# Patient Record
Sex: Female | Born: 1960 | Race: White | Hispanic: No | Smoking: Former smoker
Health system: Southern US, Community
[De-identification: ages and names within clinical notes are randomized; demographics above are authoritative.]

## PROBLEM LIST (undated history)

## (undated) DIAGNOSIS — E039 Hypothyroidism, unspecified: Secondary | ICD-10-CM

## (undated) DIAGNOSIS — F32A Depression, unspecified: Secondary | ICD-10-CM

## (undated) DIAGNOSIS — F3181 Bipolar II disorder: Secondary | ICD-10-CM

## (undated) DIAGNOSIS — M25511 Pain in right shoulder: Secondary | ICD-10-CM

## (undated) DIAGNOSIS — F319 Bipolar disorder, unspecified: Secondary | ICD-10-CM

## (undated) DIAGNOSIS — M542 Cervicalgia: Secondary | ICD-10-CM

## (undated) DIAGNOSIS — I10 Essential (primary) hypertension: Secondary | ICD-10-CM

## (undated) DIAGNOSIS — F419 Anxiety disorder, unspecified: Secondary | ICD-10-CM

## (undated) DIAGNOSIS — M25512 Pain in left shoulder: Secondary | ICD-10-CM

## (undated) DIAGNOSIS — K760 Fatty (change of) liver, not elsewhere classified: Secondary | ICD-10-CM

## (undated) DIAGNOSIS — R6889 Other general symptoms and signs: Secondary | ICD-10-CM

## (undated) DIAGNOSIS — K219 Gastro-esophageal reflux disease without esophagitis: Secondary | ICD-10-CM

## (undated) DIAGNOSIS — R740 Nonspecific elevation of levels of transaminase and lactic acid dehydrogenase [LDH]: Secondary | ICD-10-CM

## (undated) DIAGNOSIS — Z7689 Persons encountering health services in other specified circumstances: Secondary | ICD-10-CM

## (undated) DIAGNOSIS — G8929 Other chronic pain: Secondary | ICD-10-CM

## (undated) DIAGNOSIS — F329 Major depressive disorder, single episode, unspecified: Secondary | ICD-10-CM

## (undated) DIAGNOSIS — R7401 Elevation of levels of liver transaminase levels: Secondary | ICD-10-CM

## (undated) DIAGNOSIS — Z1331 Encounter for screening for depression: Secondary | ICD-10-CM

## (undated) DIAGNOSIS — K2970 Gastritis, unspecified, without bleeding: Secondary | ICD-10-CM

## (undated) HISTORY — DX: Pain in left shoulder: M25.512

## (undated) HISTORY — PX: OTHER SURGICAL HISTORY: SHX169

## (undated) HISTORY — DX: Bipolar disorder, unspecified: F31.9

## (undated) HISTORY — PX: NECK SURGERY: SHX720

## (undated) HISTORY — DX: Pain in right shoulder: M25.511

## (undated) HISTORY — DX: Hypothyroidism, unspecified: E03.9

## (undated) HISTORY — DX: Anxiety disorder, unspecified: F41.9

## (undated) HISTORY — DX: Gastritis, unspecified, without bleeding: K29.70

## (undated) HISTORY — DX: Other chronic pain: G89.29

## (undated) HISTORY — DX: Elevation of levels of liver transaminase levels: R74.01

## (undated) HISTORY — DX: Other general symptoms and signs: R68.89

## (undated) HISTORY — DX: Persons encountering health services in other specified circumstances: Z76.89

## (undated) HISTORY — DX: Nonspecific elevation of levels of transaminase and lactic acid dehydrogenase (ldh): R74.0

## (undated) HISTORY — DX: Bipolar II disorder: F31.81

## (undated) HISTORY — DX: Fatty (change of) liver, not elsewhere classified: K76.0

## (undated) HISTORY — DX: Cervicalgia: M54.2

## (undated) HISTORY — DX: Essential (primary) hypertension: I10

## (undated) HISTORY — DX: Encounter for screening for depression: Z13.31

---
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
## 2003-10-24 IMAGING — CR DG CHEST 2V
2 series · 2 of 2 positions shown · non-contrast
Comparison: none

CLINICAL DATA: Pre-op for ACDF.
 TWO VIEW CHEST   
 No prior studies for comparison.
 The heart size and contour are within normal limits.  Indistinct right heart border on the PA film is likely due to chronic change.  No active disease apparent on the lateral view.  
 The lungs are hyperaerated.  
 IMPRESSION
 Chronic lung changes ? no acute process.

[view not recorded (1 of 2)]
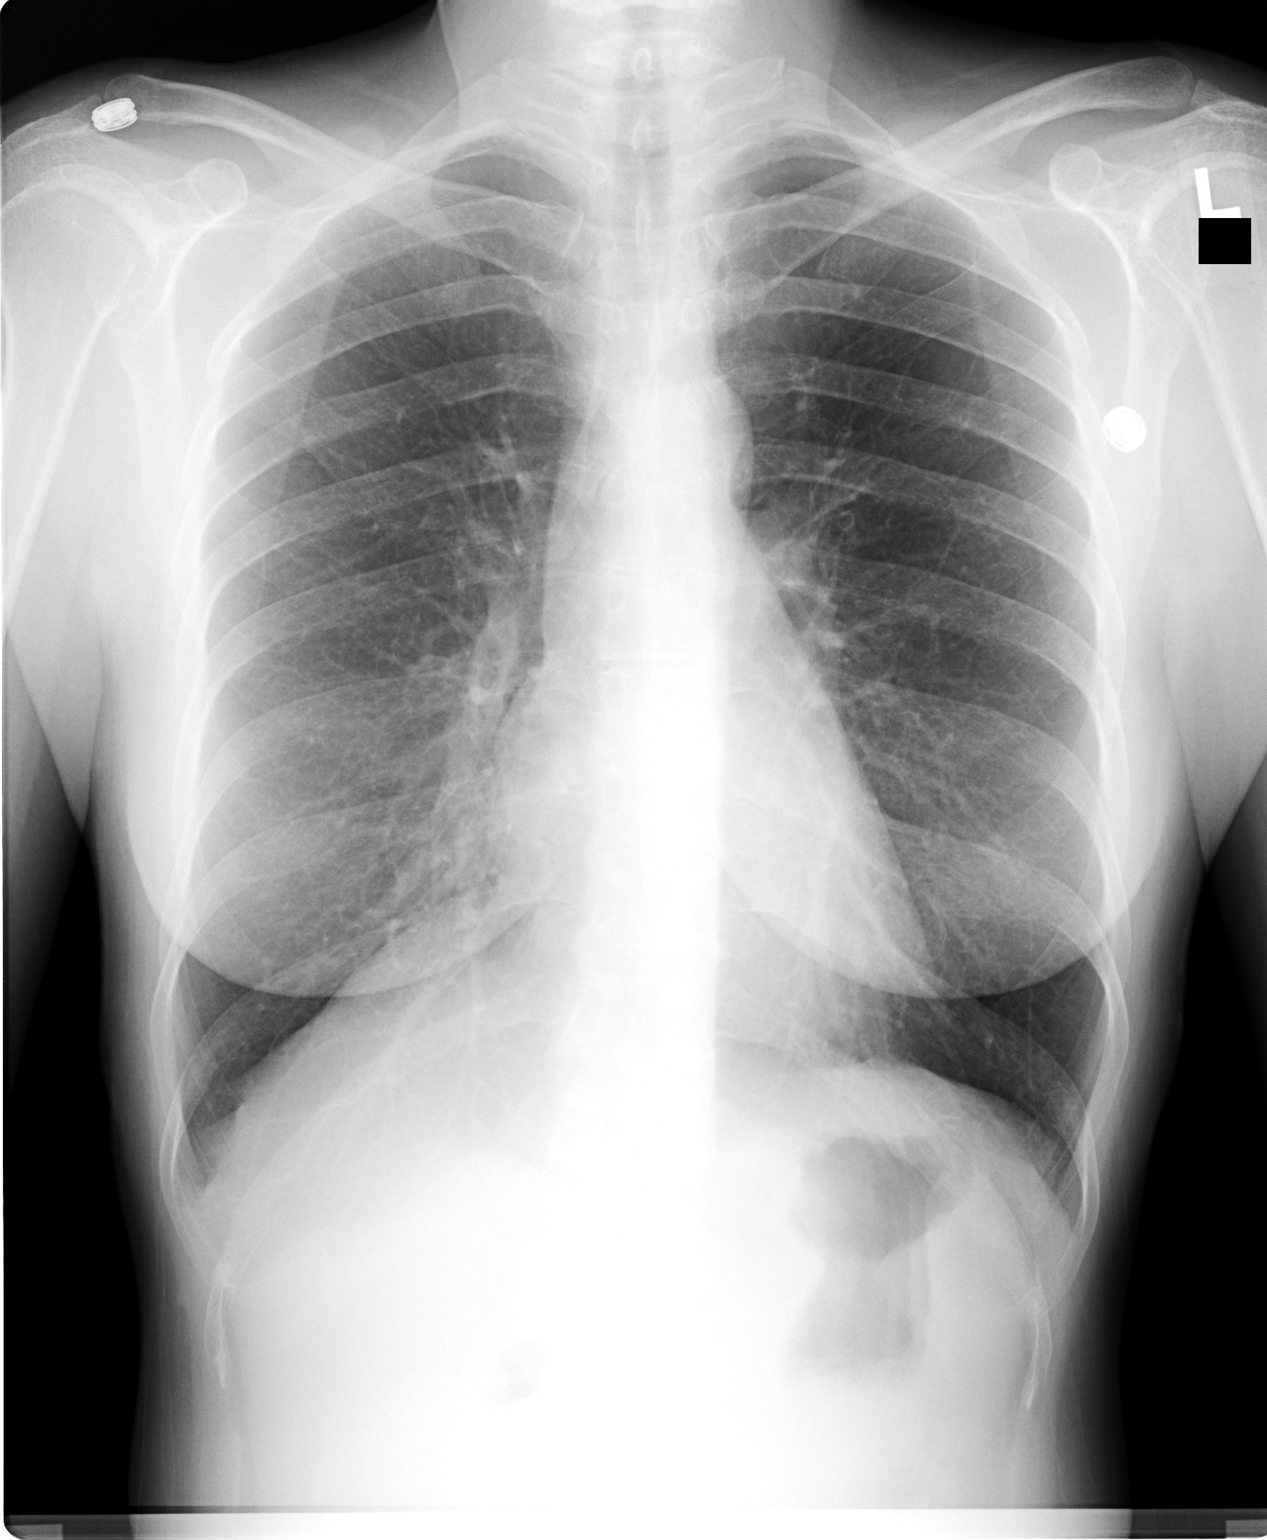

[view not recorded (2 of 2)]
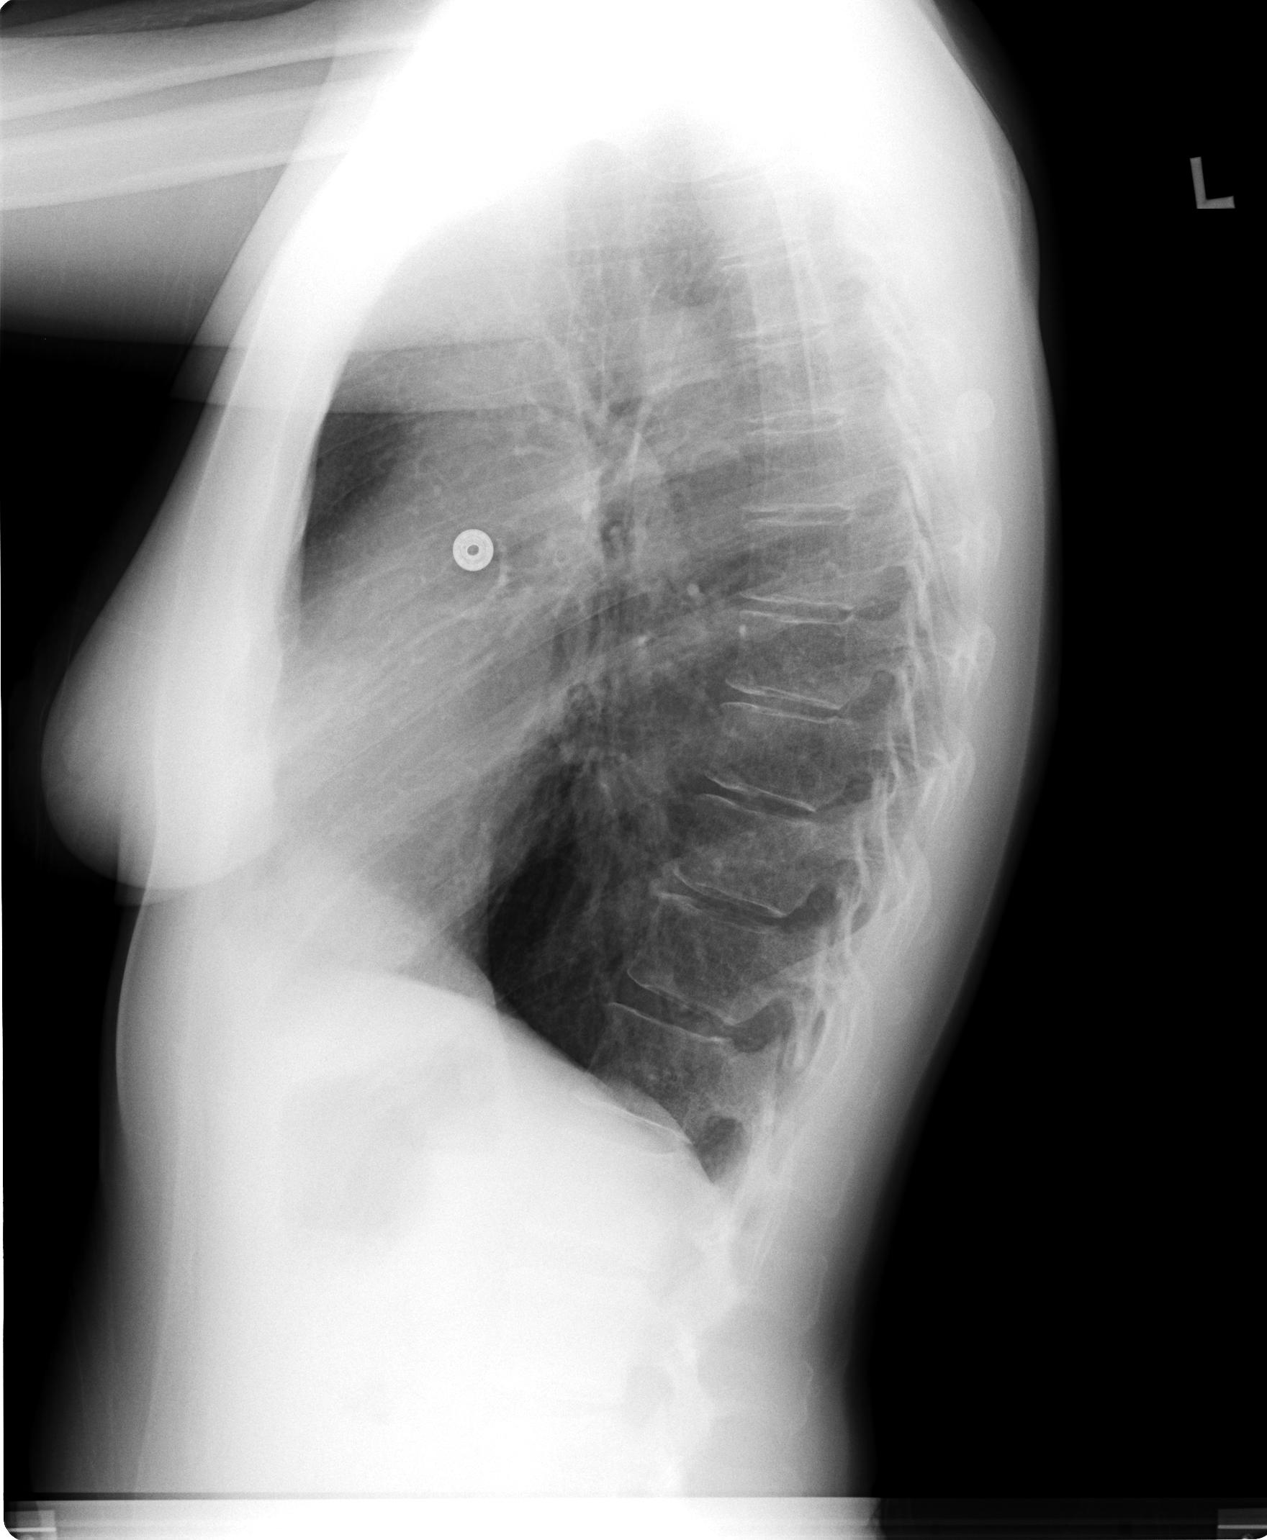

[2 of 2 positions shown; findings below may reference images not displayed]

## 2003-10-28 ENCOUNTER — Ambulatory Visit (HOSPITAL_COMMUNITY): Admission: RE | Admit: 2003-10-28 | Discharge: 2003-10-29 | Payer: Self-pay | Admitting: Neurological Surgery

## 2003-12-14 ENCOUNTER — Encounter: Admission: RE | Admit: 2003-12-14 | Discharge: 2003-12-14 | Payer: Self-pay | Admitting: Neurological Surgery

## 2003-12-14 IMAGING — CR DG CERVICAL SPINE 2 OR 3 VIEWS
2 series · 2 of 2 positions shown · non-contrast
Comparison: none

CLINICAL DATA: Cervical discectomy ? follow up. 
 CERVICAL SPINE TWO VIEWS: 
 Two views of the cervical spine are compared to an intraoperative film of [DATE].  Anterior discectomy and fusion has been performed at C4-5 and C5-6.  The interbody fusion plugs are unchanged in appearance and the metallic fixation plate is unchanged in position.  No significant prevertebral soft tissue swelling is seen.  Normal alignment is maintained.

[view not recorded (1 of 2)]
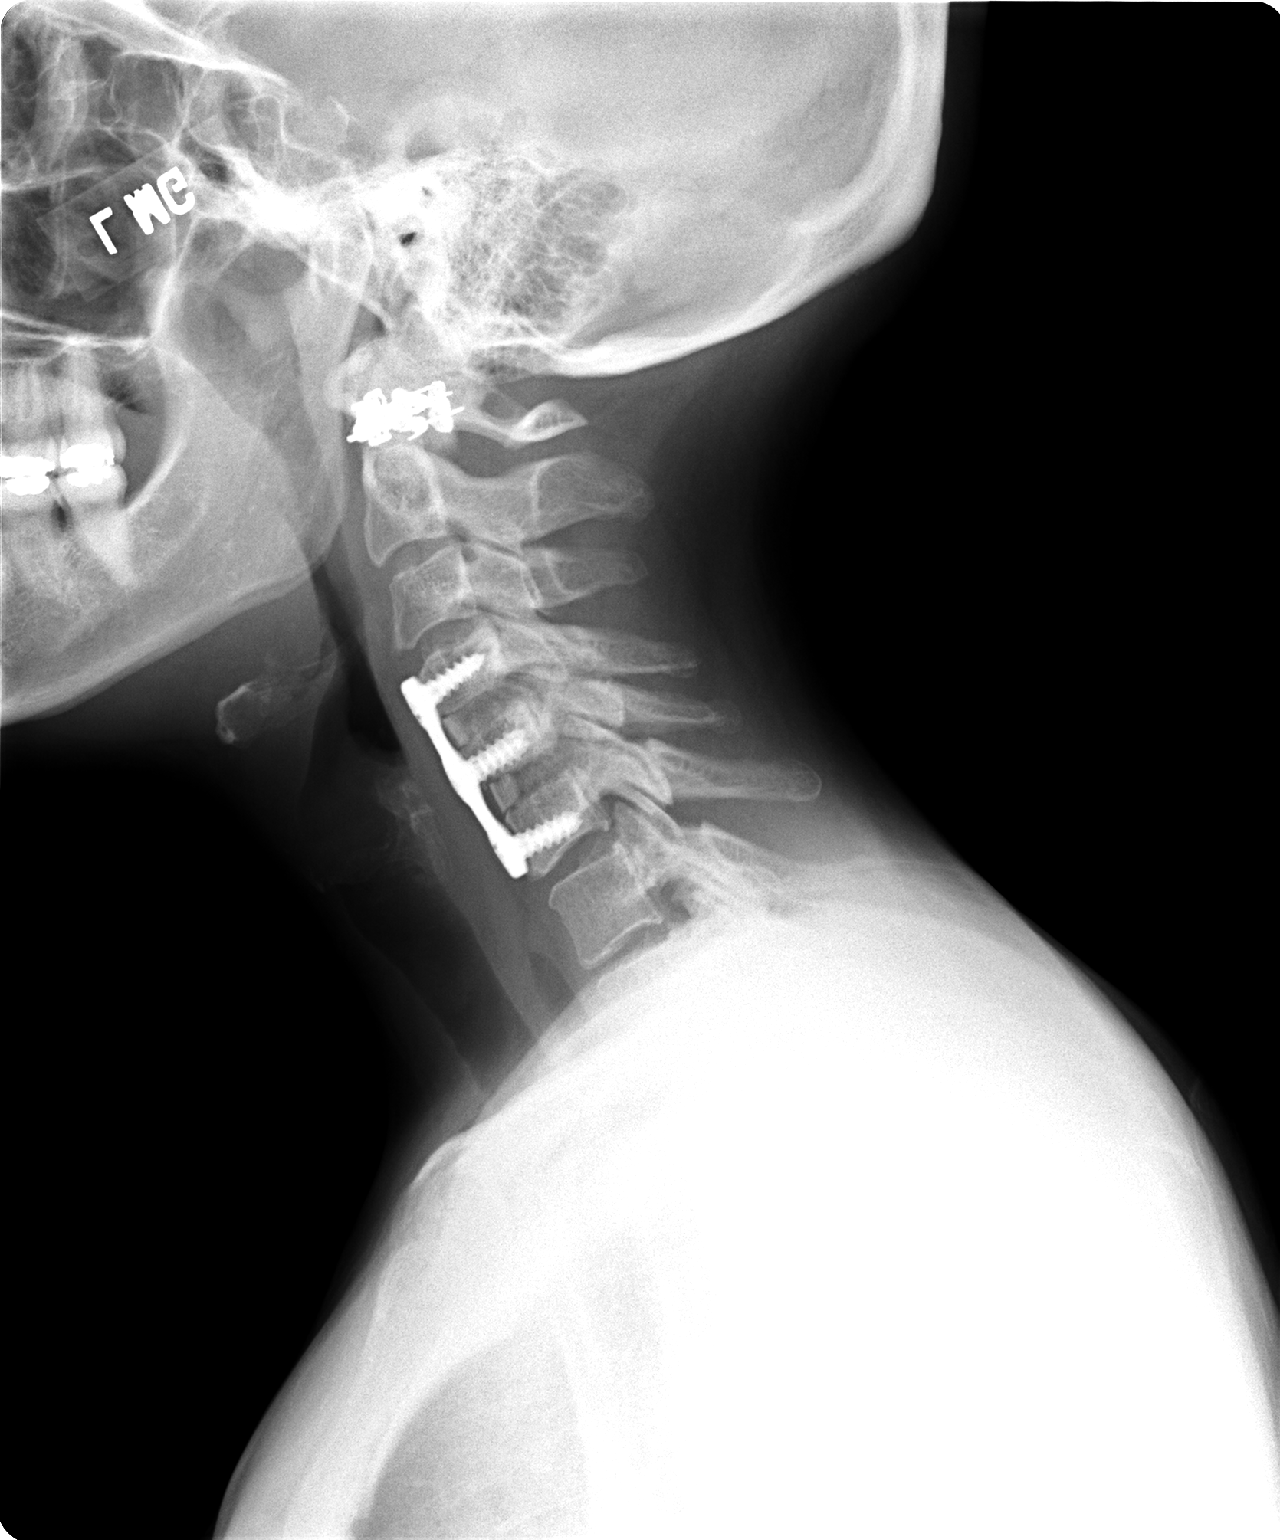

[view not recorded (2 of 2)]
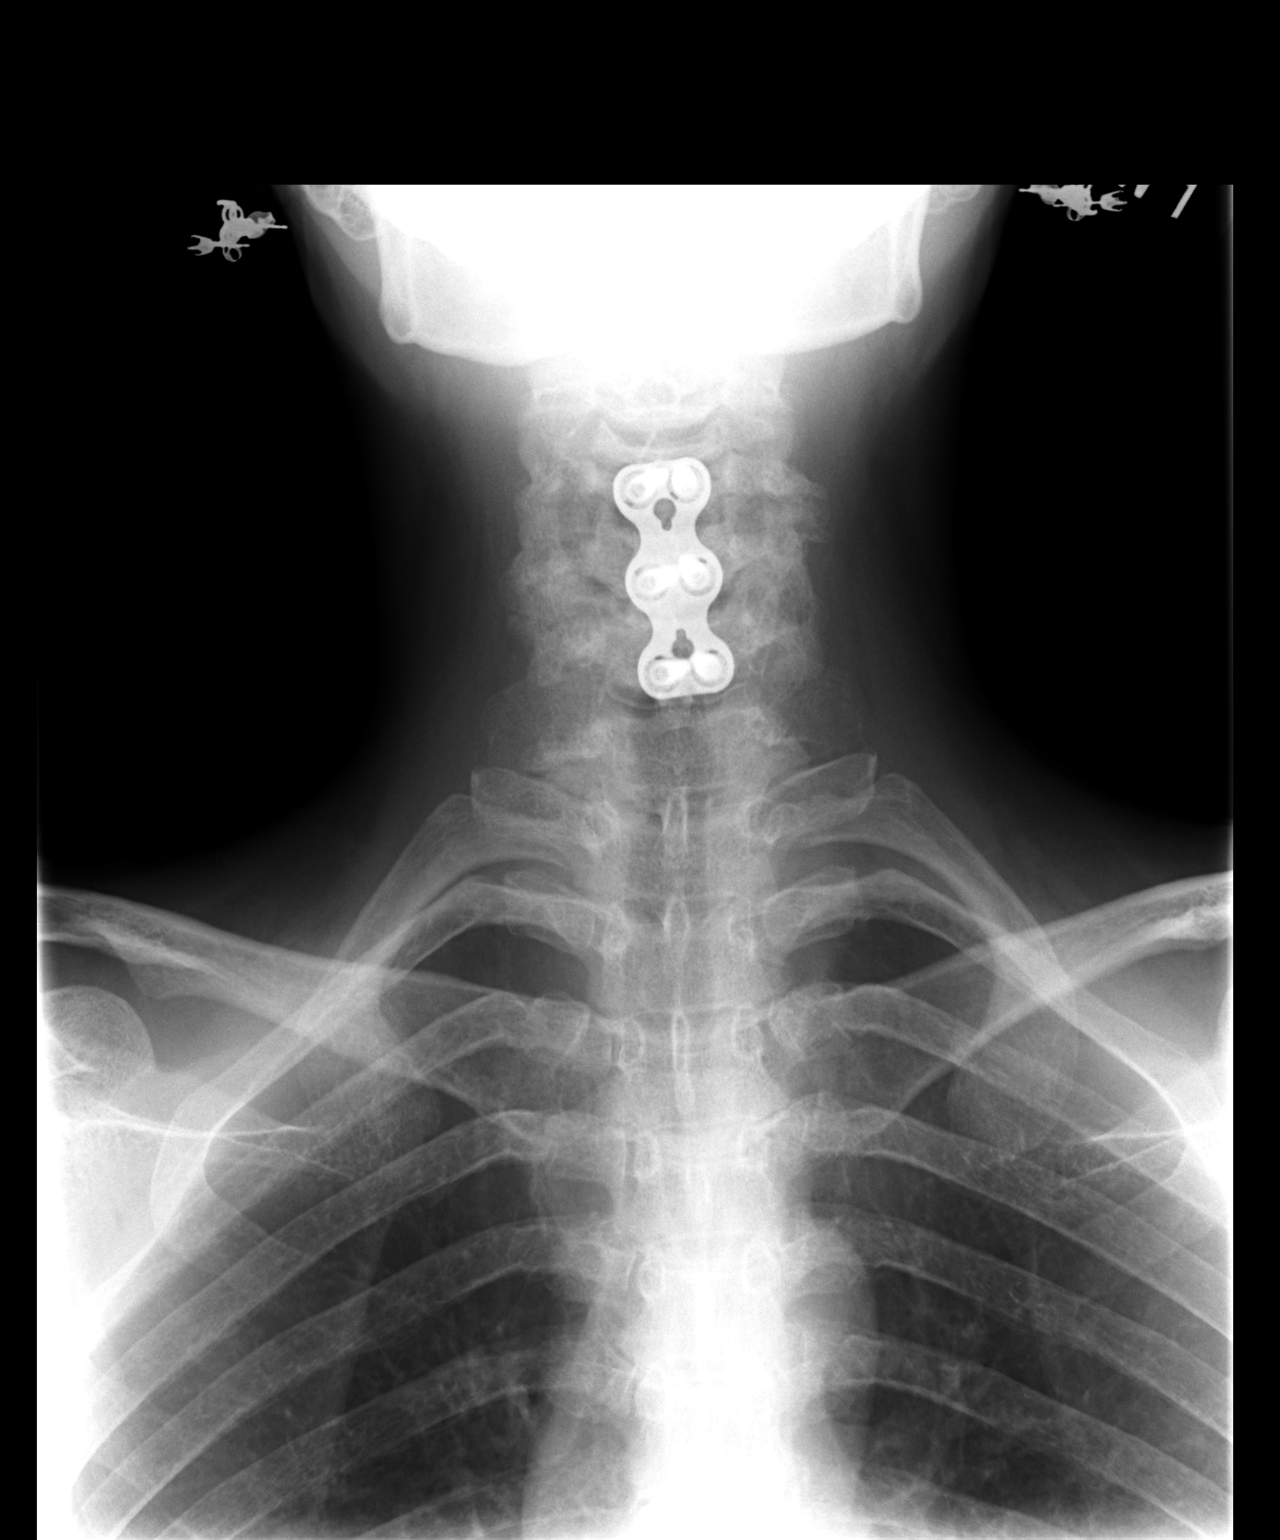

[2 of 2 positions shown; findings below may reference images not displayed]

IMPRESSION: No significant change in anterior fusion C4-5 and C5-6.

## 2004-02-10 ENCOUNTER — Ambulatory Visit: Payer: Self-pay | Admitting: Neurological Surgery

## 2005-10-01 HISTORY — PX: NECK SURGERY: SHX720

## 2007-07-08 ENCOUNTER — Ambulatory Visit: Payer: Self-pay | Admitting: Family Medicine

## 2007-07-08 IMAGING — MG MAM DGTL SCREENING MAMMO W/CAD
1 series · 4 of 4 positions shown · non-contrast
Comparison: none

REASON FOR EXAM: scr mammo
COMMENTS:

[Series 9652: R CC · right · 4 of 4 slices shown]
[im 1/4]
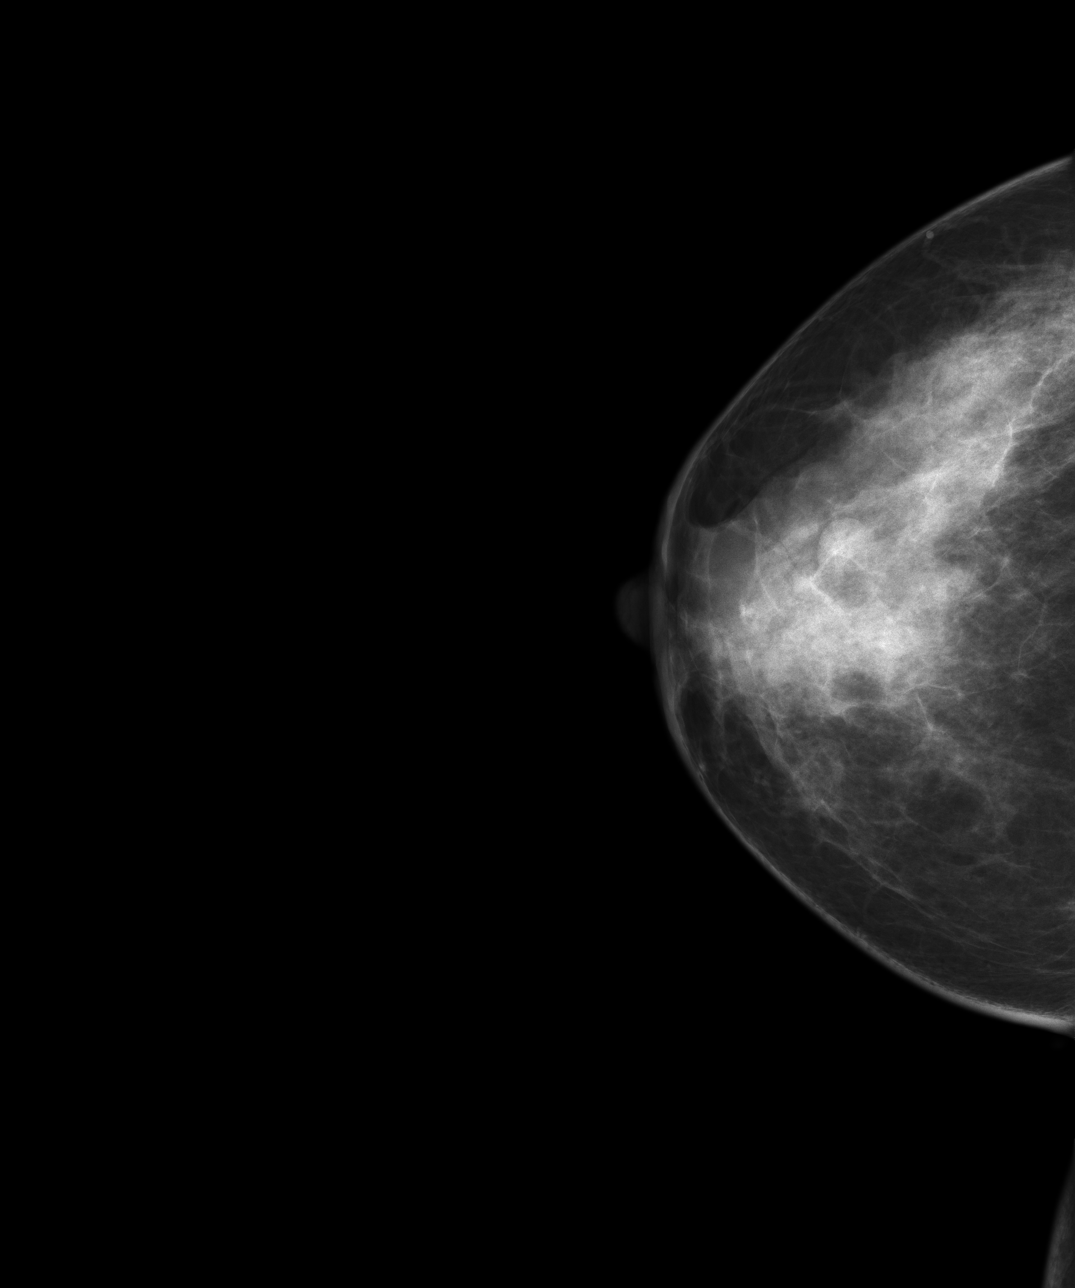
[im 2/4]
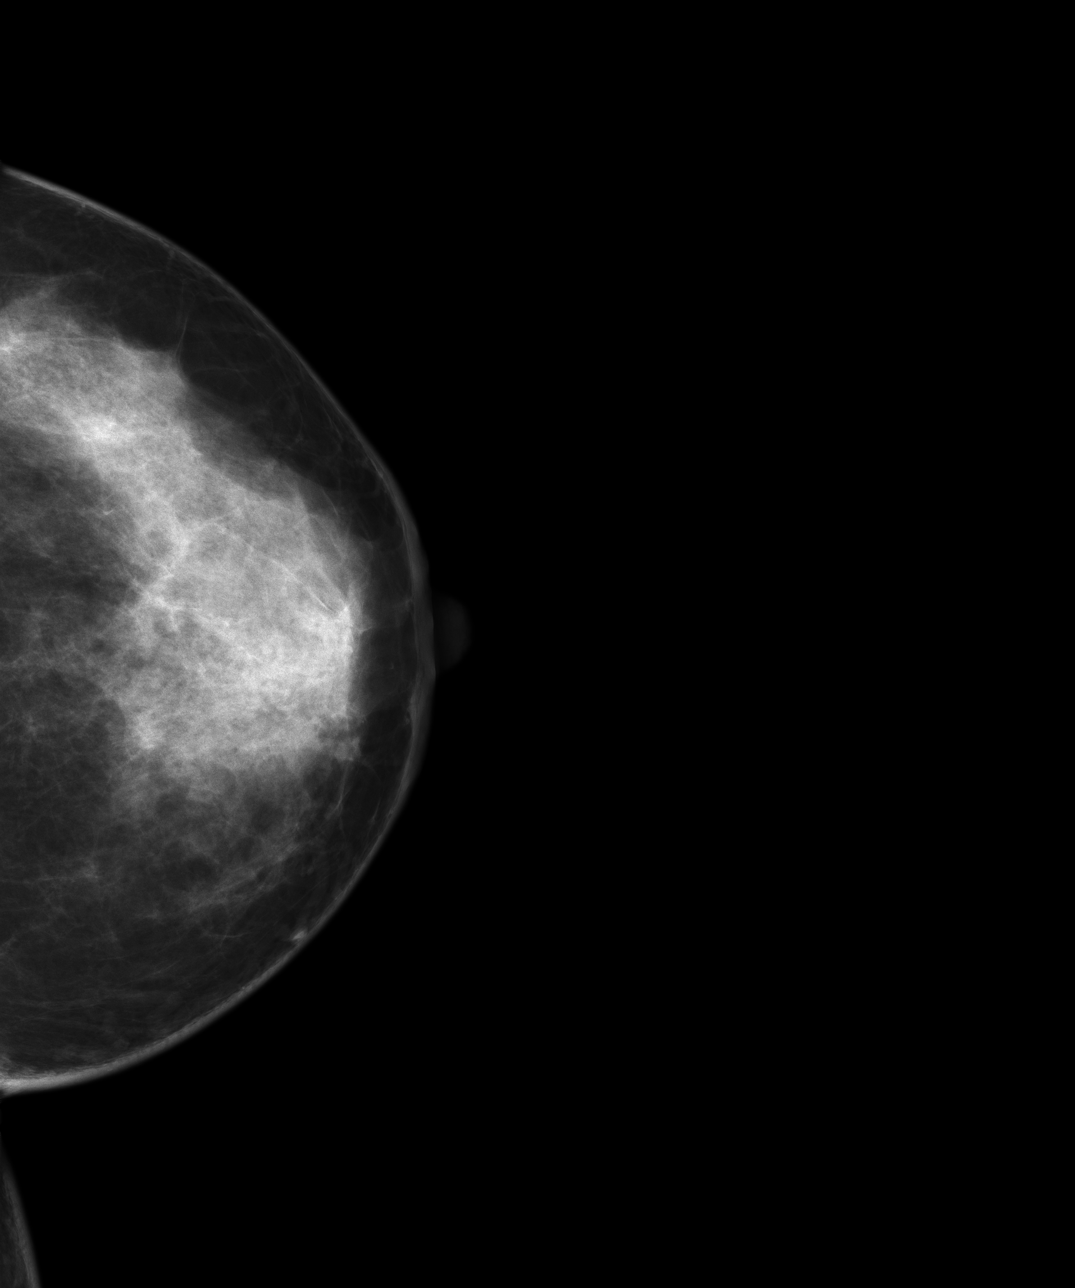
[im 3/4]
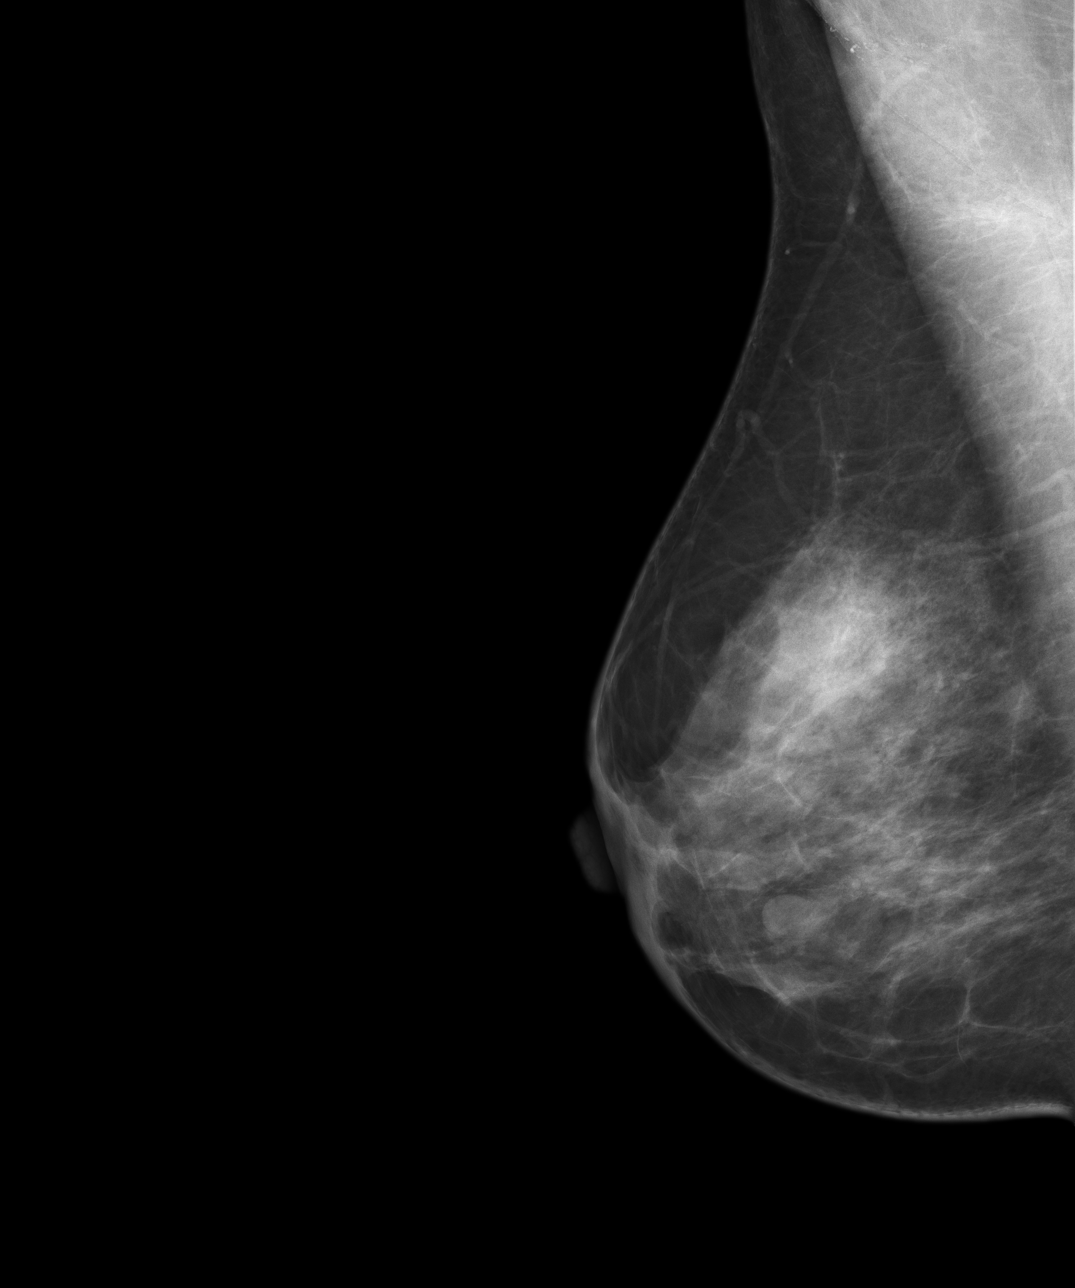
[im 4/4]
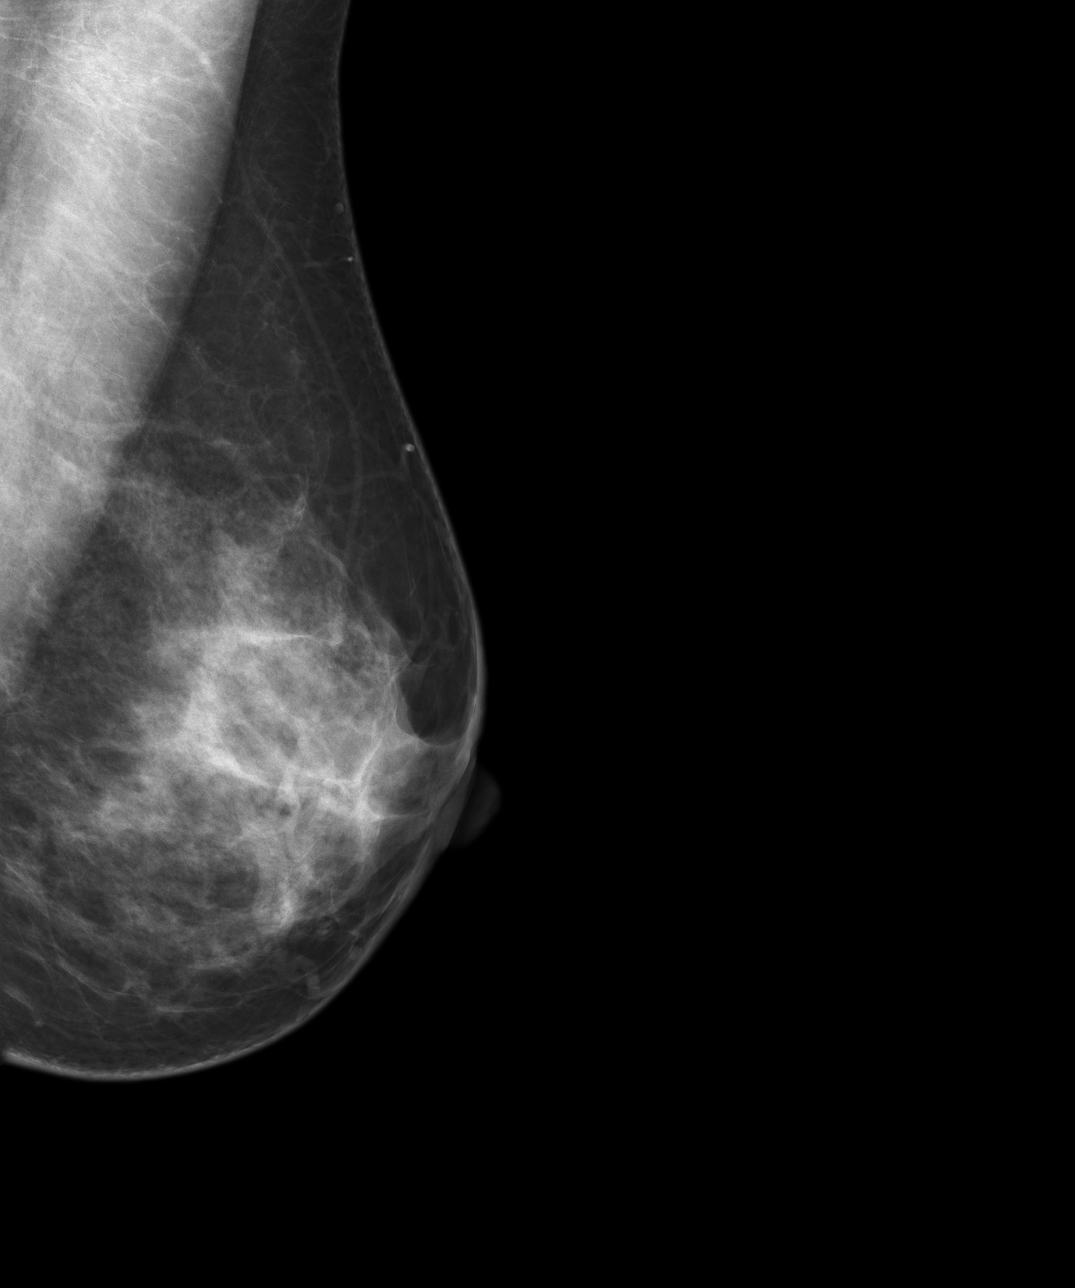

[4 of 4 positions shown; findings below may reference images not displayed]

PROCEDURE:     MAM - MAM DGTL SCREENING MAMMO W/CAD  - [DATE]  [DATE]

RESULT:       This is a baseline study.

The breasts exhibit a symmetric moderately dense parenchymal pattern.  There
are smoothly marginated nodules noted in both breasts.  On the RIGHT this is
seen in two views and lies at approximately the [DATE] to 7 o'clock position.
On the LEFT, the nodule is demonstrated inferiorly but is difficult to
demonstrate clearly on the CC view.  It likely lies slightly laterally.
IMPRESSION: 1.     This baseline study reveals a moderately dense nodular parenchymal
pattern.  I do not see findings suspicious for malignancy.  There are likely
fibroglandular elements responsible for the nodularity present.
2.     BI-RADS: Category 3- Probably Benign Finding (interval follow up).
Bilateral follow up 6-month mammogram of both breasts is recommended in an
effort to establish a stable normal baseline.

A NEGATIVE MAMMOGRAM REPORT DOES NOT PRECLUDE BIOPSY OR OTHER EVALUATION OF
A CLINICALLY PALPABLE OR OTHERWISE SUPSICIOUS MASS OR LESION.  BREAST CANCER
MAY NOT BE DETECTED BY MAMMOGRAPHY IN UP TO 10% OF CASES.

## 2008-02-16 ENCOUNTER — Encounter: Admission: RE | Admit: 2008-02-16 | Discharge: 2008-02-16 | Payer: Self-pay | Admitting: Neurological Surgery

## 2008-02-16 IMAGING — CT CT CERVICAL SPINE W/O CM
5 series · 16 of 33 positions shown, 18 images · non-contrast
Comparison: [DATE] radiographs

CLINICAL DATA: Neck pain.  Bilateral shoulder pain.  Fusion [X5]

CT CERVICAL SPINE WITHOUT CONTRAST
TECHNIQUE: Multidetector CT imaging of the cervical spine was
performed. Multiplanar CT image reconstructions were also
generated.

[Series 3: c spine · axial · 0.23mm/px · z∈[+124,+181]mm · 2 of 136 slices shown]
[im 46/136  bone]
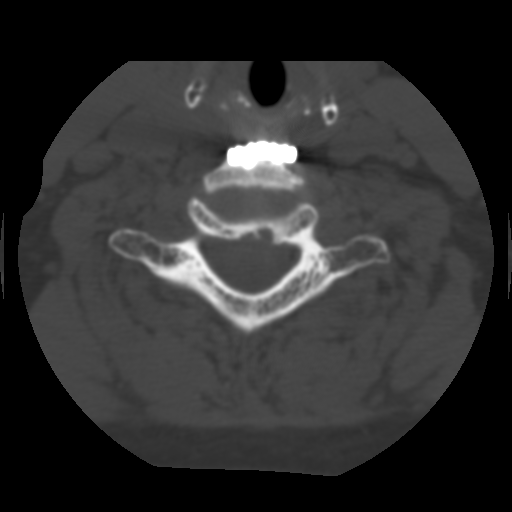
[im 91/136  bone]
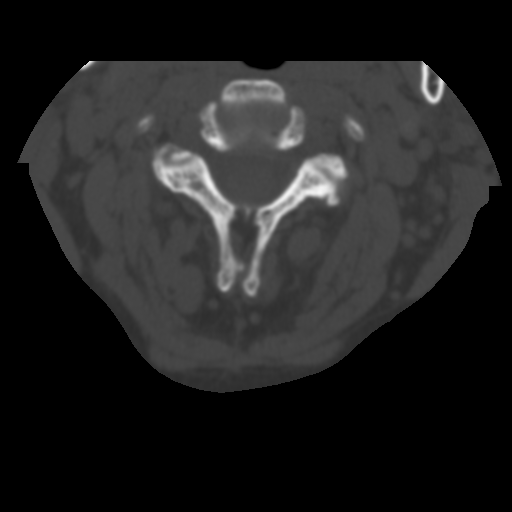

[Series 4: bone windows · axial · 0.23mm/px · z∈[+124,+181]mm · 2 of 136 slices shown]
[im 46/136  bone]
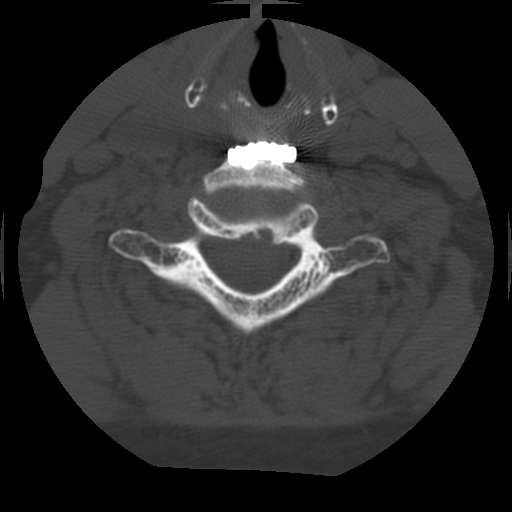
[im 91/136  bone]
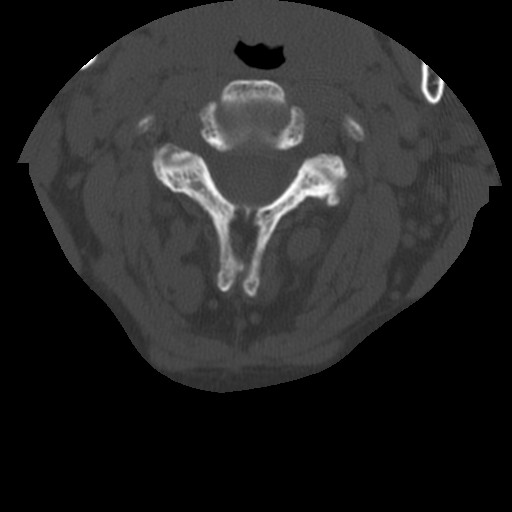

[Series 200: coronal · coronal · 0.34mm/px · 3 of 40 slices shown]
[im 8/40  bone]
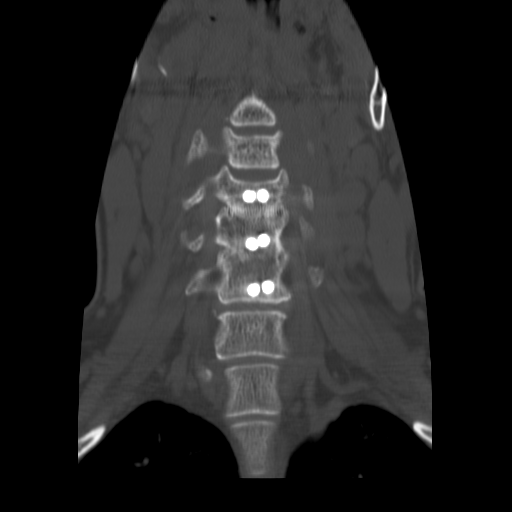
[im 16/40  bone]
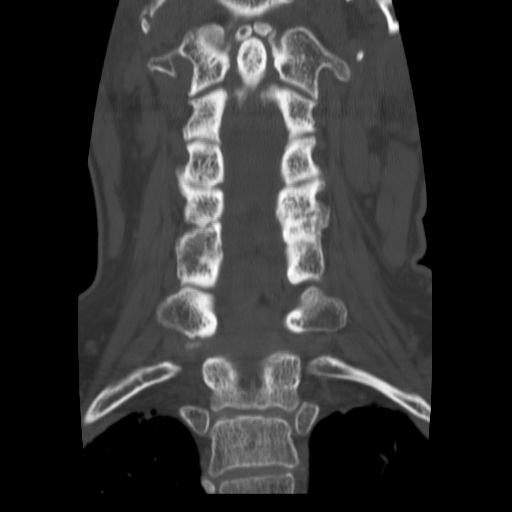
[im 24/40  bone]
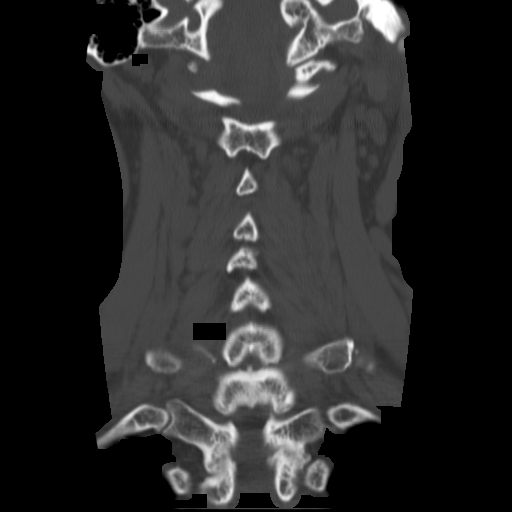

[Series 201: sagittal · sagittal · 0.34mm/px · 5 of 38 slices shown, 6 images]
[im 13/38  bone]
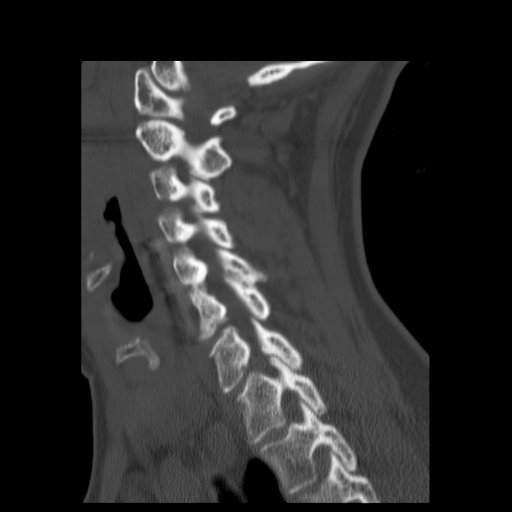
[im 16/38  bone]
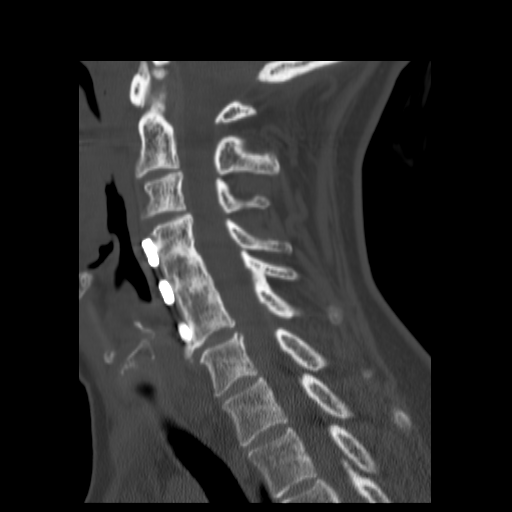
[im 19/38  soft-tissue]
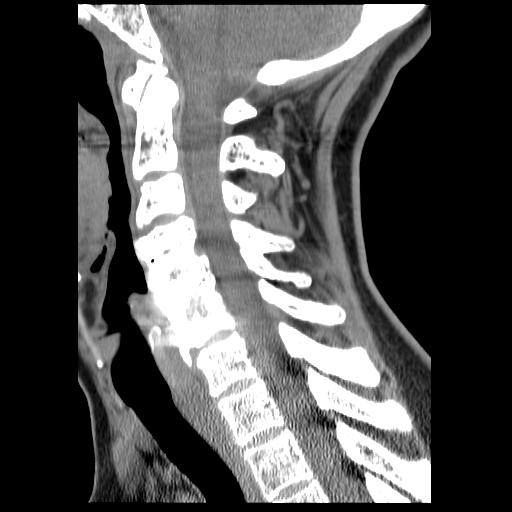
[im 19/38  bone]
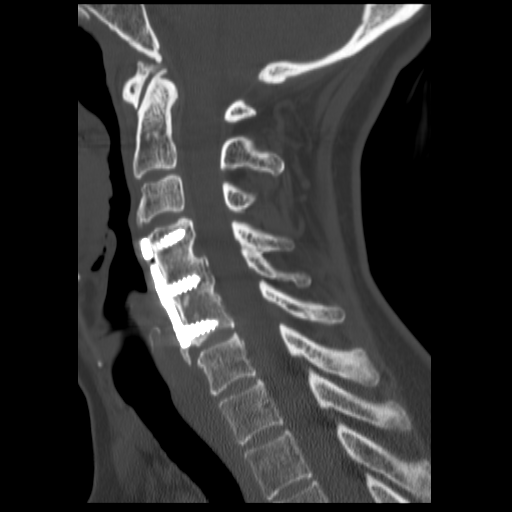
[im 22/38  bone]
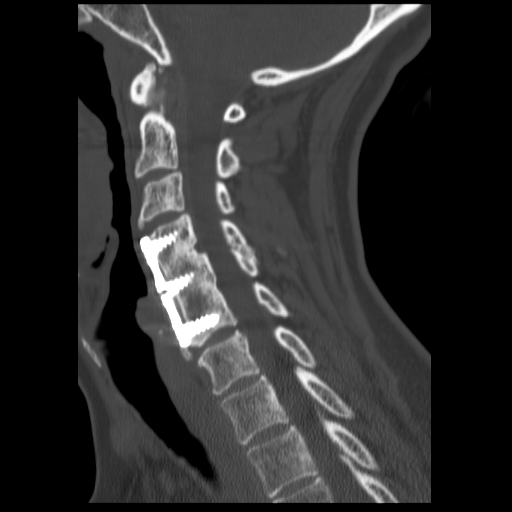
[im 25/38  bone]
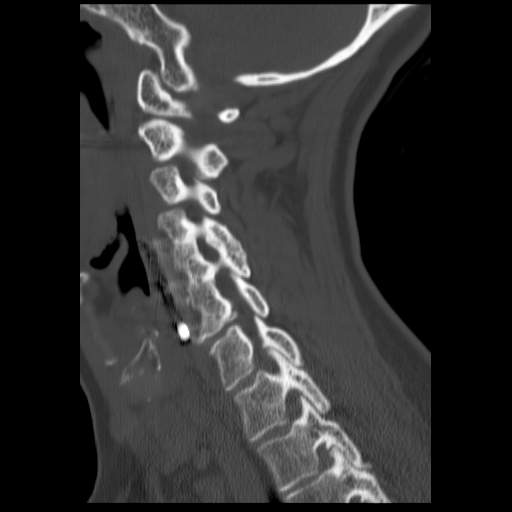

[Series 202: angled axial · axial · 0.20mm/px · z∈[+88,+182]mm · 4 of 230 slices shown, 5 images]
[im 46/230  soft-tissue]
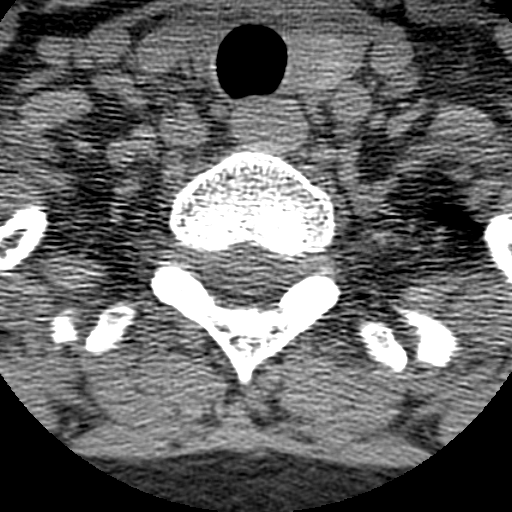
[im 46/230  bone]
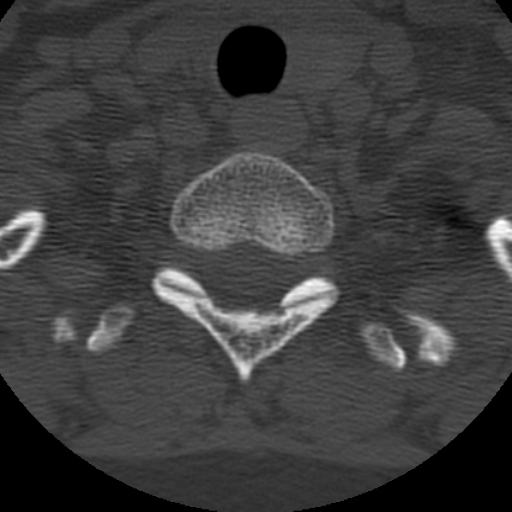
[im 92/230  bone]
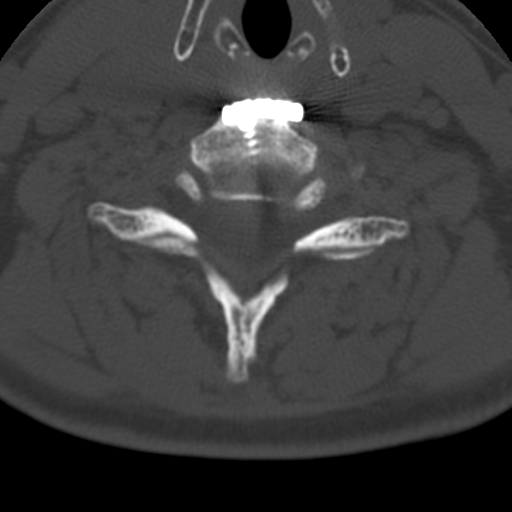
[im 138/230  bone]
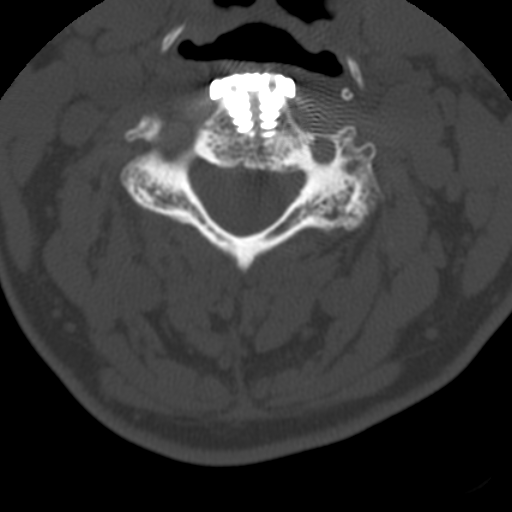
[im 184/230  bone]
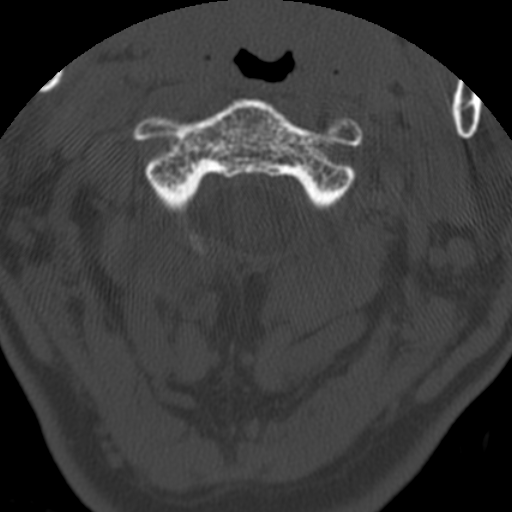

[16 of 33 positions shown; findings below may reference images not displayed]

FINDINGS: The foramen magnum is widely patent.  There are ordinary
degenerative changes between the anterior arch of C1 and the dens.
No encroachment upon the neural spaces.

At C2-3, there is mild uncovertebral prominence and there is mild
facet degeneration on the left.  No significant stenosis is
suspected.

At C3-4, there is facet arthropathy right worse than left.  There
is uncovertebral degeneration right worse than left.  The central
canal is widely patent.  There is foraminal encroachment on the
right that could possibly irritate the C4 nerve root.

From C4-C6, the patient has had previous anterior cervical
discectomy and fusion.  Fusion appears solid.  The canal and
foramina appear sufficiently patent.

At C6-7, there is mild spondylosis with small uncovertebral
osteophytes but no significant foraminal encroachment suspected.
No facet arthropathy.

At C7-T1, there is bilateral facet degeneration, worse on the right
in the left, but no slippage.  There is mild foraminal encroachment
bilaterally, not grossly compressive.
IMPRESSION: Good appearance of the fusion segment from C4-C6

Facet arthropathy is C3-4 right worse than left.  Mild
uncovertebral prominence.  Mild foraminal narrowing on the right
that could possibly affect the C4 nerve root.

Mild spondylosis C6-7 without gross compressive stenosis

Facet arthropathy at C7-T1 with mild foraminal encroachment, not
apparently compressive.

## 2008-09-02 ENCOUNTER — Emergency Department: Payer: Self-pay | Admitting: Emergency Medicine

## 2009-06-21 ENCOUNTER — Ambulatory Visit: Payer: Self-pay

## 2009-06-21 IMAGING — MG MAM DGTL SCREENING MAMMO W/CAD
1 series · 4 of 4 positions shown · non-contrast
Comparison: none

REASON FOR EXAM: SCREENING
COMMENTS:

[Series 5283: R CC · right · 4 of 4 slices shown]
[im 1/4]
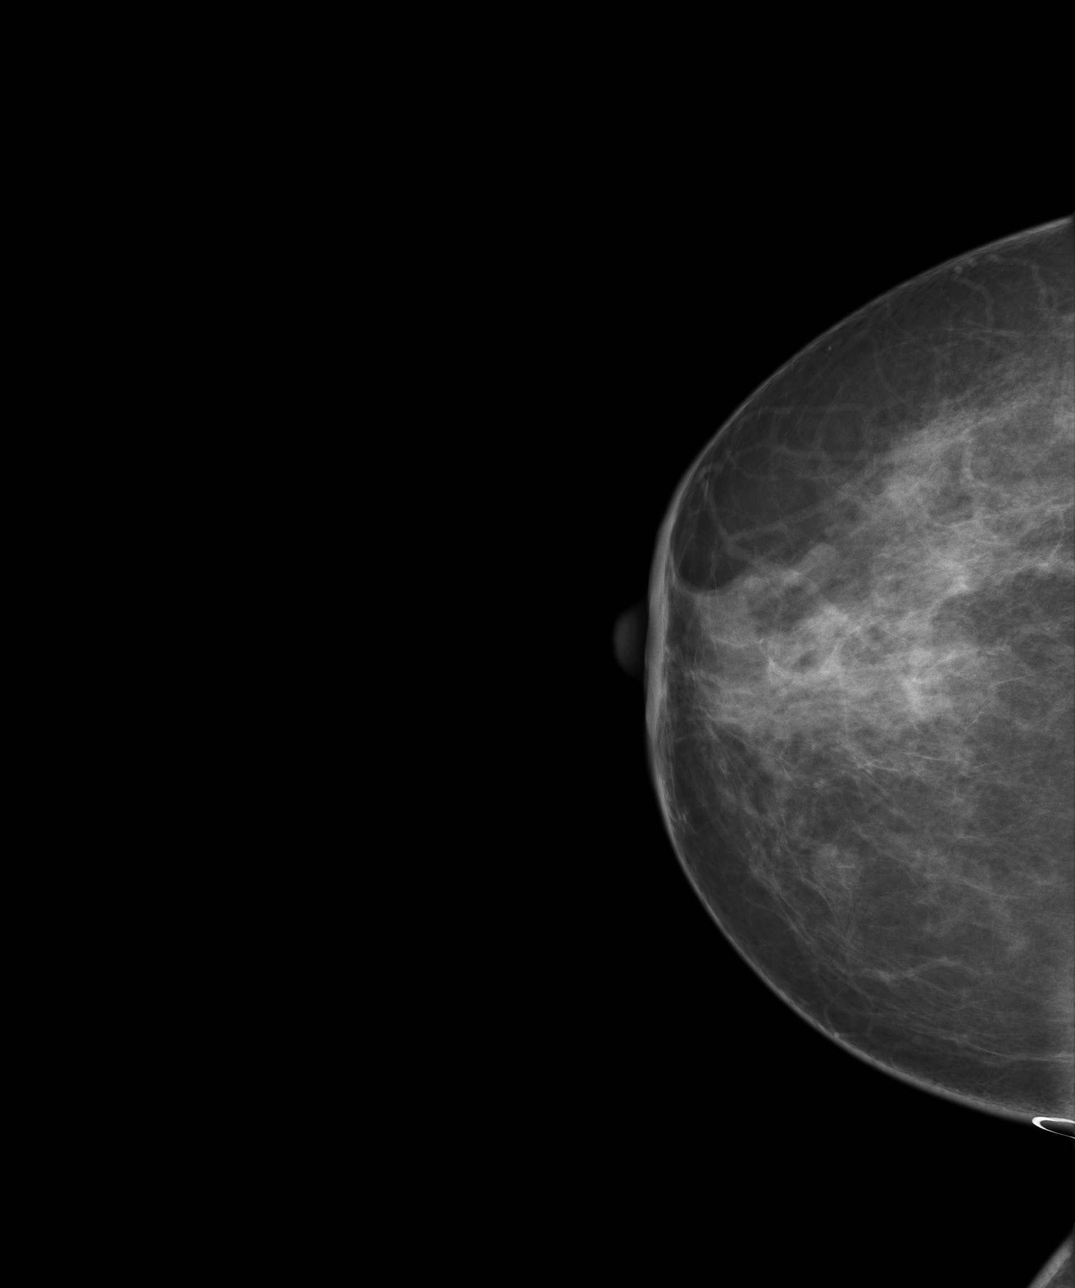
[im 2/4]
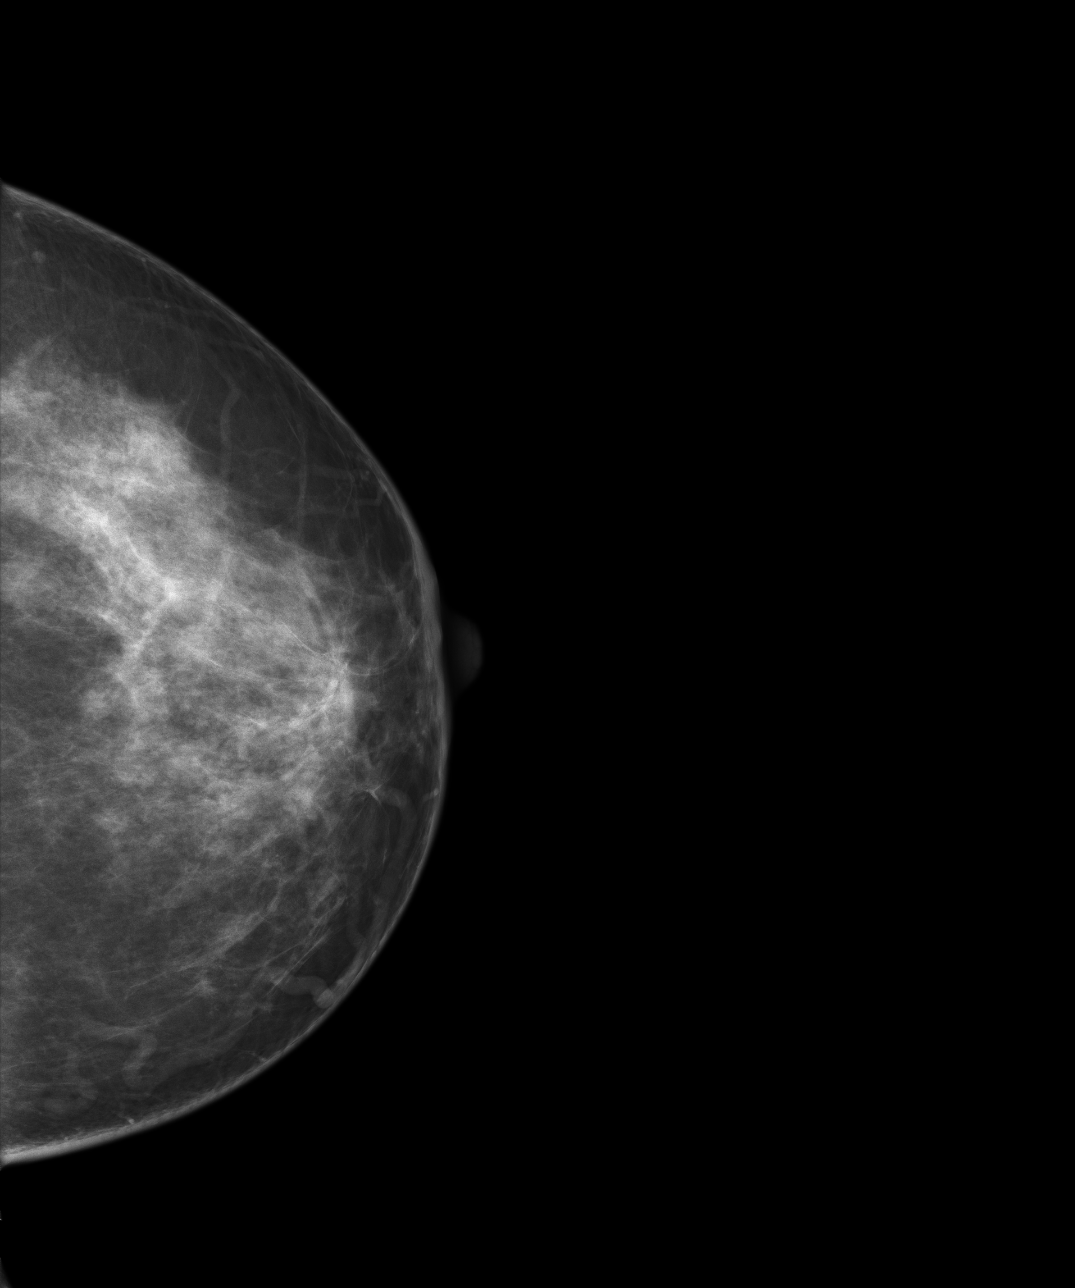
[im 3/4]
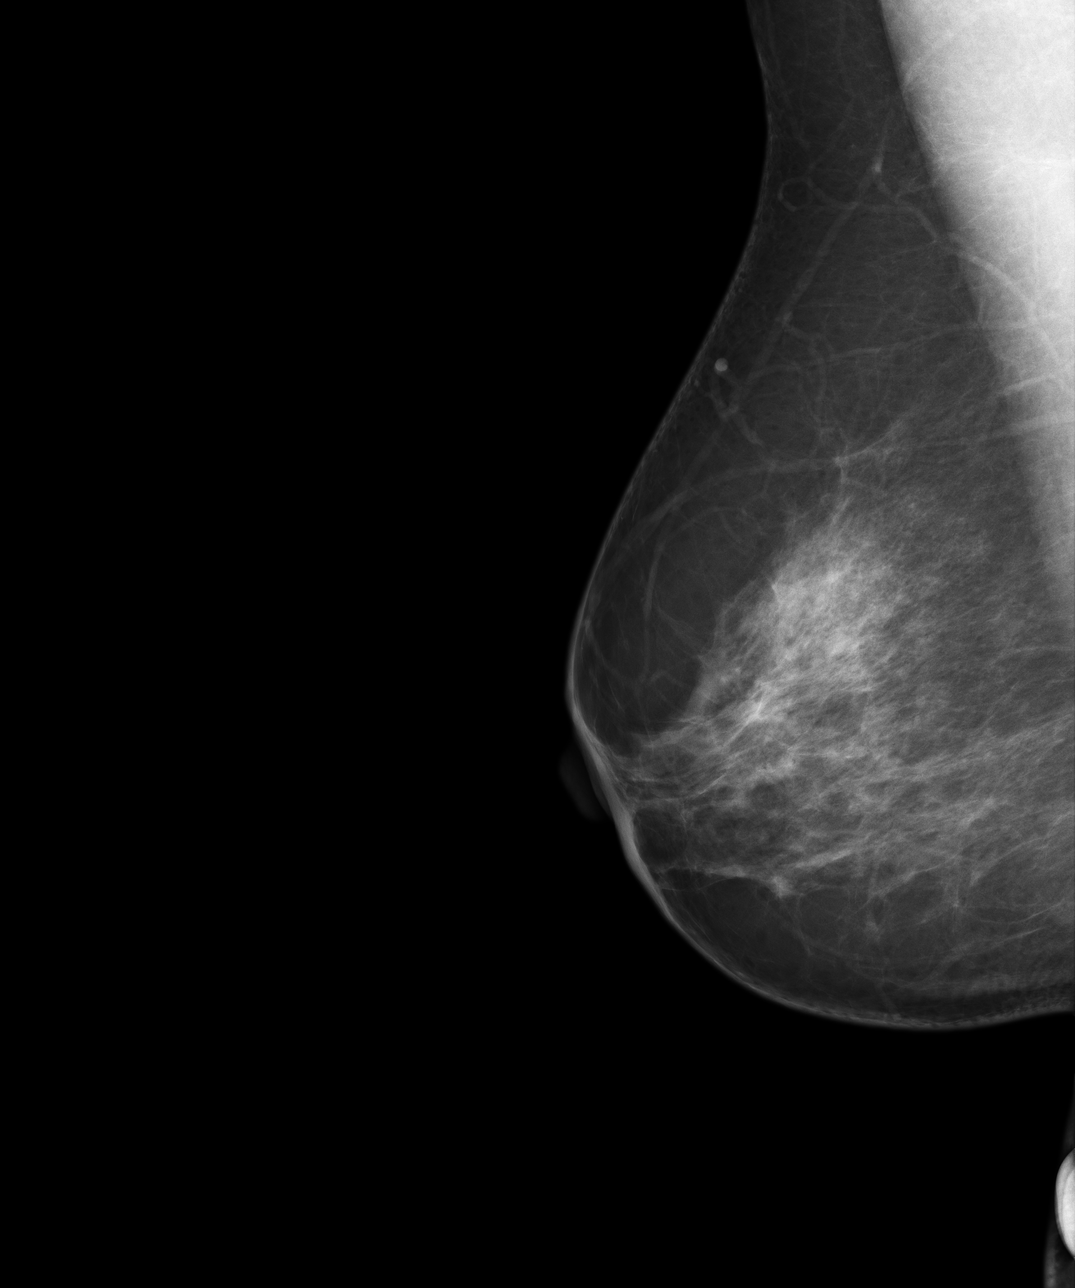
[im 4/4]
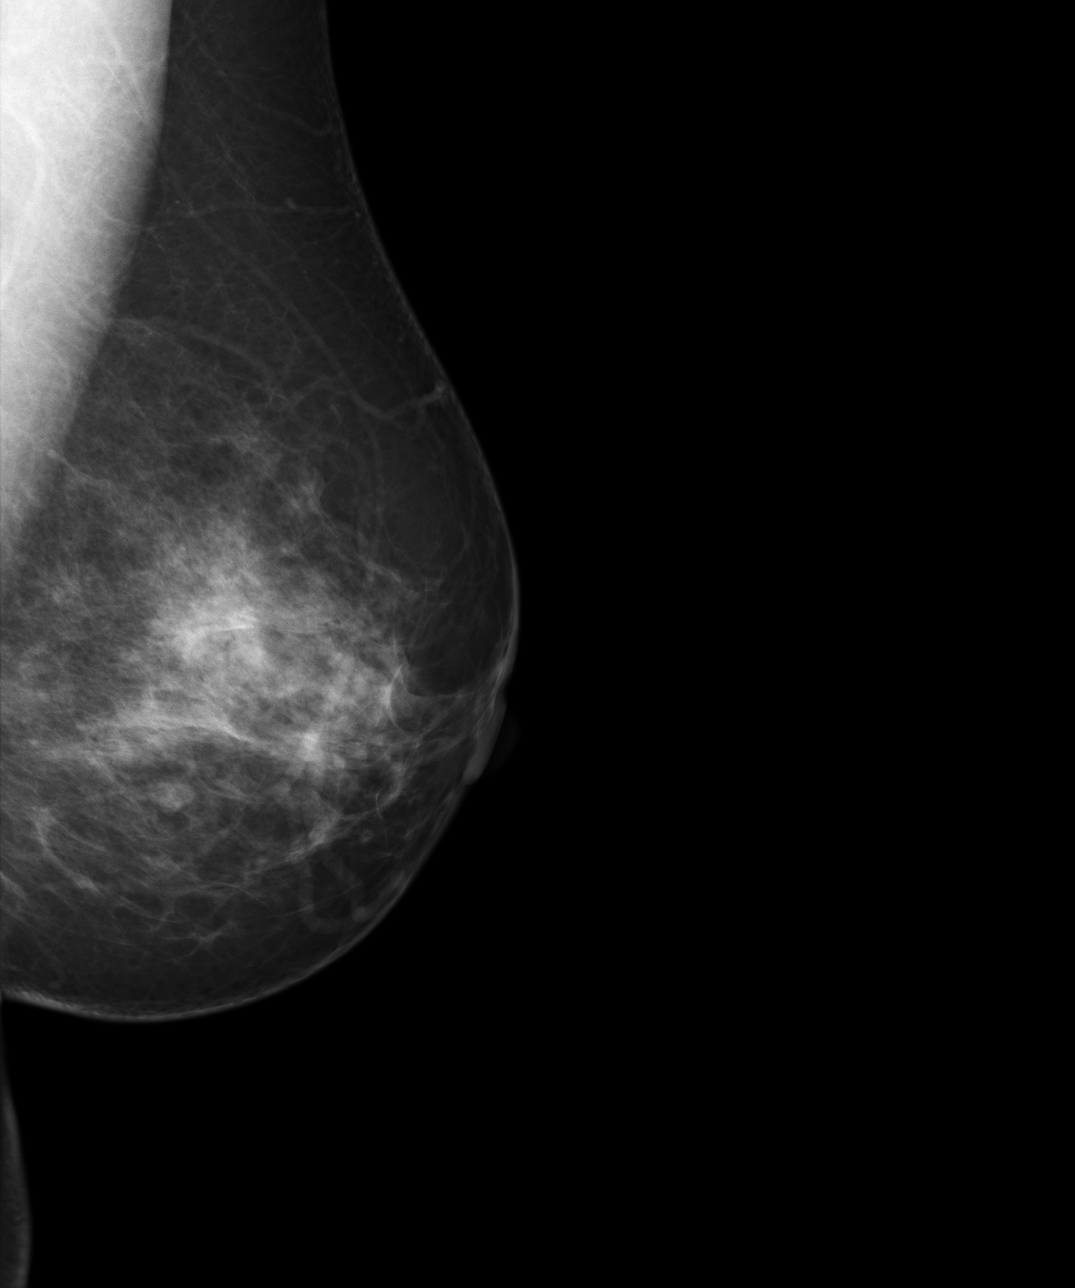

[4 of 4 positions shown; findings below may reference images not displayed]

PROCEDURE:     MAM - MAM DGTL SCREENING MAMMO W/CAD  - [DATE]  [DATE]

RESULT:     Comparison is made to study [DATE]. It has been
recommended that the patient return for six month follow-up mammography in
an effort to establish a stable baseline.  However, this was not done.

The breasts exhibit a moderately dense parenchymal pattern. There is no
dominant mass. There are no malignant appearing groupings of
microcalcification. Nodularity slightly inferiorly on the right on the
previous study, which was found to be lateral in location on the CC view is
less conspicuous today.
IMPRESSION: 1.I do not see findings suspicious for malignancy.

BI-RADS: Category 2 - Benign Findings

RECOMMENDATIONS:

1.     Please continue to encourage yearly mammographic follow-up.

A NEGATIVE MAMMOGRAM REPORT DOES NOT PRECLUDE BIOPSY OR OTHER EVALUATION OF
A CLINICALLY PALPABLE OR OTHERWISE SUSPICIOUS MASS OR LESION. BREAST CANCER
MAY NOT BE DETECTED BY MAMMOGRAPHY IN UP TO 10% OF CASES.

## 2010-09-14 NOTE — Op Note (Signed)
NAME:  Anna Giles, Anna Giles                         ACCOUNT NO.:  [id number]   MEDICAL RECORD NO.:  [id number]                   PATIENT TYPE:  OIB   LOCATION:  2890                                 FACILITY:  [facility]   PHYSICIAN:  Tia Alert, MD                  DATE OF BIRTH:  06-10-1960   DATE OF PROCEDURE:  10/28/2003  DATE OF DISCHARGE:                                 OPERATIVE REPORT   PREOPERATIVE DIAGNOSIS:  Cervical spondylosis with neck pain and right arm  pain.   POSTOPERATIVE DIAGNOSIS:  Cervical spondylosis with neck pain and right arm  pain.   PROCEDURE:  1. Decompressive anterior cervical diskectomy of C4-5 and C5-6 for central     canal and nerve root decompression.  2. Anterior cervical arthrodesis, C4-5 and C5-6, utilizing 6 mm Tutogen     allograft.  3. Anterior cervical plating, C4-5 and C5-6, utilizing a 38 mm Accufix     plate.   SURGEON:  Tia Alert, MD   ASSISTANT:  Mardelle Matte __________ .   ANESTHESIA:  General endotracheal.   COMPLICATIONS:  None apparent.   INDICATIONS FOR PROCEDURE:  Ms. Knieriem is a 50 year old white female who  is seen in the neurosurgery clinic with complaints of neck pain.  This was a  work-related injury.  She complained also of right arm pain, right  interscapular pain and right latissimus dorsi pain.  She had an MRI and a CT  myelogram which showed significant facet arthrosis at C4-5 and C5-6 with  neural foraminal narrowing.  She tried medical management for quite some  time with no significant relief, including cervical epidural steroid  injections.  I recommended an anterior cervical diskectomy with fusion of  plating at C4-5 and C5-6.  She understood the risks, benefits and  alternatives and wished to proceed.   DESCRIPTION OF PROCEDURE:  The patient was taken to the operating room and  after induction of general endotracheal anesthesia, she was placed in a  supine position on the operating room table.  Her right  anterior cervical  region was prepped with Duraprep and then draped in the usual sterile  fashion.  Then 3 cc of local anesthesia was injected, and an incision was  made to the right of midline and carried down to the platysmal muscle which  was elevated, opened, and undermined with Metzenbaum scissors.  I then  dissected in a plain medial to the sternocleidomastoid and lateral to the  trachea and esophagus to expose anterior cervical spine at C4-5 and C5-6.  Intraoperative fluoroscopy confirmed out level.  Then the longus colli  muscles were taken down bilaterally, and the __________  retractors were  placed under these.  We incised the disk space at C4-5 and at C5-6 with a 15  blade scalpel and performed the initial diskectomy with a pituitary rongeur  and curved curettes.  We used the drill  to drill down to the posterior and  longitudinal ligament.  The microscope was brought into the field, and then  the ligament at C5-6 was opened with a Kerrison and a circumferential  diskectomy was performed with the Kerrison punch.  We performed bilateral  foraminotomies, and the C6 nerve roots were identified.  Once the  decompression was complete at C5-6, we directed attention to C4-5 and opened  the posterior longitudinal ligament with a #1 Kerrison and then removed the  circumferential along with the posterior osteophytes with a Kerrison punch.  Bilateral foraminotomies were performed once again.  When decompression was  complete, we dried the surgical bed with Gelfoam, measured the interspaces  to be 6 mm, and used 6 mm Tutogen allograft bone grafts in the interspace at  C4-5 and C5-6 and then turned out incision to the anterior cervical plating.  We used a 38 mm Accufix plate and placed two 13 mm screws into the bodies of  C4-5, and C5-6 inclusive.  These were locked into position with a self-  locking mechanism and then irrigated with copious amounts of bacitracin-  containing saline  solution.  We dried up all bleeding points with bipolar  cautery.  Moist, meticulous hemostasis was achieved.  The platysma was  closed with 3-0 Vicryl.  The subcuticular was closed with 3-0 Vicryl.  The  skin was closed with Dermabond.  Drapes were removed.  The patient was  awakened from general anesthesia and transported to the recovery room in  stable condition.  At the end of the procedure, all sponge, needle, and  instrument counts were correct.                                               Tia Alert, MD    DSJ/MEDQ  D:  10/28/2003  T:  10/29/2003  Job:  [id number]

## 2011-02-21 ENCOUNTER — Inpatient Hospital Stay: Payer: Self-pay | Admitting: Internal Medicine

## 2011-02-21 IMAGING — CT CT ABD-PELV W/ CM
1 of 2 series · 15 of 32 positions shown, 19 images · IV contrast (isovue)
Comparison: None

REASON FOR EXAM: (1) vomiting x 3 days no diarrhea for last 2 days,
eoigastric pqain; (2) pain
COMMENTS:

PROCEDURE:     CT  - CT ABDOMEN / PELVIS  W  - [DATE]  [DATE]
RESULT:     History: Vomiting
TECHNIQUE: Multiple axial images of the abdomen and pelvis were performed
from the lung bases to the pubic symphysis, with p.o. contrast and with 100
ml of Isovue 370 intravenous contrast.

[Series 2: 3mm soft tissue · axial · 0.61mm/px · z∈[-1017,-627]mm · 15 of 142 slices shown, 19 images]
[im 6/142  soft-tissue]
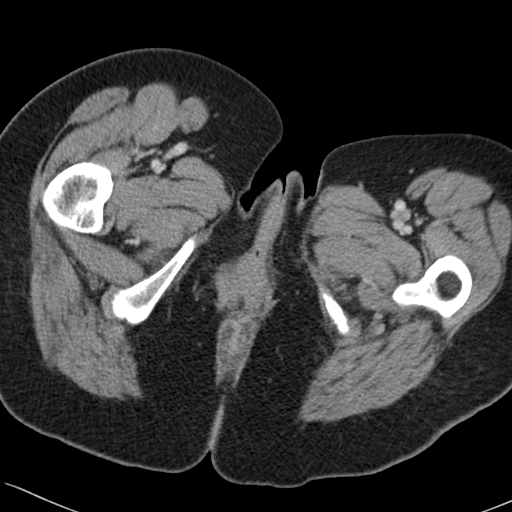
[im 6/142  bone]
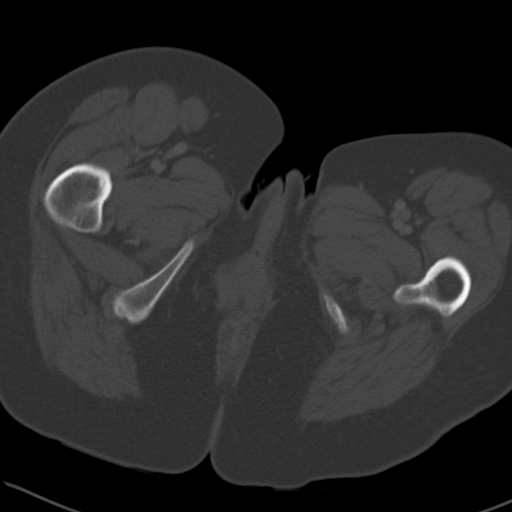
[im 17/142  soft-tissue]
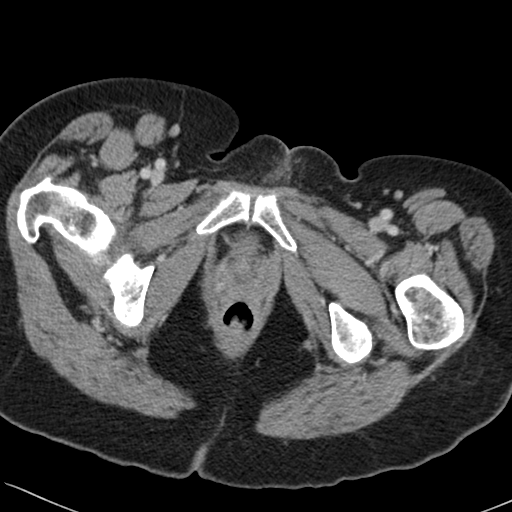
[im 29/142  soft-tissue]
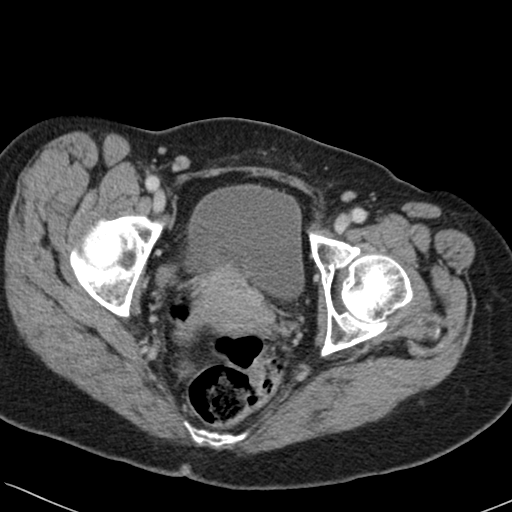
[im 40/142  soft-tissue]
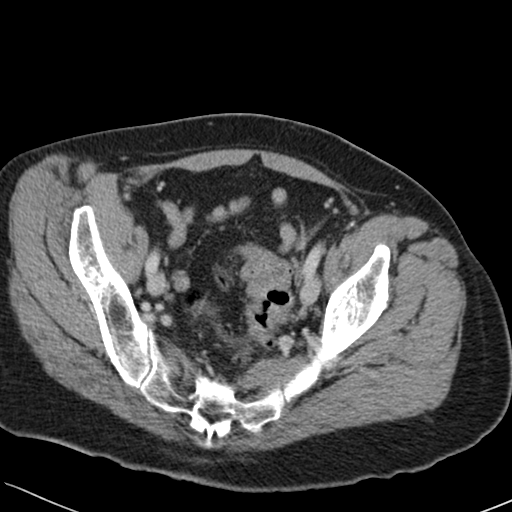
[im 51/142  soft-tissue]
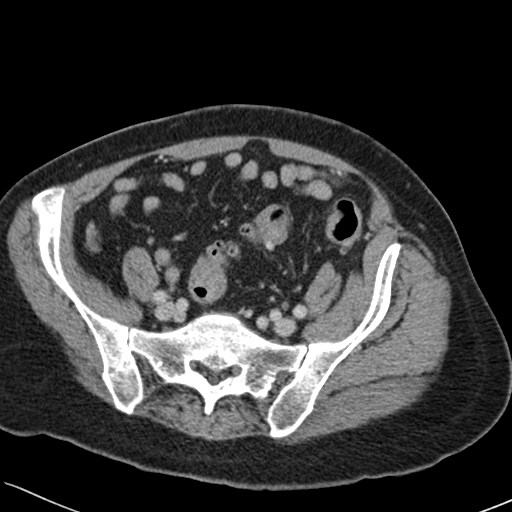
[im 63/142  soft-tissue]
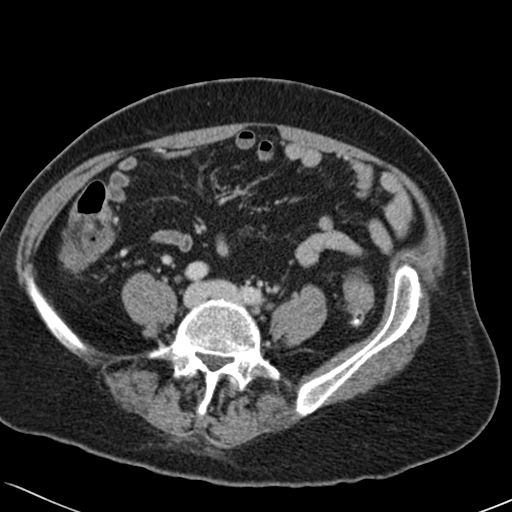
[im 74/142  soft-tissue]
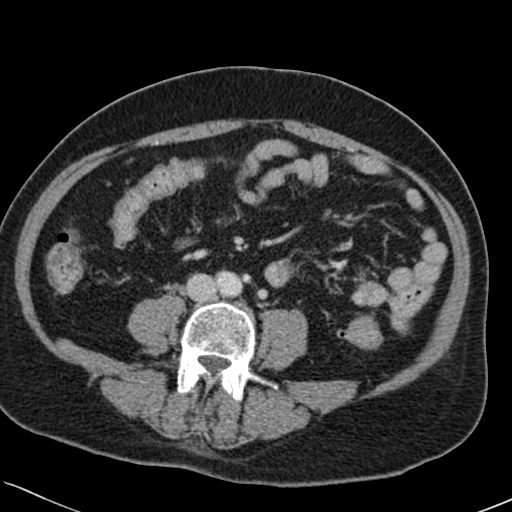
[im 79/142  soft-tissue]
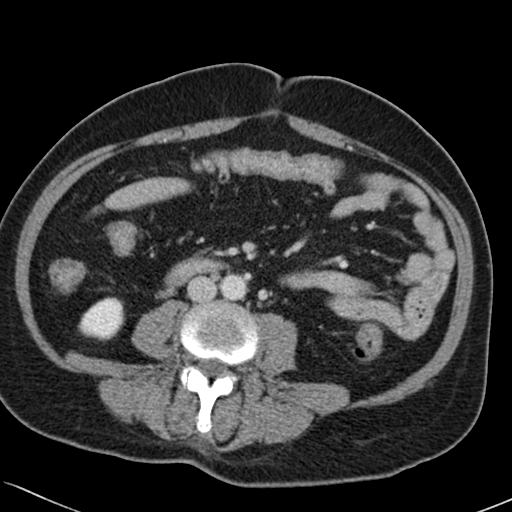
[im 91/142  soft-tissue]
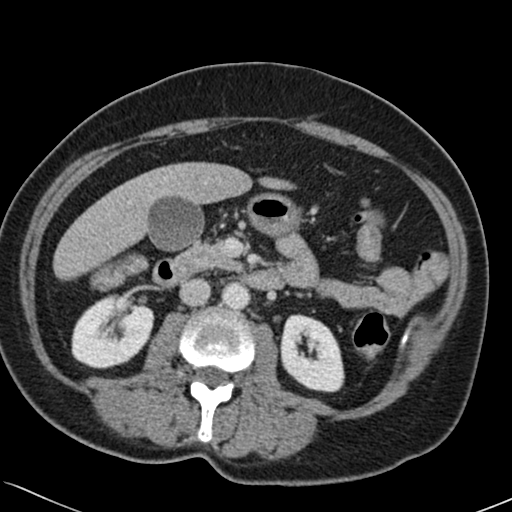
[im 91/142  bone]
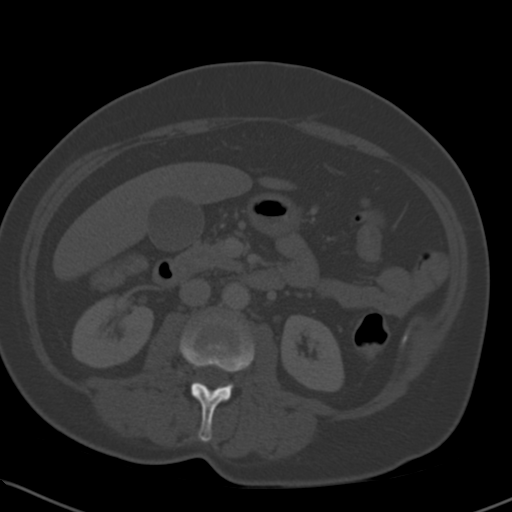
[im 102/142  soft-tissue]
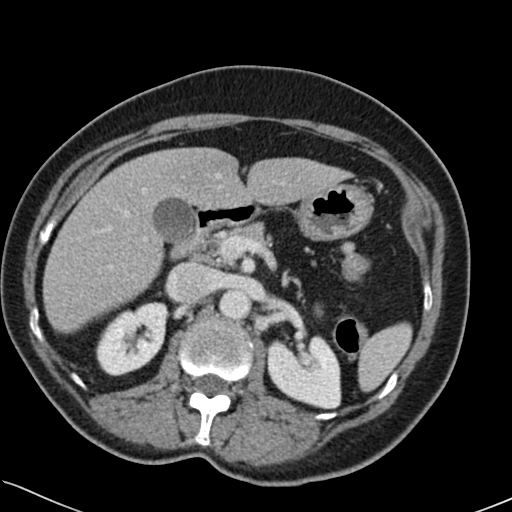
[im 113/142  soft-tissue]
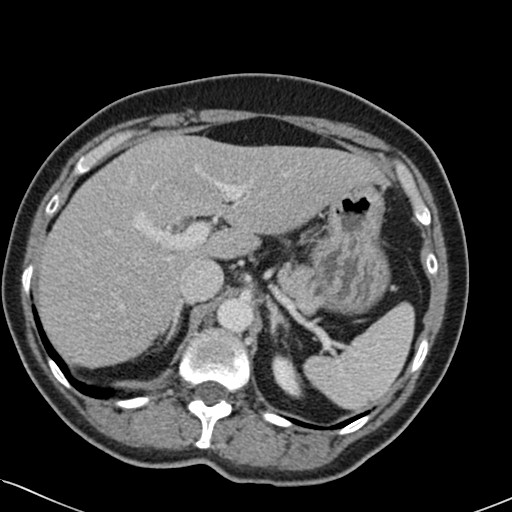
[im 119/142  lung]
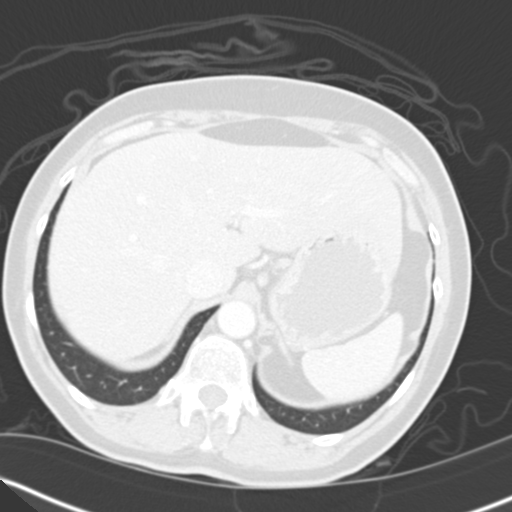
[im 125/142  soft-tissue]
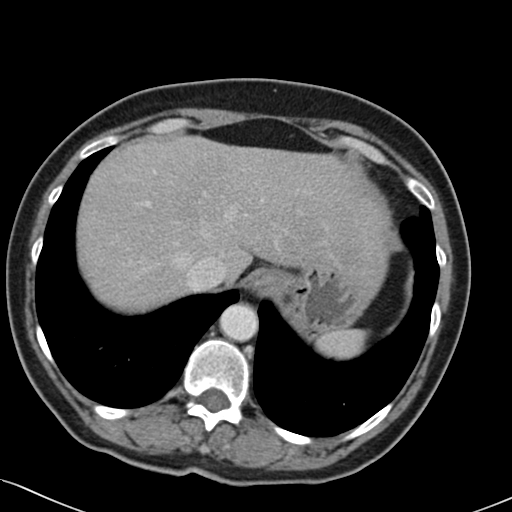
[im 125/142  lung]
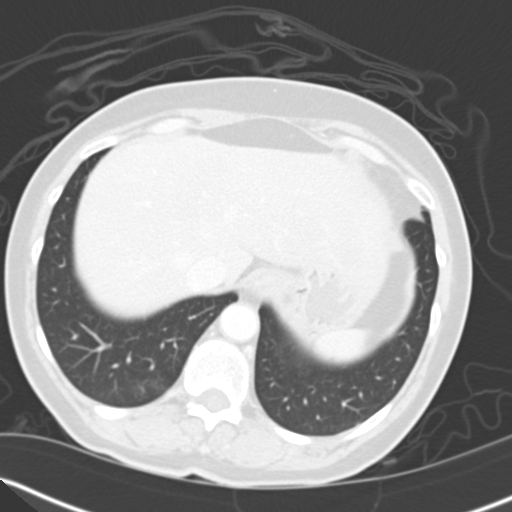
[im 130/142  lung]
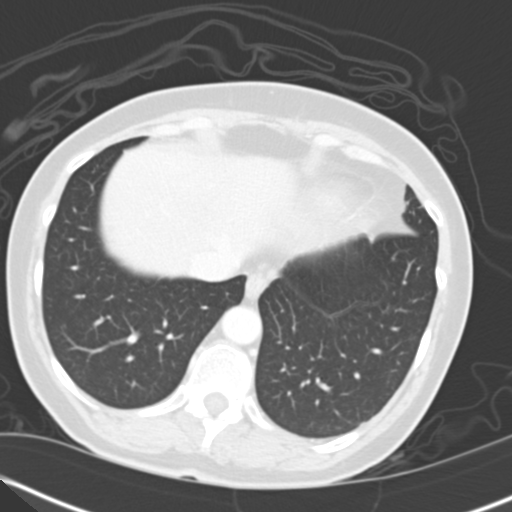
[im 136/142  soft-tissue]
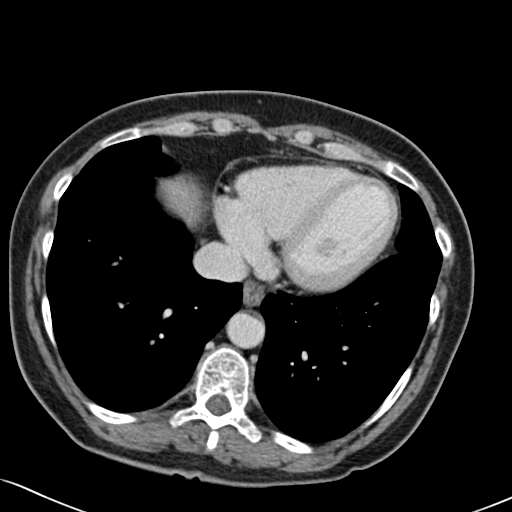
[im 136/142  lung]
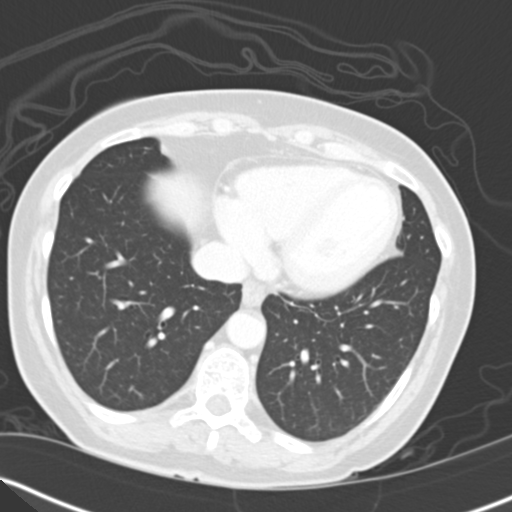

[15 of 32 positions shown; findings below may reference images not displayed]

FINDINGS: The lung bases are clear. There is no pneumothorax. The heart size is
normal.

The liver demonstrates no focal abnormality. There is no intrahepatic or
extrahepatic biliary ductal dilatation. The gallbladder is unremarkable. The
spleen demonstrates no focal abnormality. The kidneys, adrenal glands, and
pancreas are normal. The bladder is unremarkable.

The stomach, duodenum, small intestine, and large intestine demonstrate no
contrast extravasation or dilatation. There is diverticulosis without
evidence of diverticulitis. There is a normal caliber appendix in the right
lower quadrant without periappendiceal inflammatory changes. There is no
pneumoperitoneum, pneumatosis, or portal venous gas. There is no abdominal
or pelvic free fluid. There is no lymphadenopathy.

The abdominal aorta is normal in caliber .

The osseous structures are unremarkable.
IMPRESSION: No acute abdominal or pelvic pathology.

## 2011-04-08 ENCOUNTER — Inpatient Hospital Stay: Payer: Self-pay | Admitting: Internal Medicine

## 2011-07-29 ENCOUNTER — Ambulatory Visit: Payer: Self-pay | Admitting: Unknown Physician Specialty

## 2011-07-29 LAB — HM COLONOSCOPY

## 2013-01-15 LAB — COMPREHENSIVE METABOLIC PANEL
Alkaline Phosphatase: 116 U/L (ref 50–136)
Anion Gap: 9 (ref 7–16)
Bilirubin,Total: 0.5 mg/dL (ref 0.2–1.0)
Creatinine: 1.15 mg/dL (ref 0.60–1.30)
EGFR (African American): >60
Osmolality: 277 (ref 275–301)
Potassium: 3.5 mmol/L (ref 3.5–5.1)
SGOT(AST): 16 U/L (ref 15–37)

## 2013-01-15 LAB — CBC
HGB: 15.7 g/dL (ref 12.0–16.0)
MCHC: 33.8 g/dL (ref 32.0–36.0)

## 2013-01-16 ENCOUNTER — Observation Stay: Payer: Self-pay | Admitting: Family Medicine

## 2013-01-16 LAB — URINALYSIS, COMPLETE
Bilirubin,UR: NEGATIVE
Leukocyte Esterase: NEGATIVE
Nitrite: NEGATIVE
Ph: 5 (ref 4.5–8.0)
Protein: 30
RBC,UR: 6 /HPF (ref 0–5)
Specific Gravity: 1.021 (ref 1.003–1.030)
WBC UR: 1 /HPF (ref 0–5)

## 2013-01-16 LAB — DRUG SCREEN, URINE
Barbiturates, Ur Screen: NEGATIVE (ref ?–200)
Benzodiazepine, Ur Scrn: POSITIVE (ref ?–200)
Cannabinoid 50 Ng, Ur ~~LOC~~: NEGATIVE (ref ?–50)
Cocaine Metabolite,Ur ~~LOC~~: NEGATIVE (ref ?–300)
Methadone, Ur Screen: NEGATIVE (ref ?–300)
Opiate, Ur Screen: POSITIVE (ref ?–300)

## 2013-01-17 LAB — COMPREHENSIVE METABOLIC PANEL
Bilirubin,Total: 0.6 mg/dL (ref 0.2–1.0)
Chloride: 109 mmol/L — ABNORMAL HIGH (ref 98–107)
EGFR (African American): >60
EGFR (Non-African Amer.): >60
Osmolality: 279 (ref 275–301)
Potassium: 3.4 mmol/L — ABNORMAL LOW (ref 3.5–5.1)
Sodium: 139 mmol/L (ref 136–145)
Total Protein: 7.6 g/dL (ref 6.4–8.2)

## 2013-01-17 LAB — CBC WITH DIFFERENTIAL/PLATELET
Basophil #: 0 10*3/uL (ref 0.0–0.1)
Basophil %: 0.2 %
Eosinophil #: 0 10*3/uL (ref 0.0–0.7)
HGB: 14.1 g/dL (ref 12.0–16.0)
Lymphocyte %: 17.4 %
MCHC: 33.8 g/dL (ref 32.0–36.0)
Monocyte %: 6.4 %
Neutrophil %: 76 %
Platelet: 204 10*3/uL (ref 150–440)
RDW: 14.4 % (ref 11.5–14.5)

## 2013-01-17 LAB — TSH: Thyroid Stimulating Horm: 1.67 u[IU]/mL

## 2013-01-18 LAB — CBC WITH DIFFERENTIAL/PLATELET
Basophil #: 0 10*3/uL (ref 0.0–0.1)
Basophil %: 0.2 %
Eosinophil #: 0 10*3/uL (ref 0.0–0.7)
Eosinophil %: 0.3 %
HGB: 13.5 g/dL (ref 12.0–16.0)
Lymphocyte #: 2.7 10*3/uL (ref 1.0–3.6)
Lymphocyte %: 22.5 %
MCH: 28.8 pg (ref 26.0–34.0)
MCV: 87 fL (ref 80–100)
Monocyte %: 8.3 %
Neutrophil %: 68.7 %
Platelet: 192 10*3/uL (ref 150–440)
RBC: 4.68 10*6/uL (ref 3.80–5.20)
WBC: 12 10*3/uL — ABNORMAL HIGH (ref 3.6–11.0)

## 2013-01-18 LAB — BASIC METABOLIC PANEL
Creatinine: 0.74 mg/dL (ref 0.60–1.30)
EGFR (Non-African Amer.): >60
Glucose: 127 mg/dL — ABNORMAL HIGH (ref 65–99)

## 2014-01-07 ENCOUNTER — Ambulatory Visit: Payer: Self-pay | Admitting: Family Medicine

## 2014-01-07 IMAGING — US ABDOMEN ULTRASOUND
1 series · 14 of 25 positions shown · non-contrast
Comparison: CT, [DATE]

CLINICAL DATA: Abnormal liver function tests.

EXAM:
ULTRASOUND ABDOMEN COMPLETE

[Series 1: abdomen ultrasound · 0.34mm/px · 14 of 87 slices shown]
[im 1/87]
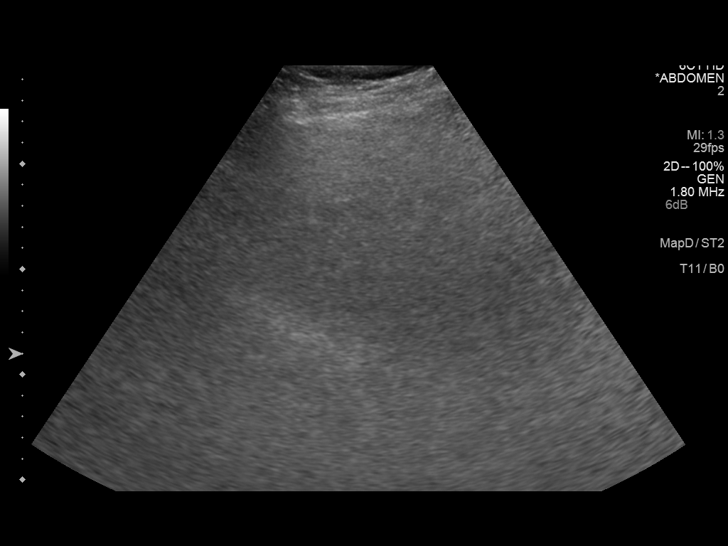
[im 8/87]
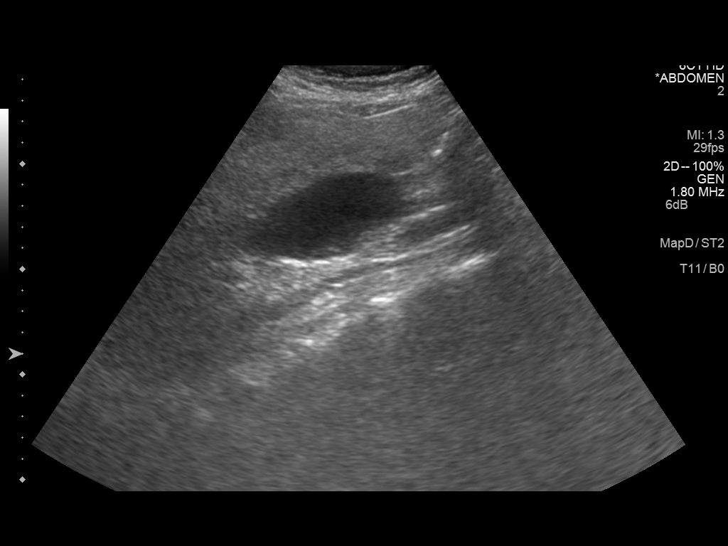
[im 15/87]
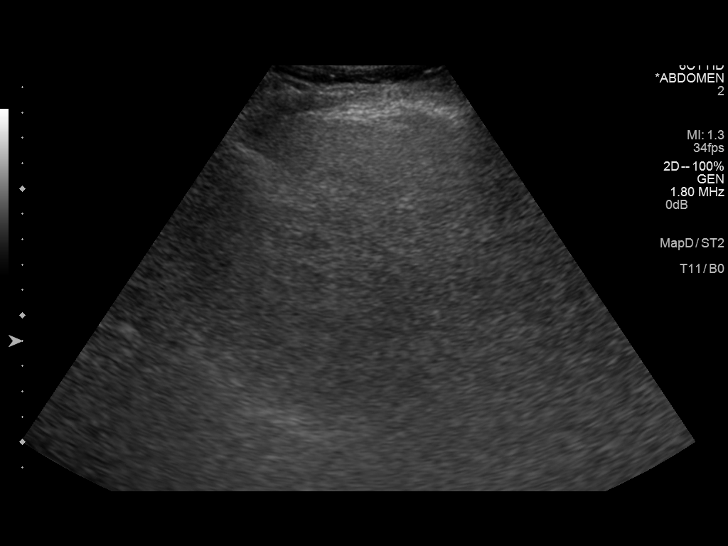
[im 22/87]
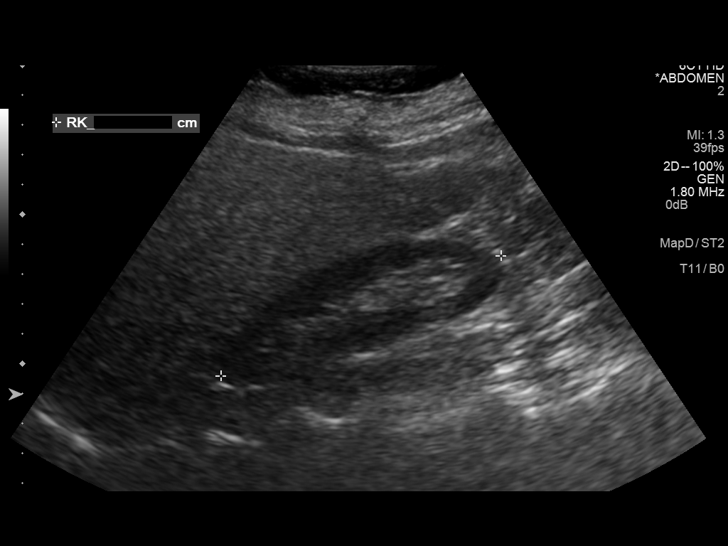
[im 29/87]
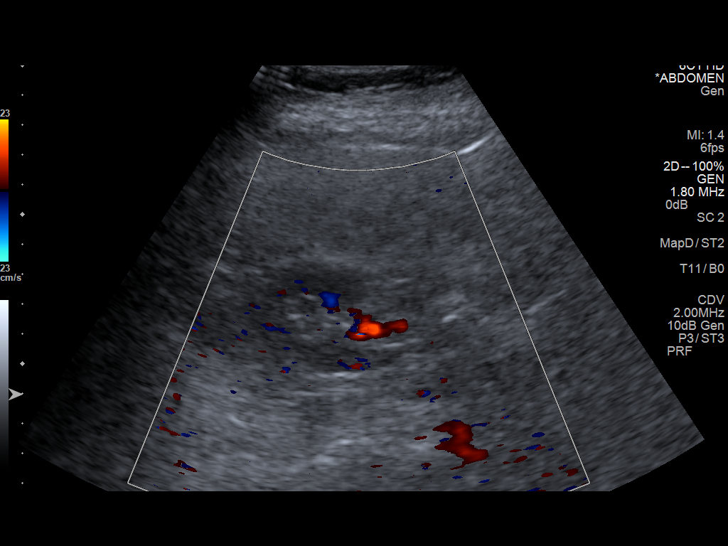
[im 33/87]
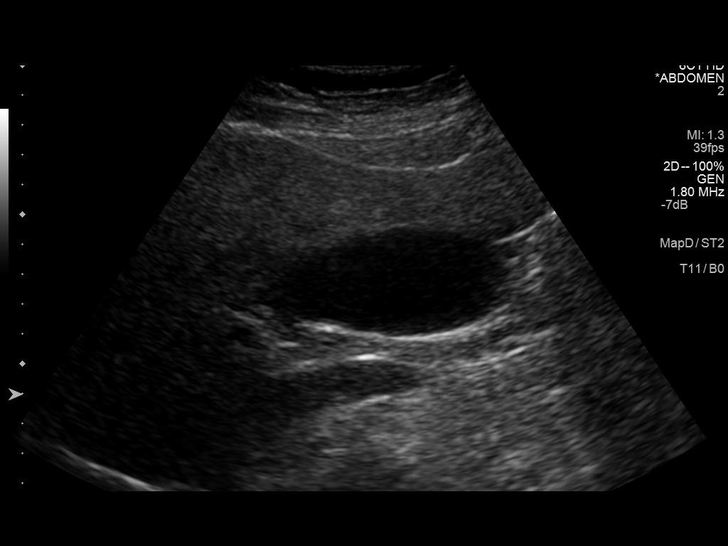
[im 40/87]
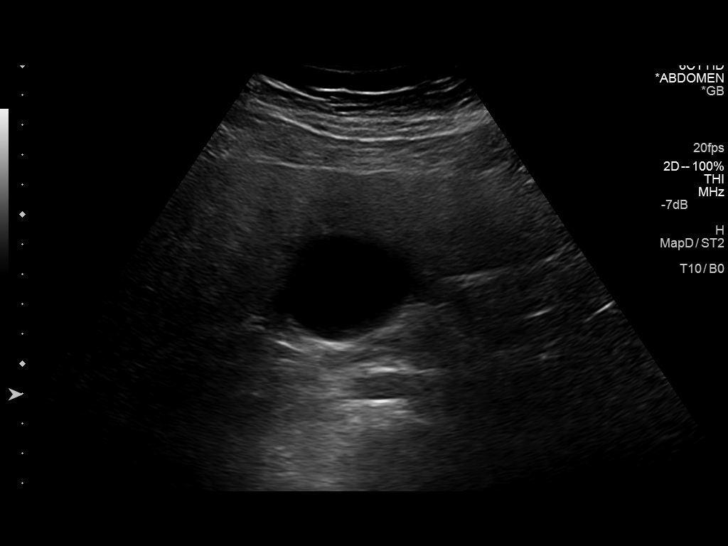
[im 47/87]
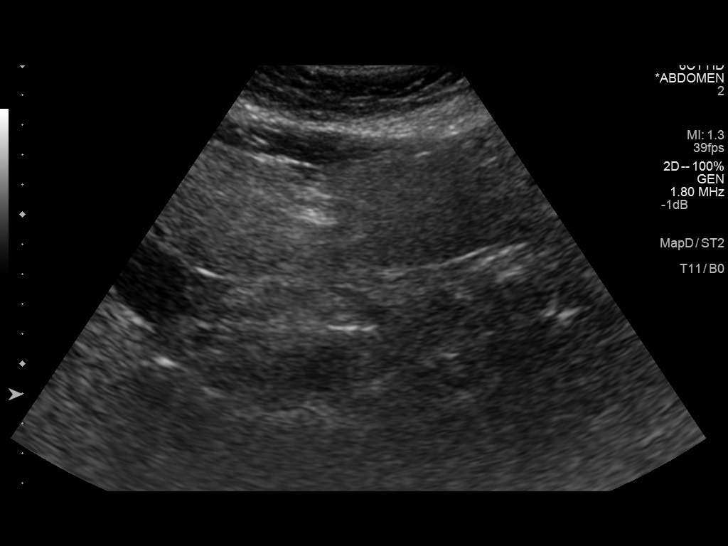
[im 54/87]
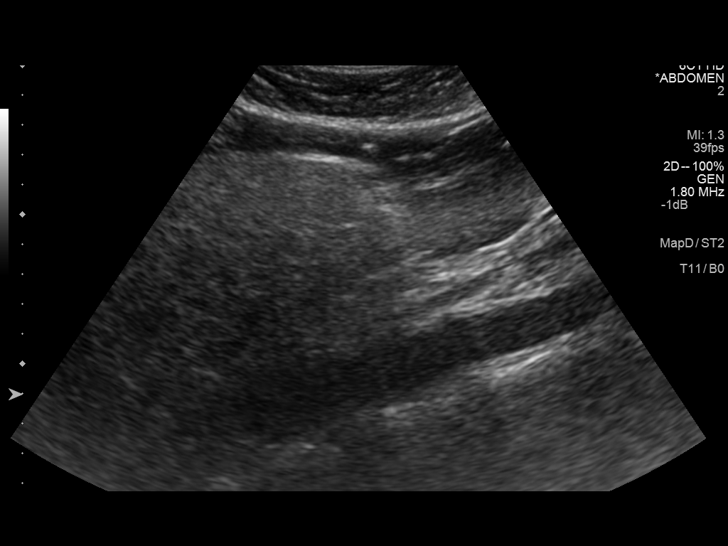
[im 58/87]
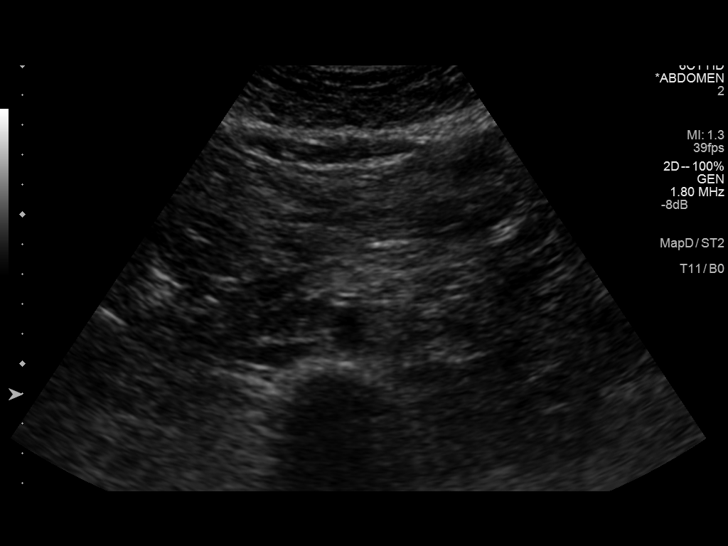
[im 65/87]
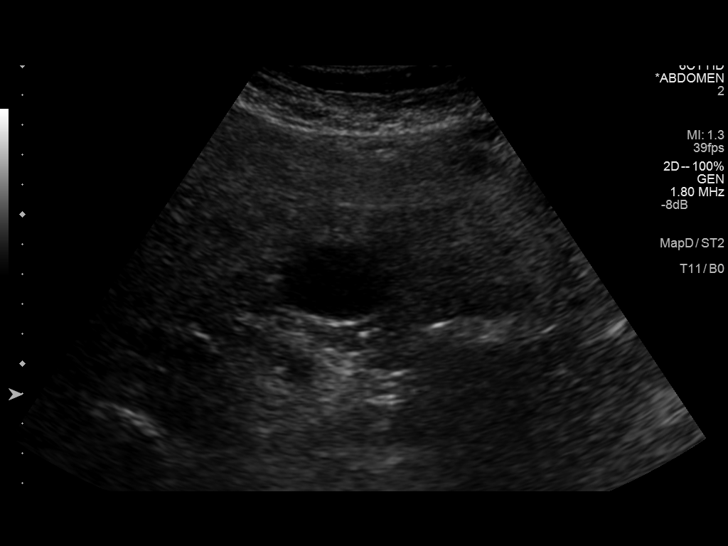
[im 72/87]
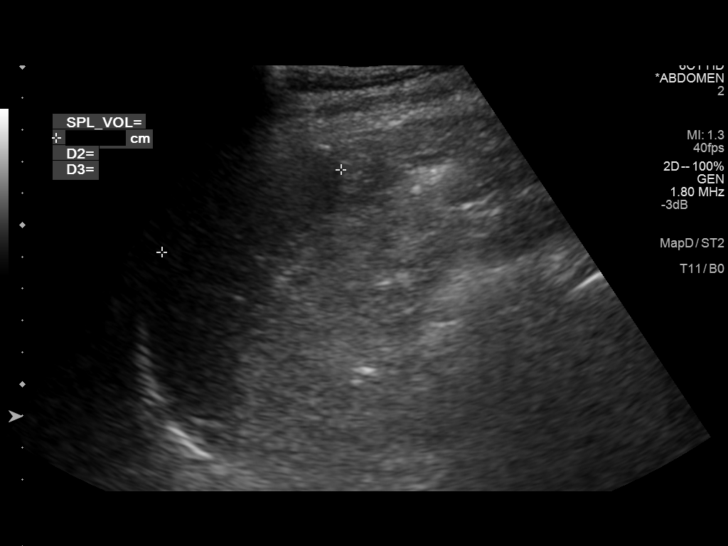
[im 79/87]
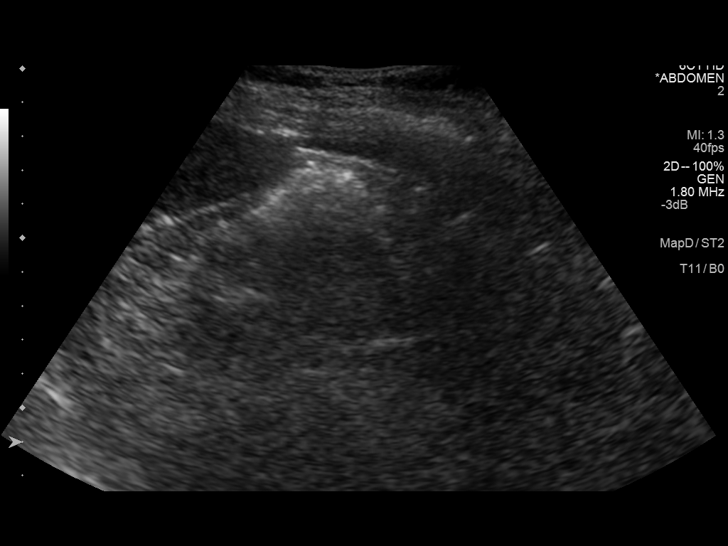
[im 87/87]
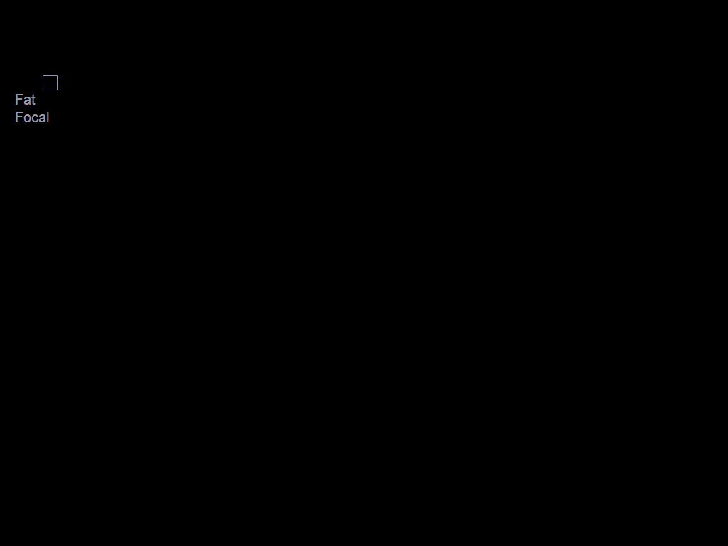

[14 of 25 positions shown; findings below may reference images not displayed]

FINDINGS: Gallbladder:

No gallstones or wall thickening visualized. No sonographic Murphy
sign noted.

Common bile duct:

Diameter: 5.7 mm

Liver:

Liver is echogenic. There is decreased through transmission of the
sound beam. Liver is borderline enlarged. No liver mass or focal
lesion. Hepatopetal flow was documented in the portal vein.

IVC:

No abnormality visualized.

Pancreas:

Visualized portion unremarkable.

Spleen:

Size and appearance within normal limits.

Right Kidney:

Length: 10.2 cm. Echogenicity within normal limits. No mass or
hydronephrosis visualized.

Left Kidney:

Length: 9.5 cm. Echogenicity within normal limits. No mass or
hydronephrosis visualized.

Abdominal aorta:

No aneurysm visualized.

Other findings:

None.
IMPRESSION: 1. No acute findings.
2. Hepatic steatosis.  Borderline hepatomegaly.

## 2014-05-06 DIAGNOSIS — M961 Postlaminectomy syndrome, not elsewhere classified: Secondary | ICD-10-CM | POA: Diagnosis not present

## 2014-05-06 DIAGNOSIS — Z79891 Long term (current) use of opiate analgesic: Secondary | ICD-10-CM | POA: Diagnosis not present

## 2014-05-06 DIAGNOSIS — G894 Chronic pain syndrome: Secondary | ICD-10-CM | POA: Diagnosis not present

## 2014-05-06 DIAGNOSIS — M4722 Other spondylosis with radiculopathy, cervical region: Secondary | ICD-10-CM | POA: Diagnosis not present

## 2014-05-12 DIAGNOSIS — E039 Hypothyroidism, unspecified: Secondary | ICD-10-CM | POA: Diagnosis not present

## 2014-05-12 DIAGNOSIS — R609 Edema, unspecified: Secondary | ICD-10-CM | POA: Diagnosis not present

## 2014-05-26 DIAGNOSIS — F3181 Bipolar II disorder: Secondary | ICD-10-CM | POA: Diagnosis not present

## 2014-05-26 DIAGNOSIS — F419 Anxiety disorder, unspecified: Secondary | ICD-10-CM | POA: Diagnosis not present

## 2014-07-20 DIAGNOSIS — Z79891 Long term (current) use of opiate analgesic: Secondary | ICD-10-CM | POA: Diagnosis not present

## 2014-07-20 DIAGNOSIS — M961 Postlaminectomy syndrome, not elsewhere classified: Secondary | ICD-10-CM | POA: Diagnosis not present

## 2014-07-20 DIAGNOSIS — M4722 Other spondylosis with radiculopathy, cervical region: Secondary | ICD-10-CM | POA: Diagnosis not present

## 2014-07-20 DIAGNOSIS — G894 Chronic pain syndrome: Secondary | ICD-10-CM | POA: Diagnosis not present

## 2014-07-21 DIAGNOSIS — F3181 Bipolar II disorder: Secondary | ICD-10-CM | POA: Diagnosis not present

## 2014-07-21 DIAGNOSIS — F419 Anxiety disorder, unspecified: Secondary | ICD-10-CM | POA: Diagnosis not present

## 2014-08-19 NOTE — Discharge Summary (Signed)
PATIENT NAME:  Anna Giles, Anna Giles MR#:  [id number] DATE OF BIRTH:  Jan 18, 1961  DATE OF ADMISSION:  01/16/2013 DATE OF DISCHARGE:  01/18/2013  REASON FOR ADMISSION: Intractable nausea and vomiting.  DISCHARGE DIAGNOSES:  1.  Intractable nausea and vomiting. 2.  Possible gastritis. 3.  Bipolar disorder and anxiety disorder.  4.  Hypokalemia.  5.  Increased white blood cells.  DISPOSITION: Home.   DISCHARGE FOLLOWUP: With Dr. Vira Agar, GI, in 1 to 2 weeks, Dr. Jacqualine Code in 1 to 2 weeks.  DISCHARGE INSTRUCTIONS: Plan to advance diet slowly and to increase PPI to double dose.  DISCHARGE MEDICATIONS: 1.  Xanax 0.5 mg 3 times daily. 2.  Zoloft 100 mg once a day. 3.  Skelaxin 800 mg 3 times daily. 4.  OxyContin 30 mg every 6 hours. 5.  Metoclopramide 10 mg 4 times a day with meals.  6.  The patient has a prescription for Protonix 40 mg twice daily. 7.  Zofran take 1 p.o. q. 6 hours as needed for vomiting. 8.  Phenergan suppository 25 mg every 6 hours as needed for nausea or vomiting.   IMPORTANT LABORATORY AND DIAGNOSTICS: Glucose 140 at admission, creatinine 1.15. Potassium was 3.5 on admission and 3.5 at discharge and the lowest it got was 3.4. Calcium on admission was 10.2 due to dehydration and at discharge 8.9. Bilirubin 0.5.  Other LFTs within normal limits. Urine drug analysis showed benzodiazepines and opiates that are the medications the patient is taking regularly. White blood count on admission was 14,000 and at discharge 12,000. Hemoglobin was 13.5. Urinalysis showed 6 red blood cells, 1 white blood cell, negative nitrites, negative leukocyte esterase, increased ketones.  HOSPITAL COURSE:  This is a very nice 54 year old female who has history of bipolar disorder and GERD who had an EGD last April of 2013 revealing gastritis and a hiatal hernia. This time she presented to the ER with the chief complaint of nausea and vomiting which has been intractable for about 24 hours. She has had  some bloating, but no significant abdominal pain. She had normal bowel movements, no diarrhea, prior to coming into the hospital. There was no significant fever or chills, no abdominal pain whatsoever. The patient was admitted as she looked dehydrated to get hydration and she could not tolerate anything orally. The patient was given IV fluids for rehydration, IV Protonix and nausea medication. She was put on IV pain medications, just for her chronic pain, not for acute pain. The IV morphine was stopped and changed to her regular medication, OxyContin, within 24 hours. The patient started improving slowly. She had significant decrease of affect, very flat, which is secondary to her bipolar disorder. The patient states that she is followed by an outpatient psychiatrist. Her electrolytes were very stable. The signs of dehydration was high calcium, high protein and increased white blood cells, just from hemoconcentration, and they started to trend down after hydration was given. The patient was able to tolerate diet prior to discharge. She was discharged in good condition with the medications specified above. The patient has been taking over-the-counter Prilosec for her gastritis and seems that this might be an exacerbation of that issue as she indicates Prilosec was working very good before, but not right now. We gave her a prescription for Protonix to start taking twice daily for now and follow up with Dr. Vira Agar for evaluation and maybe EGD if necessary.   TIME SPENT: I spent about 35 minutes with this patient at discharge. ____________________________ Roselie Awkward  James Ivanoff, MD rsg:sb D: 01/19/2013 06:53:41 ET T: 01/19/2013 07:07:18 ET JOB#: [id number]  cc: Emajagua Sink, MD, <Dictator> Manya Silvas, MD Milinda Pointer Jacqualine Code, MD Roselie Awkward America Brown MD ELECTRONICALLY SIGNED 01/21/2013 11:05

## 2014-08-19 NOTE — H&P (Signed)
PATIENT NAME:  Anna Giles, SIGLIN MR#:  [id number] DATE OF BIRTH:  [birth year]  DATE OF ADMISSION:  01/15/2013  PRIMARY CARE PHYSICIAN:  Dr. Debbora Dus.   REFERRING PHYSICIAN:  Dr. Corky Downs.   CHIEF COMPLAINT:  Intractable nausea and vomiting.   HISTORY OF PRESENT ILLNESS:  The patient is a 54 year old Caucasian female with past medical history of GERD status post EGD done in April 2013 which has revealed gastritis and hiatal hernia, is presenting to the ER with a chief complaint of intractable nausea and vomiting.  The patient is reporting that she was in her usual state of health until yesterday morning and at around 5:00 she started vomiting associated with abdominal bloating.  She had normal bowel movements and denies any diarrhea.  No fever, no sick contacts.  She was unable to eat any food because of the intractable nausea and vomiting.  The patient eventually started feeling dizzy and then came into the ER.  In the ER, the patient has received IV fluids and antinausea medication, still she was feeling sick and nauseous and the ER physician thought of discharging her home after giving IV fluids and antinausea medication with no significant improvement.  Hospitalist team is called to admit the patient.  During my examination, the patient is still feeling weak and tired and feeling dizzy.  Denies any abdominal pain, but feeling bloated.  Her husband is at bedside.  Denies any blood in her vomit.   PAST MEDICAL HISTORY:  GERD, chronic low back pain, bipolar disorder.   PAST SURGICAL HISTORY:  Neck surgery, chronic low back pain, D and C and bladder surgery.  The patient had EGD done in April 2013 which has revealed hiatal hernia and gastritis.  ALLERGIES:  The patient is allergic to BEE STINGS.   PSYCHOSOCIAL HISTORY:  Lives at home with husband.  Smokes 1 pack a day.  Denies alcohol or illicit drug usage.   FAMILY HISTORY:  Both parents are healthy.   HOME MEDICATIONS:  Zoloft 100 mg once daily,  Xanax 0.5 mg 3 times a day, Skelaxin 800 mg 3 times a day, OxyContin 30 mg every six hours, metoclopramide 10 mg 4 times a day.   REVIEW OF SYSTEMS:  CONSTITUTIONAL:  Denies any fever.  Complaining of fatigue and weakness.  EYES:  Denies blurry vision, glaucoma.  EARS, NOSE, THROAT:  No epistaxis, discharge.  RESPIRATION:  Denies cough, COPD.  CARDIOVASCULAR:  No chest pain, edema. GASTROINTESTINAL:  Intractable nausea, vomiting.  Denies diarrhea.  Complaining of abdominal bloating.  No abdominal pain.  GYNECOLOGIC AND BREAST:  No breast masses or vaginal discharge.  GENITOURINARY:  No dysuria, hematuria.  ENDOCRINE:  No polyuria, nocturia, thyroid problems.  HEMATOLOGY AND LYMPHATIC:  No anemia, easy bruising, bleeding.  INTEGUMENTARY:  No acne, rash, lesions.  MUSCULOSKELETAL:  Has chronic low back pain and neck pain.  Denies any gout. NEUROLOGIC:  No vertigo, ataxia.   PSYCHIATRIC:  No ADD, OCD.  PHYSICAL EXAMINATION: VITAL SIGNS:  Temperature 98.2, pulse 63, respirations 16, blood pressure 141/92, pulse ox 98% on room air.  GENERAL APPEARANCE:  Not under acute distress.  Moderately built and nourished.  HEENT:  Normocephalic, atraumatic.  Pupils are equally reacting to light and accommodation.  No scleral icterus.  No conjunctival injection.  No sinus tenderness.  Dry mucous membranes.  NECK:  Supple.  No JVD.  No thyromegaly.  Range of motion is intact.  LUNGS:  Clear to auscultation bilaterally.  No accessory muscle usage.  No anterior chest wall tenderness on palpation.  HEART:  S1 and S2 normal.  Regular rate and rhythm.  No murmurs.  GASTROINTESTINAL:  Soft.  Bowel sounds are positive in all four quadrants.  Nontender, nondistended.  No hepatosplenomegaly.  No masses felt.  No CVA tenderness.  NEUROLOGIC:  Awake, alert, oriented x 3.  Cranial nerves II through XII are grossly intact.  Motor and sensory are grossly intact.  Reflexes are 2+.  EXTREMITIES:  No edema.  No cyanosis.   No clubbing.  MUSCULOSKELETAL:  No joint effusion, tenderness, erythema.  PSYCHIATRIC:  Normal mood and affect.   LABORATORY AND IMAGING STUDIES:  Urinalysis is normal.  No nitrite, leukocyte esterase.  CBC, white count is elevated at 14.1.  Hemoglobin and hematocrit are normal.  Platelet count is 243.  LFTs normal, except total protein which is slightly elevated at 9.2.  BMP, glucose is 140, sodium 137, potassium 3.5, chloride 103, CO2 25, GFR 55.  Serum osmolality 277, calcium 10.2.   ASSESSMENT AND PLAN:  A 54 year old Caucasian female presenting to the ER with a chief complaint of intractable nausea, vomiting associated with dizziness and weakness for the past two days will be admitted with the following assessment and plan.  1.  Intractable nausea and vomiting.  Probably from acute gastritis.  It could be viral in etiology.  We will admit her to tele bed.  Keep her nothing by mouth.  We will provide her hydration with IV fluids.  IV Protonix.  Check a.m. labs.  2.  Gastroesophageal reflux disease.  The patient is on IV Protonix.  3.  Chronic low back pain.  The patient being nothing by mouth, we will provide her morphine IV as needed basis.  4.  Bipolar disorder.  The patient being nothing by mouth, her home medications are held at this time.  5.  GI prophylaxis with Protonix.  DVT prophylaxis is not needed as the patient is ambulatory.   Diagnosis and plan of care was discussed in detail with the patient and her husband at bedside.  They both verbalized understanding of the plan.    SHE IS FULL CODE.  Husband is the medical power of attorney.   Total time spent on the admission is 45 minutes.     ____________________________ Nicholes Mango, MD ag:ea D: 01/16/2013 03:26:57 ET T: 01/16/2013 04:43:18 ET JOB#: [id number]  cc: Nicholes Mango, MD, <Dictator> Milinda Pointer. Jacqualine Code, MD Nicholes Mango MD ELECTRONICALLY SIGNED 01/17/2013 7:18

## 2014-08-30 DIAGNOSIS — R6889 Other general symptoms and signs: Secondary | ICD-10-CM | POA: Insufficient documentation

## 2014-08-30 DIAGNOSIS — IMO0001 Reserved for inherently not codable concepts without codable children: Secondary | ICD-10-CM | POA: Insufficient documentation

## 2014-08-30 DIAGNOSIS — M542 Cervicalgia: Secondary | ICD-10-CM

## 2014-08-30 DIAGNOSIS — M25519 Pain in unspecified shoulder: Secondary | ICD-10-CM | POA: Insufficient documentation

## 2014-08-30 DIAGNOSIS — K2970 Gastritis, unspecified, without bleeding: Secondary | ICD-10-CM | POA: Insufficient documentation

## 2014-08-30 DIAGNOSIS — F3181 Bipolar II disorder: Secondary | ICD-10-CM | POA: Insufficient documentation

## 2014-08-30 DIAGNOSIS — R7401 Elevation of levels of liver transaminase levels: Secondary | ICD-10-CM | POA: Insufficient documentation

## 2014-08-30 DIAGNOSIS — F419 Anxiety disorder, unspecified: Secondary | ICD-10-CM | POA: Insufficient documentation

## 2014-08-30 DIAGNOSIS — Z7189 Other specified counseling: Secondary | ICD-10-CM | POA: Insufficient documentation

## 2014-08-30 DIAGNOSIS — E039 Hypothyroidism, unspecified: Secondary | ICD-10-CM | POA: Insufficient documentation

## 2014-08-30 DIAGNOSIS — Z1331 Encounter for screening for depression: Secondary | ICD-10-CM | POA: Insufficient documentation

## 2014-08-30 DIAGNOSIS — R030 Elevated blood-pressure reading, without diagnosis of hypertension: Secondary | ICD-10-CM

## 2014-08-30 DIAGNOSIS — R0689 Other abnormalities of breathing: Secondary | ICD-10-CM | POA: Insufficient documentation

## 2014-08-30 DIAGNOSIS — R740 Nonspecific elevation of levels of transaminase and lactic acid dehydrogenase [LDH]: Secondary | ICD-10-CM

## 2014-08-30 DIAGNOSIS — K760 Fatty (change of) liver, not elsewhere classified: Secondary | ICD-10-CM | POA: Insufficient documentation

## 2014-08-30 DIAGNOSIS — G8929 Other chronic pain: Secondary | ICD-10-CM | POA: Insufficient documentation

## 2014-09-13 DIAGNOSIS — Z79891 Long term (current) use of opiate analgesic: Secondary | ICD-10-CM | POA: Diagnosis not present

## 2014-09-13 DIAGNOSIS — G894 Chronic pain syndrome: Secondary | ICD-10-CM | POA: Diagnosis not present

## 2014-09-13 DIAGNOSIS — M961 Postlaminectomy syndrome, not elsewhere classified: Secondary | ICD-10-CM | POA: Diagnosis not present

## 2014-09-13 DIAGNOSIS — M4722 Other spondylosis with radiculopathy, cervical region: Secondary | ICD-10-CM | POA: Diagnosis not present

## 2014-10-20 ENCOUNTER — Ambulatory Visit: Payer: Commercial Managed Care - HMO | Admitting: Psychiatry

## 2014-11-21 ENCOUNTER — Telehealth: Payer: Self-pay | Admitting: Family Medicine

## 2014-11-21 NOTE — Telephone Encounter (Signed)
Patient has [employer] and is needing a referral to [facility]-Dr Nicholaus Bloom. States she has been seeing him for years. Please call them ([phone] (F) [phone]

## 2014-11-21 NOTE — Telephone Encounter (Signed)
Patient has [employer] and is needing a referral to [facility]-Dr Nicholaus Bloom. States she has been seeing him for years. Please call them ([phone] (F) [phone]

## 2014-11-22 ENCOUNTER — Ambulatory Visit: Payer: Commercial Managed Care - HMO | Admitting: Psychiatry

## 2014-11-29 DIAGNOSIS — M961 Postlaminectomy syndrome, not elsewhere classified: Secondary | ICD-10-CM | POA: Diagnosis not present

## 2014-11-29 DIAGNOSIS — Z79891 Long term (current) use of opiate analgesic: Secondary | ICD-10-CM | POA: Diagnosis not present

## 2014-11-29 DIAGNOSIS — G894 Chronic pain syndrome: Secondary | ICD-10-CM | POA: Diagnosis not present

## 2014-11-29 DIAGNOSIS — M4722 Other spondylosis with radiculopathy, cervical region: Secondary | ICD-10-CM | POA: Diagnosis not present

## 2014-12-20 ENCOUNTER — Ambulatory Visit (INDEPENDENT_AMBULATORY_CARE_PROVIDER_SITE_OTHER): Payer: Commercial Managed Care - HMO | Admitting: Psychiatry

## 2014-12-20 ENCOUNTER — Encounter: Payer: Self-pay | Admitting: Psychiatry

## 2014-12-20 VITALS — BP 100/78 | HR 75 | Temp 98.2°F | Ht 63.0 in | Wt 137.4 lb

## 2014-12-20 DIAGNOSIS — F3170 Bipolar disorder, currently in remission, most recent episode unspecified: Secondary | ICD-10-CM | POA: Diagnosis not present

## 2014-12-20 DIAGNOSIS — F411 Generalized anxiety disorder: Secondary | ICD-10-CM

## 2014-12-20 MED ORDER — QUETIAPINE FUMARATE 50 MG PO TABS: 50.0000 mg | ORAL_TABLET | Freq: Every day | ORAL | Status: DC

## 2014-12-20 MED ORDER — ESCITALOPRAM OXALATE 10 MG PO TABS: 10.0000 mg | ORAL_TABLET | ORAL | Status: DC

## 2014-12-20 NOTE — Progress Notes (Signed)
[facility] MD/PA/NP OP Progress Note  12/20/2014 4:16 PM Anna Giles  MRN:  [id number]  Subjective:  Patient returns for follow-up of her bipolar 2 disorder and anxiety. She states things continue to go well for her. She states she is very happy that she's lost 25 pounds. She attributes this to walking and improving her diet. She states the medications continue to work well and her mood is bright and good. She relates that the only issue she had is that when she would take the Seroquel 100 mg at bedtime that she would have morning time sedation and that she cut her dose to 50 mg and she has not had any morning sedation at that dose. Chief Complaint:  Chief Complaint    Follow-up; Medication Refill     Visit Diagnosis:  No diagnosis found.  Past Medical History:  Past Medical History  Diagnosis Date  . Hypothyroidism, adult   . Bilateral shoulder pain   . Chronic pain of both shoulders   . Fatty liver disease, nonalcoholic   . Chronic neck pain   . Gastritis   . BP (high blood pressure)   . Encounter to establish care   . Hypothyroidism   . Transaminitis   . Bipolar disorder   . Abnormal exam/test finding   . Screening for depression   . Bipolar II disorder   . Anxiety     Past Surgical History  Procedure Laterality Date  . Neck surgery    . Bladder sugery     Family History:  Family History  Problem Relation Age of Onset  . Depression Mother   . Ulcers Sister   . Breast cancer Paternal Grandmother    Social History:  Social History   Social History  . Marital Status: Married    Spouse Name: N/A  . Number of Children: N/A  . Years of Education: N/A   Social History Main Topics  . Smoking status: Current Every Day Smoker -- 0.50 packs/day    Types: Cigarettes    Start date: 12/20/1994  . Smokeless tobacco: Never Used  . Alcohol Use: No  . Drug Use: No  . Sexual Activity: No   Other Topics Concern  . None   Social History Narrative   Additional History:    Assessment:   Musculoskeletal: Strength & Muscle Tone: within normal limits Gait & Station: normal Patient leans: N/A  Psychiatric Specialty Exam: HPI  Review of Systems  Psychiatric/Behavioral: Negative for depression, suicidal ideas, hallucinations, memory loss and substance abuse. The patient is not nervous/anxious and does not have insomnia.     Blood pressure 100/78, pulse 75, temperature 98.2 F (36.8 C), temperature source Tympanic, height 5\' 3"  (1.6 m), weight 137 lb 6.4 oz (62.324 kg), SpO2 97 %.Body mass index is 24.35 kg/(m^2).  General Appearance: Neat and Well Groomed  Eye Contact:  Good  Speech:  Normal Rate  Volume:  Normal  Mood:  Good  Affect:  bright  Thought Process:  Linear and Logical  Orientation:  Full (Time, Place, and Person)  Thought Content:  Negative  Suicidal Thoughts:  No  Homicidal Thoughts:  No  Memory:  Immediate;   Good Recent;   Good Remote;   Good  Judgement:  Good  Insight:  Good  Psychomotor Activity:  Negative  Concentration:  Good  Recall:  Good  Fund of Knowledge: Good  Language: Good  Akathisia:  Negative  Handed:  Right  AIMS (if indicated):  Not done today  Assets:  Communication Skills Desire for Improvement Social Support  ADL's:  Intact  Cognition: WNL  Sleep:  good   Is the patient at risk to self?  No. Has the patient been a risk to self in the past 6 months?  No. Has the patient been a risk to self within the distant past?  No. Is the patient a risk to others?  No. Has the patient been a risk to others in the past 6 months?  No. Has the patient been a risk to others within the distant past?  No.  Current Medications: Current Outpatient Prescriptions  Medication Sig Dispense Refill  . ALPRAZolam (XANAX) 0.5 MG tablet Take 1 tablet by mouth as needed.    . Cholecalciferol 400 UNITS CAPS Take 1 capsule by mouth 2 (two) times daily.    Marland Kitchen escitalopram (LEXAPRO) 10 MG tablet Take 1 tablet (10 mg total) by mouth  every morning. 30 tablet 3  . levothyroxine (SYNTHROID, LEVOTHROID) 100 MCG tablet Take 1 tablet by mouth every morning.    Marland Kitchen levothyroxine (SYNTHROID, LEVOTHROID) 125 MCG tablet Take 1 tablet by mouth every morning.    Marland Kitchen morphine (AVINZA) 60 MG 24 hr capsule Take 1 capsule by mouth daily.    Marland Kitchen morphine (MS CONTIN) 60 MG 12 hr tablet Take 1 tablet by mouth 3 (three) times daily.    Marland Kitchen oxyCODONE-acetaminophen (PERCOCET/ROXICET) 5-325 MG per tablet Take 1 tablet by mouth as needed.    . pantoprazole (PROTONIX) 40 MG tablet Take 1 tablet by mouth daily.    . QUEtiapine (SEROQUEL) 50 MG tablet Take 1 tablet (50 mg total) by mouth at bedtime. 30 tablet 3  . tiZANidine (ZANAFLEX) 4 MG tablet Take 1 tablet by mouth 3 (three) times daily.    Marland Kitchen VITAMIN D, CHOLECALCIFEROL, PO Take by mouth.     No current facility-administered medications for this visit.    Medical Decision Making:  Established Problem, Stable/Improving (1) and Review of Medication Regimen & Side Effects (2)  Treatment Plan Summary:Medication management and Plan We'll continue the patient's Lexapro 10 mg a day. We will continue her at Seroquel 50 mg. She had already decreased her dose from 150 and thus we will rewrite her prescription to reflect this. She is stable on this regimen and reports good mood. He months. She's been encouraged call sooner with any questions or concerns.   Faith Rogue 12/20/2014, 4:16 PM

## 2014-12-26 ENCOUNTER — Other Ambulatory Visit: Payer: Self-pay | Admitting: Psychiatry

## 2015-01-11 ENCOUNTER — Other Ambulatory Visit: Payer: Self-pay | Admitting: Family Medicine

## 2015-01-12 NOTE — Telephone Encounter (Signed)
Patient needs to schedule appointment, once appointment has been scheduled medication can be refilled

## 2015-01-16 ENCOUNTER — Other Ambulatory Visit: Payer: Self-pay | Admitting: Family Medicine

## 2015-01-16 DIAGNOSIS — E038 Other specified hypothyroidism: Secondary | ICD-10-CM

## 2015-01-31 ENCOUNTER — Encounter: Payer: Self-pay | Admitting: Family Medicine

## 2015-01-31 ENCOUNTER — Ambulatory Visit (INDEPENDENT_AMBULATORY_CARE_PROVIDER_SITE_OTHER): Payer: Commercial Managed Care - HMO | Admitting: Family Medicine

## 2015-01-31 DIAGNOSIS — E038 Other specified hypothyroidism: Secondary | ICD-10-CM

## 2015-01-31 DIAGNOSIS — L989 Disorder of the skin and subcutaneous tissue, unspecified: Secondary | ICD-10-CM | POA: Diagnosis not present

## 2015-01-31 MED ORDER — LEVOTHYROXINE SODIUM 75 MCG PO TABS: 75.0000 ug | ORAL_TABLET | Freq: Every morning | ORAL | Status: DC

## 2015-01-31 NOTE — Progress Notes (Signed)
Name: Anna Giles   MRN: [id number]    DOB: 08/11/1960   Date:01/31/2015       Progress Note  Subjective  Chief Complaint  Chief Complaint  Patient presents with  . Medication Refill    levothyroxine 57mcg  . Manic Behavior  . Anxiety   Thyroid Problem Presents for follow-up visit. Symptoms include anxiety and constipation (opioid induced constipation). Patient reports no depressed mood, dry skin, fatigue, hair loss or weight loss. Past treatments include levothyroxine.   Skin Lesion Pt. Is here for follow up of a non-resolving lesion on her right upper back. First noticed it 3-4 weeks ago when she scratched her back on the corner of the wall. She noticed something on her back started to bleed. She has since noticed a non-healing lesion which usually scabs over again and again. She is concerned about the non-healing lesion and worried that it may be a 'melanoma' given that her mother has history of melanoma of the face.  Past Medical History  Diagnosis Date  . Hypothyroidism, adult   . Bilateral shoulder pain   . Chronic pain of both shoulders   . Fatty liver disease, nonalcoholic   . Chronic neck pain   . Gastritis   . BP (high blood pressure)   . Encounter to establish care   . Hypothyroidism   . Transaminitis   . Bipolar disorder ([place])   . Abnormal exam/test finding   . Screening for depression   . Bipolar II disorder ([place])   . Anxiety     Past Surgical History  Procedure Laterality Date  . Neck surgery    . Bladder sugery      Family History  Problem Relation Age of Onset  . Depression Mother   . Ulcers Sister   . Breast cancer Paternal Grandmother     Social History   Social History  . Marital Status: Married    Spouse Name: N/A  . Number of Children: N/A  . Years of Education: N/A   Occupational History  . Not on file.   Social History Main Topics  . Smoking status: Current Every Day Smoker -- 0.50 packs/day    Types: Cigarettes    Start date:  12/20/1994  . Smokeless tobacco: Never Used  . Alcohol Use: No  . Drug Use: No  . Sexual Activity: No   Other Topics Concern  . Not on file   Social History Narrative    Current outpatient prescriptions:  .  ALPRAZolam (XANAX) 0.5 MG tablet, Take 1 tablet by mouth as needed., Disp: , Rfl:  .  Cholecalciferol 400 UNITS CAPS, Take 1 capsule by mouth 2 (two) times daily., Disp: , Rfl:  .  escitalopram (LEXAPRO) 10 MG tablet, TAKE 1 TABLET BY MOUTH EVERY MORNING, Disp: 30 tablet, Rfl: 2 .  levothyroxine (SYNTHROID, LEVOTHROID) 75 MCG tablet, Take 1 tablet (75 mcg total) by mouth every morning., Disp: 30 tablet, Rfl: 3 .  morphine (AVINZA) 60 MG 24 hr capsule, Take 1 capsule by mouth daily., Disp: , Rfl:  .  morphine (MS CONTIN) 60 MG 12 hr tablet, Take 1 tablet by mouth 3 (three) times daily., Disp: , Rfl:  .  oxyCODONE-acetaminophen (PERCOCET/ROXICET) 5-325 MG per tablet, Take 1 tablet by mouth as needed., Disp: , Rfl:  .  pantoprazole (PROTONIX) 40 MG tablet, Take 1 tablet by mouth daily., Disp: , Rfl:  .  QUEtiapine (SEROQUEL) 50 MG tablet, Take 1 tablet (50 mg total) by mouth at  bedtime., Disp: 30 tablet, Rfl: 3 .  tiZANidine (ZANAFLEX) 4 MG tablet, Take 1 tablet by mouth 3 (three) times daily., Disp: , Rfl:  .  VITAMIN D, CHOLECALCIFEROL, PO, Take by mouth., Disp: , Rfl:   No Known Allergies   Review of Systems  Constitutional: Negative for fever, chills, weight loss, malaise/fatigue and fatigue.  Gastrointestinal: Positive for constipation (opioid induced constipation).  Skin: Positive for rash.  Psychiatric/Behavioral: The patient is nervous/anxious.    Objective  Filed Vitals:   01/31/15 1430  BP: 100/75  Pulse: 82  Temp: 98.2 F (36.8 C)  TempSrc: Oral  Resp: 19  Height: 5\' 3"  (1.6 m)  Weight: 141 lb 9.6 oz (64.229 kg)  SpO2: 95%    Physical Exam  Constitutional: She is oriented to person, place, and time and well-developed, well-nourished, and in no distress.    Neck: No thyroid mass present.  Cardiovascular: Normal rate and regular rhythm.   Pulmonary/Chest: Effort normal and breath sounds normal.  Neurological: She is alert and oriented to person, place, and time.  Skin: Skin is warm and dry. Lesion noted.     Healing papular lesion over the right upper back. No drainage visible, no surrounding erythema.   Psychiatric: Memory, affect and judgment normal.  Nursing note and vitals reviewed.  Assessment & Plan  1. Skin lesion of back Reassured that the lesion does not appear to be a melanoma. Recommended  Routine care with water,  Pat drying and application of topical antibiotic. If it does not heal, referral to dermatology.   2. Other specified hypothyroidism  - TSH - T4, free - levothyroxine (SYNTHROID, LEVOTHROID) 75 MCG tablet; Take 1 tablet (75 mcg total) by mouth every morning.  Dispense: 90 tablet; Refill: 1     Cashlynn Yearwood [facility] 01/31/2015 2:54 PM

## 2015-02-21 DIAGNOSIS — M4722 Other spondylosis with radiculopathy, cervical region: Secondary | ICD-10-CM | POA: Diagnosis not present

## 2015-02-21 DIAGNOSIS — G894 Chronic pain syndrome: Secondary | ICD-10-CM | POA: Diagnosis not present

## 2015-02-21 DIAGNOSIS — Z79891 Long term (current) use of opiate analgesic: Secondary | ICD-10-CM | POA: Diagnosis not present

## 2015-02-21 DIAGNOSIS — M961 Postlaminectomy syndrome, not elsewhere classified: Secondary | ICD-10-CM | POA: Diagnosis not present

## 2015-03-17 ENCOUNTER — Ambulatory Visit: Payer: Self-pay | Admitting: Licensed Clinical Social Worker

## 2015-03-19 ENCOUNTER — Other Ambulatory Visit: Payer: Self-pay | Admitting: Psychiatry

## 2015-03-22 ENCOUNTER — Ambulatory Visit: Payer: Self-pay | Admitting: Psychiatry

## 2015-03-29 ENCOUNTER — Other Ambulatory Visit: Payer: Self-pay | Admitting: Psychiatry

## 2015-04-20 ENCOUNTER — Other Ambulatory Visit: Payer: Self-pay | Admitting: Psychiatry

## 2015-04-20 ENCOUNTER — Other Ambulatory Visit: Payer: Self-pay | Admitting: Family Medicine

## 2015-04-27 DIAGNOSIS — G894 Chronic pain syndrome: Secondary | ICD-10-CM | POA: Diagnosis not present

## 2015-04-27 DIAGNOSIS — M961 Postlaminectomy syndrome, not elsewhere classified: Secondary | ICD-10-CM | POA: Diagnosis not present

## 2015-04-27 DIAGNOSIS — M4722 Other spondylosis with radiculopathy, cervical region: Secondary | ICD-10-CM | POA: Diagnosis not present

## 2015-04-27 DIAGNOSIS — Z79891 Long term (current) use of opiate analgesic: Secondary | ICD-10-CM | POA: Diagnosis not present

## 2015-05-06 ENCOUNTER — Other Ambulatory Visit: Payer: Self-pay | Admitting: Psychiatry

## 2015-05-08 ENCOUNTER — Ambulatory Visit: Payer: Commercial Managed Care - HMO | Admitting: Psychiatry

## 2015-05-16 ENCOUNTER — Ambulatory Visit: Payer: Commercial Managed Care - HMO | Admitting: Psychiatry

## 2015-05-24 ENCOUNTER — Encounter: Payer: Self-pay | Admitting: Psychiatry

## 2015-05-24 ENCOUNTER — Ambulatory Visit (INDEPENDENT_AMBULATORY_CARE_PROVIDER_SITE_OTHER): Payer: Commercial Managed Care - HMO | Admitting: Psychiatry

## 2015-05-24 VITALS — BP 98/80 | HR 80 | Temp 97.2°F | Ht 63.0 in | Wt 139.0 lb

## 2015-05-24 DIAGNOSIS — F411 Generalized anxiety disorder: Secondary | ICD-10-CM

## 2015-05-24 DIAGNOSIS — R12 Heartburn: Secondary | ICD-10-CM | POA: Diagnosis not present

## 2015-05-24 DIAGNOSIS — F3170 Bipolar disorder, currently in remission, most recent episode unspecified: Secondary | ICD-10-CM | POA: Diagnosis not present

## 2015-05-24 MED ORDER — ESCITALOPRAM OXALATE 10 MG PO TABS: ORAL_TABLET | ORAL | Status: DC

## 2015-05-24 NOTE — Progress Notes (Addendum)
[facility] MD/PA/NP OP Progress Note  05/24/2015 12:10 PM KA GAYDON  MRN:  [id number]  Subjective:  Patient returns for follow-up of her bipolar 2 disorder and anxiety. She states overall things are going well for her. She discussed that his issue for her sometimes that she does not want to go places. She describes sometimes a worry and fear about going somewhere and then discusses that she might worry about other things such as even her dog at home. She states that usually she is fine when she arrives to a location. She also discusses that she tends not to do well with large crowds and that always been the case. She states she does well in small groups and with family. She fell overall stable on her current medications.  Chief Complaint: Anxiety Chief Complaint    Follow-up; Medication Refill     Visit Diagnosis:     ICD-9-CM ICD-10-CM   1. Bipolar disorder in full remission, most recent episode unspecified type ([place]) 296.80 F31.70   2. Anxiety state 300.00 F41.1     Past Medical History:  Past Medical History  Diagnosis Date  . Hypothyroidism, adult   . Bilateral shoulder pain   . Chronic pain of both shoulders   . Fatty liver disease, nonalcoholic   . Chronic neck pain   . Gastritis   . BP (high blood pressure)   . Encounter to establish care   . Hypothyroidism   . Transaminitis   . Bipolar disorder ([place])   . Abnormal exam/test finding   . Screening for depression   . Bipolar II disorder ([place])   . Anxiety     Past Surgical History  Procedure Laterality Date  . Neck surgery    . Bladder sugery     Family History:  Family History  Problem Relation Age of Onset  . Depression Mother   . Ulcers Sister   . Breast cancer Paternal Grandmother    Social History:  Social History   Social History  . Marital Status: Married    Spouse Name: N/A  . Number of Children: N/A  . Years of Education: N/A   Social History Main Topics  . Smoking status: Current Every Day Smoker  -- 0.50 packs/day    Types: Cigarettes    Start date: 12/20/1994  . Smokeless tobacco: Never Used  . Alcohol Use: No  . Drug Use: No  . Sexual Activity: No   Other Topics Concern  . None   Social History Narrative   Additional History:   Assessment:   Musculoskeletal: Strength & Muscle Tone: within normal limits Gait & Station: normal Patient leans: N/A  Psychiatric Specialty Exam: HPI  Review of Systems  Psychiatric/Behavioral: Negative for depression, suicidal ideas, hallucinations, memory loss and substance abuse. The patient is nervous/anxious (About leaving her house as discussed above). The patient does not have insomnia.   All other systems reviewed and are negative.   Blood pressure 98/80, pulse 80, temperature 97.2 F (36.2 C), temperature source Tympanic, height 5\' 3"  (1.6 m), weight 139 lb (63.05 kg), SpO2 94 %.Body mass index is 24.63 kg/(m^2).  General Appearance: Neat and Well Groomed  Eye Contact:  Good  Speech:  Normal Rate  Volume:  Normal  Mood:  Good  Affect:  bright  Thought Process:  Linear and Logical  Orientation:  Full (Time, Place, and Person)  Thought Content:  Negative  Suicidal Thoughts:  No  Homicidal Thoughts:  No  Memory:  Immediate;  Good Recent;   Good Remote;   Good  Judgement:  Good  Insight:  Good  Psychomotor Activity:  Negative  Concentration:  Good  Recall:  Good  Fund of Knowledge: Good  Language: Good  Akathisia:  Negative  Handed:  Right  AIMS (if indicated):  Done 05/24/15  Assets:  Communication Skills Desire for Improvement Social Support  ADL's:  Intact  Cognition: WNL  Sleep:  good   Is the patient at risk to self?  No. Has the patient been a risk to self in the past 6 months?  No. Has the patient been a risk to self within the distant past?  No. Is the patient a risk to others?  No. Has the patient been a risk to others in the past 6 months?  No. Has the patient been a risk to others within the distant  past?  No.  Current Medications: Current Outpatient Prescriptions  Medication Sig Dispense Refill  . ALPRAZolam (XANAX) 0.5 MG tablet Take 1 tablet by mouth as needed.    . Cholecalciferol 400 UNITS CAPS Take 1 capsule by mouth 2 (two) times daily.    Marland Kitchen escitalopram (LEXAPRO) 10 MG tablet TAKE 1 TABLET (10 MG TOTAL) BY MOUTH EVERY MORNING. 30 tablet 4  . levothyroxine (SYNTHROID, LEVOTHROID) 75 MCG tablet TAKE 1 TABLET (75 MCG TOTAL) BY MOUTH EVERY MORNING. 30 tablet 2  . morphine (MS CONTIN) 60 MG 12 hr tablet Take 1 tablet by mouth 3 (three) times daily.    Marland Kitchen oxyCODONE-acetaminophen (PERCOCET/ROXICET) 5-325 MG per tablet Take 1 tablet by mouth as needed.    . pantoprazole (PROTONIX) 40 MG tablet Take 1 tablet by mouth daily.    . QUEtiapine (SEROQUEL) 50 MG tablet TAKE 1 TABLET (50 MG TOTAL) BY MOUTH AT BEDTIME. 30 tablet 4  . tiZANidine (ZANAFLEX) 4 MG tablet Take 1 tablet by mouth 3 (three) times daily.    Marland Kitchen VITAMIN D, CHOLECALCIFEROL, PO Take by mouth.     No current facility-administered medications for this visit.    Medical Decision Making:  Established Problem, Stable/Improving (1) and Review of Medication Regimen & Side Effects (2)  Treatment Plan Summary:Medication management and Plan   Bipolar 2-she has been stable on her current regimen. We'll continue the patient's Lexapro 10 mg a day and Seroquel 50 mg. I've given her prescription of metabolic labs drawn related to Seroquel.    I made her aware of my departure from the practice and that she can continue in the practice with another provider. She will follow up in 3 months.    Faith Rogue 05/24/2015, 12:10 PM

## 2015-06-16 ENCOUNTER — Other Ambulatory Visit: Payer: Self-pay | Admitting: Psychiatry

## 2015-07-07 DIAGNOSIS — Z79891 Long term (current) use of opiate analgesic: Secondary | ICD-10-CM | POA: Diagnosis not present

## 2015-07-07 DIAGNOSIS — M961 Postlaminectomy syndrome, not elsewhere classified: Secondary | ICD-10-CM | POA: Diagnosis not present

## 2015-07-07 DIAGNOSIS — G894 Chronic pain syndrome: Secondary | ICD-10-CM | POA: Diagnosis not present

## 2015-07-07 DIAGNOSIS — M4722 Other spondylosis with radiculopathy, cervical region: Secondary | ICD-10-CM | POA: Diagnosis not present

## 2015-07-14 ENCOUNTER — Other Ambulatory Visit: Payer: Self-pay | Admitting: Family Medicine

## 2015-08-18 ENCOUNTER — Other Ambulatory Visit: Payer: Self-pay | Admitting: Family Medicine

## 2015-08-22 ENCOUNTER — Ambulatory Visit: Payer: Commercial Managed Care - HMO | Admitting: Psychiatry

## 2015-08-30 ENCOUNTER — Telehealth: Payer: Self-pay | Admitting: Family Medicine

## 2015-08-30 DIAGNOSIS — E039 Hypothyroidism, unspecified: Secondary | ICD-10-CM

## 2015-08-30 MED ORDER — LEVOTHYROXINE SODIUM 75 MCG PO TABS: ORAL_TABLET | ORAL | Status: DC

## 2015-08-30 NOTE — Telephone Encounter (Signed)
Patient has appointment for Monday 09-04-15 but will be completely out of Synthyroid 75mg  by then. Requesting refill to be sent to [facility] [address]

## 2015-09-01 DIAGNOSIS — M4722 Other spondylosis with radiculopathy, cervical region: Secondary | ICD-10-CM | POA: Diagnosis not present

## 2015-09-01 DIAGNOSIS — Z79891 Long term (current) use of opiate analgesic: Secondary | ICD-10-CM | POA: Diagnosis not present

## 2015-09-01 DIAGNOSIS — G894 Chronic pain syndrome: Secondary | ICD-10-CM | POA: Diagnosis not present

## 2015-09-01 DIAGNOSIS — M961 Postlaminectomy syndrome, not elsewhere classified: Secondary | ICD-10-CM | POA: Diagnosis not present

## 2015-09-04 ENCOUNTER — Ambulatory Visit: Payer: Self-pay | Admitting: Family Medicine

## 2015-10-02 ENCOUNTER — Ambulatory Visit: Payer: Self-pay | Admitting: Family Medicine

## 2015-10-03 ENCOUNTER — Ambulatory Visit: Payer: Self-pay | Admitting: Family Medicine

## 2015-10-04 ENCOUNTER — Encounter: Payer: Self-pay | Admitting: Family Medicine

## 2015-10-04 ENCOUNTER — Ambulatory Visit (INDEPENDENT_AMBULATORY_CARE_PROVIDER_SITE_OTHER): Payer: Commercial Managed Care - HMO | Admitting: Family Medicine

## 2015-10-04 VITALS — BP 100/71 | HR 74 | Temp 98.0°F | Resp 15 | Ht 63.0 in | Wt 138.6 lb

## 2015-10-04 DIAGNOSIS — E039 Hypothyroidism, unspecified: Secondary | ICD-10-CM

## 2015-10-04 MED ORDER — LEVOTHYROXINE SODIUM 75 MCG PO TABS: ORAL_TABLET | ORAL | Status: DC

## 2015-10-04 NOTE — Progress Notes (Signed)
Name: Anna Giles   MRN: [id number]    DOB: 05-10-1960   Date:10/04/2015       Progress Note  Subjective  Chief Complaint  Chief Complaint  Patient presents with  . Follow-up    thyroid check    Thyroid Problem Presents for follow-up visit. The condition has lasted for 30 years. Symptoms include depressed mood (pt. has depression since her teens.) and weight loss. Patient reports no cold intolerance, dry skin, fatigue, hair loss or leg swelling. The symptoms have been stable. Past treatments include levothyroxine.     Past Medical History  Diagnosis Date  . Hypothyroidism, adult   . Bilateral shoulder pain   . Chronic pain of both shoulders   . Fatty liver disease, nonalcoholic   . Chronic neck pain   . Gastritis   . BP (high blood pressure)   . Encounter to establish care   . Hypothyroidism   . Transaminitis   . Bipolar disorder ([place])   . Abnormal exam/test finding   . Screening for depression   . Bipolar II disorder ([place])   . Anxiety     Past Surgical History  Procedure Laterality Date  . Neck surgery    . Bladder sugery      Family History  Problem Relation Age of Onset  . Depression Mother   . Ulcers Sister   . Breast cancer Paternal Grandmother     Social History   Social History  . Marital Status: Married    Spouse Name: N/A  . Number of Children: N/A  . Years of Education: N/A   Occupational History  . Not on file.   Social History Main Topics  . Smoking status: Current Every Day Smoker -- 0.50 packs/day    Types: Cigarettes    Start date: 12/20/1994  . Smokeless tobacco: Never Used  . Alcohol Use: No  . Drug Use: No  . Sexual Activity: No   Other Topics Concern  . Not on file   Social History Narrative     Current outpatient prescriptions:  .  ALPRAZolam (XANAX) 0.5 MG tablet, Take 1 tablet by mouth as needed., Disp: , Rfl:  .  Cholecalciferol 400 UNITS CAPS, Take 1 capsule by mouth 2 (two) times daily., Disp: , Rfl:  .   escitalopram (LEXAPRO) 10 MG tablet, TAKE 1 TABLET (10 MG TOTAL) BY MOUTH EVERY MORNING., Disp: 30 tablet, Rfl: 4 .  levothyroxine (SYNTHROID, LEVOTHROID) 75 MCG tablet, TAKE 1 TABLET (75 MCG TOTAL) BY MOUTH EVERY MORNING., Disp: 30 tablet, Rfl: 0 .  morphine (MS CONTIN) 60 MG 12 hr tablet, Take 1 tablet by mouth 3 (three) times daily., Disp: , Rfl:  .  oxyCODONE-acetaminophen (PERCOCET/ROXICET) 5-325 MG per tablet, Take 1 tablet by mouth as needed., Disp: , Rfl:  .  pantoprazole (PROTONIX) 40 MG tablet, Take 1 tablet by mouth daily., Disp: , Rfl:  .  QUEtiapine (SEROQUEL) 50 MG tablet, TAKE 1 TABLET (50 MG TOTAL) BY MOUTH AT BEDTIME., Disp: 30 tablet, Rfl: 4 .  tiZANidine (ZANAFLEX) 4 MG tablet, Take 1 tablet by mouth 3 (three) times daily., Disp: , Rfl:  .  VITAMIN D, CHOLECALCIFEROL, PO, Take by mouth., Disp: , Rfl:   No Known Allergies   Review of Systems  Constitutional: Positive for weight loss. Negative for malaise/fatigue and fatigue.  Endo/Heme/Allergies: Negative for cold intolerance.  Psychiatric/Behavioral: Positive for depression.    Objective  Filed Vitals:   10/04/15 1344  BP: 100/71  Pulse: 74  Temp: 98 F (36.7 C)  TempSrc: Oral  Resp: 15  Height: 5\' 3"  (1.6 m)  Weight: 138 lb 9.6 oz (62.869 kg)  SpO2: 98%    Physical Exam  Constitutional: She is oriented to person, place, and time and well-developed, well-nourished, and in no distress.  Neck: No thyroid mass and no thyromegaly present.  Cardiovascular: Normal rate and regular rhythm.   Pulmonary/Chest: Effort normal and breath sounds normal.  Neurological: She is alert and oriented to person, place, and time.  Nursing note and vitals reviewed.      Assessment & Plan  1. Hypothyroidism, unspecified hypothyroidism type Stable, recheck TSH. Refill for Levothyroxine was provided   - levothyroxine (SYNTHROID, LEVOTHROID) 75 MCG tablet; TAKE 1 TABLET (75 MCG TOTAL) BY MOUTH EVERY MORNING.  Dispense: 90  tablet; Refill: 0 - TSH   Kennith Morss [facility] 10/04/2015 1:55 PM

## 2015-10-05 ENCOUNTER — Other Ambulatory Visit: Payer: Self-pay | Admitting: Family Medicine

## 2015-10-05 DIAGNOSIS — E039 Hypothyroidism, unspecified: Secondary | ICD-10-CM

## 2015-10-05 LAB — TSH: TSH: 5.3 u[IU]/mL — ABNORMAL HIGH (ref 0.450–4.500)

## 2015-10-05 MED ORDER — LEVOTHYROXINE SODIUM 88 MCG PO TABS: 88.0000 ug | ORAL_TABLET | Freq: Every day | ORAL | Status: DC

## 2015-11-06 DIAGNOSIS — M4722 Other spondylosis with radiculopathy, cervical region: Secondary | ICD-10-CM | POA: Diagnosis not present

## 2015-11-06 DIAGNOSIS — Z79891 Long term (current) use of opiate analgesic: Secondary | ICD-10-CM | POA: Diagnosis not present

## 2015-11-06 DIAGNOSIS — M961 Postlaminectomy syndrome, not elsewhere classified: Secondary | ICD-10-CM | POA: Diagnosis not present

## 2015-11-06 DIAGNOSIS — G894 Chronic pain syndrome: Secondary | ICD-10-CM | POA: Diagnosis not present

## 2016-01-02 DIAGNOSIS — M961 Postlaminectomy syndrome, not elsewhere classified: Secondary | ICD-10-CM | POA: Diagnosis not present

## 2016-01-02 DIAGNOSIS — Z79891 Long term (current) use of opiate analgesic: Secondary | ICD-10-CM | POA: Diagnosis not present

## 2016-01-02 DIAGNOSIS — G894 Chronic pain syndrome: Secondary | ICD-10-CM | POA: Diagnosis not present

## 2016-01-02 DIAGNOSIS — M4722 Other spondylosis with radiculopathy, cervical region: Secondary | ICD-10-CM | POA: Diagnosis not present

## 2016-01-07 ENCOUNTER — Other Ambulatory Visit: Payer: Self-pay | Admitting: Family Medicine

## 2016-01-07 DIAGNOSIS — E039 Hypothyroidism, unspecified: Secondary | ICD-10-CM

## 2016-01-10 ENCOUNTER — Encounter: Payer: Self-pay | Admitting: Psychiatry

## 2016-01-10 ENCOUNTER — Ambulatory Visit (INDEPENDENT_AMBULATORY_CARE_PROVIDER_SITE_OTHER): Payer: Commercial Managed Care - HMO | Admitting: Psychiatry

## 2016-01-10 VITALS — BP 105/71 | HR 60 | Temp 98.2°F | Ht 63.0 in | Wt 139.6 lb

## 2016-01-10 DIAGNOSIS — F3170 Bipolar disorder, currently in remission, most recent episode unspecified: Secondary | ICD-10-CM | POA: Diagnosis not present

## 2016-01-10 MED ORDER — QUETIAPINE FUMARATE 25 MG PO TABS: 25.0000 mg | ORAL_TABLET | Freq: Two times a day (BID) | ORAL | 2 refills | Status: DC

## 2016-01-10 MED ORDER — ESCITALOPRAM OXALATE 10 MG PO TABS: ORAL_TABLET | ORAL | 4 refills | Status: DC

## 2016-01-10 NOTE — Progress Notes (Signed)
[facility] MD/PA/NP OP Progress Note  01/10/2016 2:40 PM Anna Giles  MRN:  [id number]  Subjective:  Patient returns for follow-up of her bipolar 2 disorder and anxiety. Patient was seen previously by Dr.williams. She reports doing well on the Lexapro and Seroquel States she has cut the Seroquel to 25mg  since it has been making her groggy. States this combination is working well for her. Some anxiety when she has to leave the house. Denies any manic symptoms. Denies any suicidal thoughts.  Chief Complaint: doing better  Visit Diagnosis:     ICD-9-CM ICD-10-CM   1. Bipolar disorder in full remission, most recent episode unspecified type ([place]) 296.80 F31.70     Past Medical History:  Past Medical History:  Diagnosis Date  . Abnormal exam/test finding   . Anxiety   . Bilateral shoulder pain   . Bipolar disorder ([place])   . Bipolar II disorder ([place])   . BP (high blood pressure)   . Chronic neck pain   . Chronic pain of both shoulders   . Encounter to establish care   . Fatty liver disease, nonalcoholic   . Gastritis   . Hypothyroidism   . Hypothyroidism, adult   . Screening for depression   . Transaminitis     Past Surgical History:  Procedure Laterality Date  . bladder sugery    . NECK SURGERY     Family History:  Family History  Problem Relation Age of Onset  . Depression Mother   . Ulcers Sister   . Breast cancer Paternal Grandmother    Social History:  Social History   Social History  . Marital status: Married    Spouse name: N/A  . Number of children: N/A  . Years of education: N/A   Social History Main Topics  . Smoking status: Current Every Day Smoker    Packs/day: 0.50    Types: Cigarettes    Start date: 12/20/1994  . Smokeless tobacco: Never Used  . Alcohol use No  . Drug use: No  . Sexual activity: No   Other Topics Concern  . Not on file   Social History Narrative  . No narrative on file   Additional History:   Assessment:    Musculoskeletal: Strength & Muscle Tone: within normal limits Gait & Station: normal Patient leans: N/A  Psychiatric Specialty Exam: HPI  Review of Systems  Psychiatric/Behavioral: Negative for depression, hallucinations, memory loss, substance abuse and suicidal ideas. The patient is nervous/anxious (About leaving her house as discussed above). The patient does not have insomnia.   All other systems reviewed and are negative.   There were no vitals taken for this visit.There is no height or weight on file to calculate BMI.  General Appearance: Neat and Well Groomed  Eye Contact:  Good  Speech:  Normal Rate  Volume:  Normal  Mood:  Good  Affect:  bright  Thought Process:  Linear and Logical  Orientation:  Full (Time, Place, and Person)  Thought Content:  Negative  Suicidal Thoughts:  No  Homicidal Thoughts:  No  Memory:  Immediate;   Good Recent;   Good Remote;   Good  Judgement:  Good  Insight:  Good  Psychomotor Activity:  Negative  Concentration:  Good  Recall:  Good  Fund of Knowledge: Good  Language: Good  Akathisia:  Negative  Handed:  Right  AIMS (if indicated):  Done 05/24/15  Assets:  Communication Skills Desire for Improvement Social Support  ADL's:  Intact  Cognition: WNL  Sleep:  good   Is the patient at risk to self?  No. Has the patient been a risk to self in the past 6 months?  No. Has the patient been a risk to self within the distant past?  No. Is the patient a risk to others?  No. Has the patient been a risk to others in the past 6 months?  No. Has the patient been a risk to others within the distant past?  No.  Current Medications: Current Outpatient Prescriptions  Medication Sig Dispense Refill  . ALPRAZolam (XANAX) 0.5 MG tablet Take 1 tablet by mouth as needed.    . Cholecalciferol 400 UNITS CAPS Take 1 capsule by mouth 2 (two) times daily.    Marland Kitchen escitalopram (LEXAPRO) 10 MG tablet TAKE 1 TABLET (10 MG TOTAL) BY MOUTH EVERY MORNING. 30  tablet 4  . levothyroxine (SYNTHROID, LEVOTHROID) 88 MCG tablet Take 1 tablet (88 mcg total) by mouth daily before breakfast. 30 tablet 2  . morphine (MS CONTIN) 60 MG 12 hr tablet Take 1 tablet by mouth 3 (three) times daily.    Marland Kitchen oxyCODONE-acetaminophen (PERCOCET/ROXICET) 5-325 MG per tablet Take 1 tablet by mouth as needed.    . pantoprazole (PROTONIX) 40 MG tablet Take 1 tablet by mouth daily.    . QUEtiapine (SEROQUEL) 25 MG tablet Take 1 tablet (25 mg total) by mouth 2 (two) times daily. 60 tablet 2  . tiZANidine (ZANAFLEX) 4 MG tablet Take 1 tablet by mouth 3 (three) times daily.    Marland Kitchen VITAMIN D, CHOLECALCIFEROL, PO Take by mouth.     No current facility-administered medications for this visit.     Medical Decision Making:  Established Problem, Stable/Improving (1) and Review of Medication Regimen & Side Effects (2)  Treatment Plan Summary:Medication management and Plan   Bipolar 2-she has been stable on her current regimen. We'll continue the patient's Lexapro 10 mg a day  Decrease Seroquel to 25mg  po qd.  Patient will need letter for her dog to be accepted to the housing when she receives it.  Encourage to continue healthy diet and exercise. RTC in 3 months or call before if necessary.    Anna Giles 01/10/2016, 2:40 PM

## 2016-01-11 ENCOUNTER — Other Ambulatory Visit: Payer: Self-pay | Admitting: Family Medicine

## 2016-01-11 ENCOUNTER — Telehealth: Payer: Self-pay | Admitting: Family Medicine

## 2016-01-11 DIAGNOSIS — E039 Hypothyroidism, unspecified: Secondary | ICD-10-CM

## 2016-01-11 MED ORDER — LEVOTHYROXINE SODIUM 88 MCG PO TABS: 88.0000 ug | ORAL_TABLET | Freq: Every day | ORAL | 0 refills | Status: DC

## 2016-01-11 NOTE — Telephone Encounter (Signed)
Refill for Synthroid 88 g daily is sent to patient's pharmacy for 30 days

## 2016-01-12 NOTE — Telephone Encounter (Signed)
Pt notified and reminder her of appt

## 2016-01-16 ENCOUNTER — Ambulatory Visit: Payer: Self-pay | Admitting: Family Medicine

## 2016-01-24 ENCOUNTER — Encounter: Payer: Self-pay | Admitting: Family Medicine

## 2016-01-24 ENCOUNTER — Ambulatory Visit (INDEPENDENT_AMBULATORY_CARE_PROVIDER_SITE_OTHER): Payer: Commercial Managed Care - HMO | Admitting: Family Medicine

## 2016-01-24 DIAGNOSIS — E039 Hypothyroidism, unspecified: Secondary | ICD-10-CM | POA: Diagnosis not present

## 2016-01-24 LAB — T4, FREE: FREE T4: 1.3 ng/dL (ref 0.8–1.8)

## 2016-01-24 LAB — TSH: TSH: 2.74 mIU/L

## 2016-01-24 MED ORDER — LEVOTHYROXINE SODIUM 88 MCG PO TABS: 88.0000 ug | ORAL_TABLET | Freq: Every day | ORAL | 1 refills | Status: DC

## 2016-01-24 NOTE — Progress Notes (Signed)
Name: Anna Giles   MRN: [id number]    DOB: 1960/06/09   Date:01/24/2016       Progress Note  Subjective  Chief Complaint  Chief Complaint  Patient presents with  . Thyroid Problem    Thyroid Problem  Presents for follow-up visit. Patient reports no cold intolerance, constipation, depressed mood, dry skin or fatigue.     Past Medical History:  Diagnosis Date  . Abnormal exam/test finding   . Anxiety   . Bilateral shoulder pain   . Bipolar disorder ([place])   . Bipolar II disorder ([place])   . BP (high blood pressure)   . Chronic neck pain   . Chronic pain of both shoulders   . Encounter to establish care   . Fatty liver disease, nonalcoholic   . Gastritis   . Hypothyroidism   . Hypothyroidism, adult   . Screening for depression   . Transaminitis     Past Surgical History:  Procedure Laterality Date  . bladder sugery    . NECK SURGERY      Family History  Problem Relation Age of Onset  . Depression Mother   . Ulcers Sister   . Breast cancer Paternal Grandmother     Social History   Social History  . Marital status: Married    Spouse name: N/A  . Number of children: N/A  . Years of education: N/A   Occupational History  . Not on file.   Social History Main Topics  . Smoking status: Current Every Day Smoker    Packs/day: 0.50    Types: Cigarettes    Start date: 12/20/1994  . Smokeless tobacco: Never Used  . Alcohol use No  . Drug use: No  . Sexual activity: No   Other Topics Concern  . Not on file   Social History Narrative  . No narrative on file     Current Outpatient Prescriptions:  .  ALPRAZolam (XANAX) 0.5 MG tablet, Take 1 tablet by mouth as needed., Disp: , Rfl:  .  Cholecalciferol 400 UNITS CAPS, Take 1 capsule by mouth 2 (two) times daily., Disp: , Rfl:  .  escitalopram (LEXAPRO) 10 MG tablet, TAKE 1 TABLET (10 MG TOTAL) BY MOUTH EVERY MORNING., Disp: 30 tablet, Rfl: 4 .  levothyroxine (SYNTHROID, LEVOTHROID) 88 MCG tablet, Take 1  tablet (88 mcg total) by mouth daily before breakfast., Disp: 30 tablet, Rfl: 0 .  morphine (MS CONTIN) 60 MG 12 hr tablet, Take 1 tablet by mouth 3 (three) times daily., Disp: , Rfl:  .  oxyCODONE-acetaminophen (PERCOCET/ROXICET) 5-325 MG per tablet, Take 1 tablet by mouth as needed., Disp: , Rfl:  .  pantoprazole (PROTONIX) 40 MG tablet, Take 1 tablet by mouth daily., Disp: , Rfl:  .  QUEtiapine (SEROQUEL) 25 MG tablet, Take 1 tablet (25 mg total) by mouth 2 (two) times daily., Disp: 60 tablet, Rfl: 2 .  tiZANidine (ZANAFLEX) 4 MG tablet, Take 1 tablet by mouth 3 (three) times daily., Disp: , Rfl:  .  VITAMIN D, CHOLECALCIFEROL, PO, Take by mouth., Disp: , Rfl:   No Known Allergies   Review of Systems  Constitutional: Negative for fatigue.  Gastrointestinal: Negative for constipation.  Endo/Heme/Allergies: Negative for cold intolerance.     Objective  Vitals:   01/24/16 1511  BP: 110/71  Pulse: 70  Resp: 16  Temp: 98.1 F (36.7 C)  TempSrc: Oral  SpO2: 98%  Weight: 140 lb 8 oz (63.7 kg)  Height: 5\' 3"  (1.6  m)    Physical Exam  Constitutional: She is oriented to person, place, and time and well-developed, well-nourished, and in no distress.  Neck: No thyroid mass and no thyromegaly present.  Cardiovascular: Normal rate, regular rhythm, S1 normal, S2 normal and normal heart sounds.   No murmur heard. Pulmonary/Chest: Effort normal and breath sounds normal.  Neurological: She is alert and oriented to person, place, and time.  Psychiatric: Mood, memory, affect and judgment normal.  Nursing note and vitals reviewed.   Assessment & Plan  1. Hypothyroidism, unspecified hypothyroidism type  - TSH - T4, free - levothyroxine (SYNTHROID, LEVOTHROID) 88 MCG tablet; Take 1 tablet (88 mcg total) by mouth daily before breakfast.  Dispense: 90 tablet; Refill: 1   Marceline Napierala [facility] 01/24/2016 3:17 PM

## 2016-02-19 NOTE — Progress Notes (Signed)
Pt still taking.

## 2016-02-20 ENCOUNTER — Ambulatory Visit: Payer: Self-pay | Admitting: Family Medicine

## 2016-03-11 DIAGNOSIS — G894 Chronic pain syndrome: Secondary | ICD-10-CM | POA: Diagnosis not present

## 2016-03-11 DIAGNOSIS — M961 Postlaminectomy syndrome, not elsewhere classified: Secondary | ICD-10-CM | POA: Diagnosis not present

## 2016-03-11 DIAGNOSIS — M4722 Other spondylosis with radiculopathy, cervical region: Secondary | ICD-10-CM | POA: Diagnosis not present

## 2016-03-11 DIAGNOSIS — Z79891 Long term (current) use of opiate analgesic: Secondary | ICD-10-CM | POA: Diagnosis not present

## 2016-04-06 ENCOUNTER — Other Ambulatory Visit: Payer: Self-pay | Admitting: Psychiatry

## 2016-04-10 ENCOUNTER — Ambulatory Visit: Payer: Self-pay | Admitting: Psychiatry

## 2016-05-07 DIAGNOSIS — M4722 Other spondylosis with radiculopathy, cervical region: Secondary | ICD-10-CM | POA: Diagnosis not present

## 2016-05-07 DIAGNOSIS — M961 Postlaminectomy syndrome, not elsewhere classified: Secondary | ICD-10-CM | POA: Diagnosis not present

## 2016-05-07 DIAGNOSIS — Z79891 Long term (current) use of opiate analgesic: Secondary | ICD-10-CM | POA: Diagnosis not present

## 2016-05-07 DIAGNOSIS — G894 Chronic pain syndrome: Secondary | ICD-10-CM | POA: Diagnosis not present

## 2016-05-24 ENCOUNTER — Ambulatory Visit (INDEPENDENT_AMBULATORY_CARE_PROVIDER_SITE_OTHER): Payer: Commercial Managed Care - HMO | Admitting: Psychiatry

## 2016-05-24 ENCOUNTER — Encounter: Payer: Self-pay | Admitting: Psychiatry

## 2016-05-24 VITALS — BP 105/72 | HR 76 | Temp 98.8°F | Wt 145.6 lb

## 2016-05-24 DIAGNOSIS — F3131 Bipolar disorder, current episode depressed, mild: Secondary | ICD-10-CM

## 2016-05-24 DIAGNOSIS — F411 Generalized anxiety disorder: Secondary | ICD-10-CM

## 2016-05-24 MED ORDER — ESCITALOPRAM OXALATE 10 MG PO TABS: 15.0000 mg | ORAL_TABLET | Freq: Every day | ORAL | 1 refills | Status: DC

## 2016-05-24 MED ORDER — BUSPIRONE HCL 10 MG PO TABS: 10.0000 mg | ORAL_TABLET | ORAL | 1 refills | Status: DC

## 2016-05-24 MED ORDER — QUETIAPINE FUMARATE 25 MG PO TABS: 25.0000 mg | ORAL_TABLET | Freq: Every day | ORAL | 2 refills | Status: DC

## 2016-05-24 NOTE — Progress Notes (Signed)
[facility] MD/PA/NP OP Progress Note  05/24/2016 11:05 AM Anna Giles  MRN:  [id number]  Subjective:  Patient is a 56 yo female with history of bipolar and anxiety presented for follow-up. She was following Dr. Einar Grad and came for the follow-up as Dr. Einar Grad is on vacation. She reported that she has severe anxiety. Patient reported that she continues to have anxiety and wants to have her medication adjusted. She reported that she is unable to take a higher dose of Seroquel. She appeared apprehensive during the interview. Patient reported that she has been taking Xanax which was started by her pain management doctor as they do not want to give her Xanax at this time. She is willing to try a different medication. She currently denied having any suicidal homicidal ideations or plans. She appeared calm and alert during the interview. We discussed about starting her on a different medication and she agreed with the plan.    Chief Complaint:  Chief Complaint    Follow-up; Medication Refill     Visit Diagnosis:     ICD-9-CM ICD-10-CM   1. Bipolar affective disorder, currently depressed, mild ([place]) 296.51 F31.31   2. Anxiety state 300.00 F41.1     Past Medical History:  Past Medical History:  Diagnosis Date  . Abnormal exam/test finding   . Anxiety   . Bilateral shoulder pain   . Bipolar disorder ([place])   . Bipolar II disorder ([place])   . BP (high blood pressure)   . Chronic neck pain   . Chronic pain of both shoulders   . Encounter to establish care   . Fatty liver disease, nonalcoholic   . Gastritis   . Hypothyroidism   . Hypothyroidism, adult   . Screening for depression   . Transaminitis     Past Surgical History:  Procedure Laterality Date  . bladder sugery    . NECK SURGERY     Family History:  Family History  Problem Relation Age of Onset  . Depression Mother   . Ulcers Sister   . Breast cancer Paternal Grandmother    Social History:  Social History   Social History  .  Marital status: Married    Spouse name: N/A  . Number of children: N/A  . Years of education: N/A   Social History Main Topics  . Smoking status: Current Every Day Smoker    Packs/day: 0.50    Types: Cigarettes    Start date: 12/20/1994  . Smokeless tobacco: Never Used  . Alcohol use No  . Drug use: No  . Sexual activity: No   Other Topics Concern  . None   Social History Narrative  . None   Additional History:   Assessment:   Musculoskeletal: Strength & Muscle Tone: within normal limits Gait & Station: normal Patient leans: N/A  Psychiatric Specialty Exam: HPI  Review of Systems  Psychiatric/Behavioral: Negative for depression, hallucinations, memory loss, substance abuse and suicidal ideas. The patient is nervous/anxious (About leaving her house as discussed above). The patient does not have insomnia.   All other systems reviewed and are negative.   Blood pressure 105/72, pulse 76, temperature 98.8 F (37.1 C), temperature source Oral, weight 145 lb 9.6 oz (66 kg).Body mass index is 25.79 kg/m.  General Appearance: Neat and Well Groomed  Eye Contact:  Good  Speech:  Normal Rate  Volume:  Normal  Mood:  Good  Affect:  bright  Thought Process:  Linear and Logical  Orientation:  Full (Time,  Place, and Person)  Thought Content:  Negative  Suicidal Thoughts:  No  Homicidal Thoughts:  No  Memory:  Immediate;   Good Recent;   Good Remote;   Good  Judgement:  Good  Insight:  Good  Psychomotor Activity:  Negative  Concentration:  Good  Recall:  Good  Fund of Knowledge: Good  Language: Good  Akathisia:  Negative  Handed:  Right  AIMS (if indicated):  Done 05/24/15  Assets:  Communication Skills Desire for Improvement Social Support  ADL's:  Intact  Cognition: WNL  Sleep:  good   Is the patient at risk to self?  No. Has the patient been a risk to self in the past 6 months?  No. Has the patient been a risk to self within the distant past?  No. Is the  patient a risk to others?  No. Has the patient been a risk to others in the past 6 months?  No. Has the patient been a risk to others within the distant past?  No.  Current Medications: Current Outpatient Prescriptions  Medication Sig Dispense Refill  . Cholecalciferol 400 UNITS CAPS Take 1 capsule by mouth 2 (two) times daily.    Marland Kitchen escitalopram (LEXAPRO) 10 MG tablet Take 1.5 tablets (15 mg total) by mouth daily. TAKE 1 TABLET (10 MG TOTAL) BY MOUTH EVERY MORNING. 45 tablet 1  . levothyroxine (SYNTHROID, LEVOTHROID) 88 MCG tablet Take 1 tablet (88 mcg total) by mouth daily before breakfast. 90 tablet 1  . morphine (MS CONTIN) 60 MG 12 hr tablet Take 1 tablet by mouth 3 (three) times daily.    Marland Kitchen oxyCODONE-acetaminophen (PERCOCET/ROXICET) 5-325 MG per tablet Take 1 tablet by mouth as needed.    . pantoprazole (PROTONIX) 40 MG tablet Take 1 tablet by mouth daily.    . QUEtiapine (SEROQUEL) 25 MG tablet Take 1 tablet (25 mg total) by mouth at bedtime. 30 tablet 2  . tiZANidine (ZANAFLEX) 4 MG tablet Take 1 tablet by mouth 3 (three) times daily.    Marland Kitchen VITAMIN D, CHOLECALCIFEROL, PO Take by mouth.    . busPIRone (BUSPAR) 10 MG tablet Take 1 tablet (10 mg total) by mouth every morning. 30 tablet 1   No current facility-administered medications for this visit.     Medical Decision Making:  Established Problem, Stable/Improving (1) and Review of Medication Regimen & Side Effects (2)  Treatment Plan Summary:Medication management and Plan   Discussed with patient about starting her on Lexapro 15 mg daily to help with her anxiety symptoms and she agreed. Will also start her on BuSpar 10 mg daily. Advised patient and she started improving on the medication the dose can be titrated at her next appointment by Dr. Einar Grad.  Will continue on Seroquel 25 mg by mouth daily at bedtime  Patient will need letter for her dog to be accepted to the housing when she receives it.  Encourage to continue healthy diet  and exercise. RTC in 1 months or call before if necessary.    More than 50% of the time spent in psychoeducation, counseling and coordination of care.    This note was generated in part or whole with voice recognition software. Voice regonition is usually quite accurate but there are transcription errors that can and very often do occur. I apologize for any typographical errors that were not detected and corrected.    Rainey Pines 05/24/2016, 11:05 AM

## 2016-06-07 ENCOUNTER — Other Ambulatory Visit: Payer: Self-pay | Admitting: Psychiatry

## 2016-07-20 ENCOUNTER — Other Ambulatory Visit: Payer: Self-pay | Admitting: Psychiatry

## 2016-07-23 DIAGNOSIS — G894 Chronic pain syndrome: Secondary | ICD-10-CM | POA: Diagnosis not present

## 2016-07-23 DIAGNOSIS — M961 Postlaminectomy syndrome, not elsewhere classified: Secondary | ICD-10-CM | POA: Diagnosis not present

## 2016-07-23 DIAGNOSIS — Z79891 Long term (current) use of opiate analgesic: Secondary | ICD-10-CM | POA: Diagnosis not present

## 2016-07-23 DIAGNOSIS — M4722 Other spondylosis with radiculopathy, cervical region: Secondary | ICD-10-CM | POA: Diagnosis not present

## 2016-07-25 ENCOUNTER — Telehealth: Payer: Self-pay

## 2016-07-25 NOTE — Telephone Encounter (Signed)
pt called left message on voice mail box that she is will not have enough medication to get her to her next appt.

## 2016-07-25 NOTE — Telephone Encounter (Signed)
left message on doctor line the ok to refill the buspar, lexapro, and seroquel with no additional refills.

## 2016-08-06 ENCOUNTER — Other Ambulatory Visit: Payer: Self-pay | Admitting: Family Medicine

## 2016-08-06 DIAGNOSIS — E039 Hypothyroidism, unspecified: Secondary | ICD-10-CM

## 2016-08-19 ENCOUNTER — Other Ambulatory Visit: Payer: Self-pay | Admitting: Psychiatry

## 2016-08-20 ENCOUNTER — Ambulatory Visit: Payer: Self-pay | Admitting: Psychiatry

## 2016-08-20 ENCOUNTER — Other Ambulatory Visit: Payer: Self-pay | Admitting: Psychiatry

## 2016-08-21 NOTE — Telephone Encounter (Signed)
Did not discuss med refills

## 2016-08-27 ENCOUNTER — Ambulatory Visit (INDEPENDENT_AMBULATORY_CARE_PROVIDER_SITE_OTHER): Payer: Medicare HMO | Admitting: Psychiatry

## 2016-08-27 ENCOUNTER — Encounter: Payer: Self-pay | Admitting: Psychiatry

## 2016-08-27 VITALS — BP 111/76 | HR 71 | Temp 98.4°F | Wt 142.0 lb

## 2016-08-27 DIAGNOSIS — F3170 Bipolar disorder, currently in remission, most recent episode unspecified: Secondary | ICD-10-CM | POA: Diagnosis not present

## 2016-08-27 MED ORDER — QUETIAPINE FUMARATE 25 MG PO TABS: 25.0000 mg | ORAL_TABLET | Freq: Every day | ORAL | 2 refills | Status: DC

## 2016-08-27 MED ORDER — BUSPIRONE HCL 10 MG PO TABS: 10.0000 mg | ORAL_TABLET | Freq: Two times a day (BID) | ORAL | 1 refills | Status: DC

## 2016-08-27 MED ORDER — ESCITALOPRAM OXALATE 10 MG PO TABS: ORAL_TABLET | ORAL | 1 refills | Status: DC

## 2016-08-27 NOTE — Progress Notes (Signed)
BH MD/PA/NP OP Progress Note  08/27/2016 3:11 PM Anna Giles  MRN:  [id number]  Subjective:  Patient is a 56 yo female with history of bipolar and anxiety presented for follow-up. Patient reports that she has been doing okay. Tolerating her medications well and reports that the BuSpar has been helpful. States that she is having some issues with her husband. She would like to start seeing a therapist. Denies any manic symptoms. Fair sleep and appetite. Denies any suicidal thoughts.   Chief Complaint:  Chief Complaint    Follow-up; Medication Refill     Visit Diagnosis:     ICD-9-CM ICD-10-CM   1. Bipolar disorder in full remission, most recent episode unspecified type ([place]) 296.80 F31.70     Past Medical History:  Past Medical History:  Diagnosis Date  . Abnormal exam/test finding   . Anxiety   . Bilateral shoulder pain   . Bipolar disorder ([place])   . Bipolar II disorder ([place])   . BP (high blood pressure)   . Chronic neck pain   . Chronic pain of both shoulders   . Encounter to establish care   . Fatty liver disease, nonalcoholic   . Gastritis   . Hypothyroidism   . Hypothyroidism, adult   . Screening for depression   . Transaminitis     Past Surgical History:  Procedure Laterality Date  . bladder sugery    . NECK SURGERY     Family History:  Family History  Problem Relation Age of Onset  . Depression Mother   . Ulcers Sister   . Breast cancer Paternal Grandmother    Social History:  Social History   Social History  . Marital status: Married    Spouse name: N/A  . Number of children: N/A  . Years of education: N/A   Social History Main Topics  . Smoking status: Current Every Day Smoker    Packs/day: 0.50    Types: Cigarettes    Start date: 12/20/1994  . Smokeless tobacco: Never Used  . Alcohol use No  . Drug use: No  . Sexual activity: No   Other Topics Concern  . None   Social History Narrative  . None   Additional History:   Assessment:    Musculoskeletal: Strength & Muscle Tone: within normal limits Gait & Station: normal Patient leans: N/A  Psychiatric Specialty Exam: Medication Refill     Review of Systems  Psychiatric/Behavioral: Negative for depression, hallucinations, memory loss, substance abuse and suicidal ideas. The patient is nervous/anxious (About leaving her house as discussed above). The patient does not have insomnia.   All other systems reviewed and are negative.   Blood pressure 111/76, pulse 71, temperature 98.4 F (36.9 C), temperature source Oral, weight 142 lb (64.4 kg).Body mass index is 25.15 kg/m.  General Appearance: Neat and Well Groomed  Eye Contact:  Good  Speech:  Normal Rate  Volume:  Normal  Mood:  Good  Affect:  bright  Thought Process:  Linear and Logical  Orientation:  Full (Time, Place, and Person)  Thought Content:  Negative  Suicidal Thoughts:  No  Homicidal Thoughts:  No  Memory:  Immediate;   Good Recent;   Good Remote;   Good  Judgement:  Good  Insight:  Good  Psychomotor Activity:  Negative  Concentration:  Good  Recall:  Good  Fund of Knowledge: Good  Language: Good  Akathisia:  Negative  Handed:  Right  AIMS (if indicated):    Assets:  Communication Skills Desire for Improvement Social Support  ADL's:  Intact  Cognition: WNL  Sleep:  good   Is the patient at risk to self?  No. Has the patient been a risk to self in the past 6 months?  No. Has the patient been a risk to self within the distant past?  No. Is the patient a risk to others?  No. Has the patient been a risk to others in the past 6 months?  No. Has the patient been a risk to others within the distant past?  No.  Current Medications: Current Outpatient Prescriptions  Medication Sig Dispense Refill  . busPIRone (BUSPAR) 10 MG tablet TAKE ONE TABLET BY MOUTH EVERY MORNING 30 tablet 0  . Cholecalciferol 400 UNITS CAPS Take 1 capsule by mouth 2 (two) times daily.    Marland Kitchen escitalopram (LEXAPRO)  10 MG tablet TAKE 1.5 TABLETS BY MOUTH EVERY MORNING 45 tablet 0  . levothyroxine (SYNTHROID, LEVOTHROID) 88 MCG tablet Take 1 tablet (88 mcg total) by mouth daily before breakfast. 90 tablet 1  . morphine (MS CONTIN) 60 MG 12 hr tablet Take 1 tablet by mouth 3 (three) times daily.    Marland Kitchen oxyCODONE-acetaminophen (PERCOCET/ROXICET) 5-325 MG per tablet Take 1 tablet by mouth as needed.    . pantoprazole (PROTONIX) 40 MG tablet Take 1 tablet by mouth daily.    . QUEtiapine (SEROQUEL) 25 MG tablet Take 1 tablet (25 mg total) by mouth at bedtime. 30 tablet 2  . tiZANidine (ZANAFLEX) 4 MG tablet Take 1 tablet by mouth 3 (three) times daily.    Marland Kitchen VITAMIN D, CHOLECALCIFEROL, PO Take by mouth.     No current facility-administered medications for this visit.     Medical Decision Making:  Established Problem, Stable/Improving (1) and Review of Medication Regimen & Side Effects (2)  Treatment Plan Summary:Medication management and Plan   Continue Lexapro 15 mg daily  Increase BuSpar 10 mg twice daily Will continue on Seroquel 25 mg by mouth daily at bedtime  Start therapy to address her stressors.   Encourage to continue healthy diet and exercise. RTC in 2 months or call before if necessary.    More than 50% of the time spent in psychoeducation, counseling and coordination of care.    This note was generated in part or whole with voice recognition software. Voice regonition is usually quite accurate but there are transcription errors that can and very often do occur. I apologize for any typographical errors that were not detected and corrected.    Coti Burd 08/27/2016, 3:11 PM

## 2016-08-29 ENCOUNTER — Other Ambulatory Visit: Payer: Self-pay | Admitting: Family Medicine

## 2016-08-29 DIAGNOSIS — E039 Hypothyroidism, unspecified: Secondary | ICD-10-CM

## 2016-09-03 ENCOUNTER — Ambulatory Visit (INDEPENDENT_AMBULATORY_CARE_PROVIDER_SITE_OTHER): Payer: Self-pay | Admitting: Family Medicine

## 2016-09-03 ENCOUNTER — Telehealth: Payer: Self-pay | Admitting: Family Medicine

## 2016-09-03 ENCOUNTER — Encounter: Payer: Self-pay | Admitting: Family Medicine

## 2016-09-03 VITALS — BP 110/74 | HR 75 | Temp 98.1°F | Resp 16 | Ht 63.0 in | Wt 143.2 lb

## 2016-09-03 DIAGNOSIS — E039 Hypothyroidism, unspecified: Secondary | ICD-10-CM | POA: Diagnosis not present

## 2016-09-03 DIAGNOSIS — R350 Frequency of micturition: Secondary | ICD-10-CM

## 2016-09-03 DIAGNOSIS — K2950 Unspecified chronic gastritis without bleeding: Secondary | ICD-10-CM

## 2016-09-03 LAB — POCT URINALYSIS DIPSTICK
BILIRUBIN UA: NEGATIVE
Blood, UA: NEGATIVE
GLUCOSE UA: NEGATIVE
Ketones, UA: NEGATIVE
Leukocytes, UA: NEGATIVE
NITRITE UA: NEGATIVE
Spec Grav, UA: 1.01 (ref 1.010–1.025)
Urobilinogen, UA: 0.2 E.U./dL
pH, UA: 5 (ref 5.0–8.0)

## 2016-09-03 LAB — TSH: TSH: 2 m[IU]/L

## 2016-09-03 LAB — T4, FREE: FREE T4: 1.2 ng/dL (ref 0.8–1.8)

## 2016-09-03 MED ORDER — PANTOPRAZOLE SODIUM 40 MG PO TBEC: 40.0000 mg | DELAYED_RELEASE_TABLET | Freq: Every day | ORAL | 1 refills | Status: DC

## 2016-09-03 MED ORDER — LEVOTHYROXINE SODIUM 88 MCG PO TABS: 88.0000 ug | ORAL_TABLET | Freq: Every day | ORAL | 1 refills | Status: DC

## 2016-09-03 NOTE — Progress Notes (Signed)
Name: Anna Giles   MRN: [id number]    DOB: 1960-08-23   Date:09/03/2016       Progress Note  Subjective  Chief Complaint  Chief Complaint  Patient presents with  . Medication Refill  . Urinary Frequency    Thyroid Problem  Presents for follow-up visit. Symptoms include hair loss (hair seem to be thinner). Patient reports no cold intolerance, constipation, depressed mood, fatigue, palpitations or weight gain.  Gastroesophageal Reflux  She complains of nausea and water brash. She reports no abdominal pain, no belching, no chest pain or no heartburn. This is a chronic problem. Pertinent negatives include no fatigue. She has tried a PPI (being followed by GI) for the symptoms.    Urinary Frequency:  Patient presents with concerns about increased urinary frequency, going on for about a year, nocturia 3-4x/ night, urinary frequency worse with drinking coffee, worse during certain time frame within a month (coinciding with the time frame when she used to have her periods), she has some urine leakage with coughing, no blood in urine, burning on urination, or urgency. 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     Past Medical History:  Diagnosis Date  . Abnormal exam/test finding   . Anxiety   . Bilateral shoulder pain   . Bipolar disorder ([place])   . Bipolar II disorder ([place])   . BP (high blood pressure)   . Chronic neck pain   . Chronic pain of both shoulders   . Encounter to establish care   . Fatty liver disease, nonalcoholic   . Gastritis   . Hypothyroidism   . Hypothyroidism, adult   . Screening for depression   . Transaminitis     Past Surgical History:  Procedure Laterality Date  . bladder sugery    . NECK SURGERY      Family History  Problem Relation Age of Onset  . Depression Mother   . Ulcers Sister   . Breast cancer Paternal Grandmother     Social History   Social History  . Marital  status: Married    Spouse name: N/A  . Number of children: N/A  . Years of education: N/A   Occupational History  . Not on file.   Social History Main Topics  . Smoking status: Current Every Day Smoker    Packs/day: 0.50    Types: Cigarettes    Start date: 12/20/1994  . Smokeless tobacco: Never Used  . Alcohol use No  . Drug use: No  . Sexual activity: No   Other Topics Concern  . Not on file   Social History Narrative  . No narrative on file     Current Outpatient Prescriptions:  .  busPIRone (BUSPAR) 10 MG tablet, Take 1 tablet (10 mg total) by mouth 2 (two) times daily., Disp: 60 tablet, Rfl: 1 .  Cholecalciferol 400 UNITS CAPS, Take 1 capsule by mouth 2 (two) times daily., Disp: , Rfl:  .  escitalopram (LEXAPRO) 10 MG tablet, TAKE 1.5 TABLETS BY MOUTH EVERY MORNING, Disp: 45 tablet, Rfl: 1 .  levothyroxine (SYNTHROID, LEVOTHROID) 88 MCG tablet, TAKE ONE TABLET BY MOUTH EVERY MORNING BEFORE BREAKFAST, Disp: 90 tablet, Rfl: 0 .  morphine (MS CONTIN) 60 MG 12 hr tablet, Take 1 tablet by mouth 3 (three) times daily., Disp: , Rfl:  .  oxyCODONE-acetaminophen (PERCOCET/ROXICET) 5-325 MG per tablet, Take 1 tablet by mouth as needed., Disp: , Rfl:  .  pantoprazole (PROTONIX) 40 MG tablet, Take 1 tablet by mouth daily., Disp: , Rfl:  .  QUEtiapine (SEROQUEL) 25 MG tablet, Take 1 tablet (25 mg total) by mouth at bedtime., Disp: 30 tablet, Rfl: 2 .  tiZANidine (ZANAFLEX) 4 MG tablet, Take 1 tablet by mouth 3 (three) times daily., Disp: , Rfl:  .  VITAMIN D, CHOLECALCIFEROL, PO, Take by mouth., Disp: , Rfl:   No Known Allergies   Review of Systems  Constitutional: Negative for fatigue and weight gain.  Cardiovascular: Negative for chest pain and palpitations.  Gastrointestinal: Positive for nausea. Negative for abdominal pain, constipation and heartburn.  Endo/Heme/Allergies: Negative for cold intolerance.      Objective  Vitals:   09/03/16 0959  BP: 110/74  Pulse: 75   Resp: 16  Temp: 98.1 F (36.7 C)  TempSrc: Oral  SpO2: 96%  Weight: 143 lb 3.2 oz (65 kg)  Height: 5\' 3"  (1.6 m)    Physical Exam  Constitutional: She is oriented to person, place, and time and well-developed, well-nourished, and in no distress.  Neck: No thyroid mass and no thyromegaly present.  Cardiovascular: Normal rate, regular rhythm and normal heart sounds.   No murmur heard. Pulmonary/Chest: Effort normal and breath sounds normal. She has no wheezes.  Abdominal: Soft. Bowel sounds are normal. There is no tenderness.  Neurological: She is alert and oriented to person, place, and time.  Psychiatric: Mood, memory, affect and judgment normal.  Nursing note and vitals reviewed.     Recent Results (from the past 2160 hour(s))  POCT Urinalysis Dipstick     Status: Normal   Collection Time: 09/03/16 10:04 AM  Result Value Ref Range   Color, UA     Clarity, UA     Glucose, UA neg    Bilirubin, UA neg    Ketones, UA neg    Spec Grav, UA 1.010 1.010 - 1.025   Blood, UA neg    pH, UA 5.0 5.0 - 8.0   Protein, UA Trace    Urobilinogen, UA 0.2 0.2 or 1.0 E.U./dL   Nitrite, UA neg    Leukocytes, UA Negative Negative     Assessment & Plan  1. Frequent urination Broad differential, referral to urology for management - POCT Urinalysis Dipstick - Ambulatory referral to Urology  2. Hypothyroidism, unspecified type Continue on levothyroxine, obtain TSH and free T4 levels - levothyroxine (SYNTHROID, LEVOTHROID) 88 MCG tablet; Take 1 tablet (88 mcg total) by mouth daily with breakfast.  Dispense: 90 tablet; Refill: 1 - TSH - T4, free  3. Chronic gastritis without bleeding, unspecified gastritis type Reviewed gastroenterology notes, will take over prescription of pantoprazole, refills provided - pantoprazole (PROTONIX) 40 MG tablet; Take 1 tablet (40 mg total) by mouth daily.  Dispense: 90 tablet; Refill: 1   Carmin Alvidrez [facility] 09/03/2016 10:15 AM

## 2016-09-03 NOTE — Telephone Encounter (Signed)
Pt goes to [facility] Nicholaus Bloom, MD) and states her insurance is not covering her bills there. She has been seeing him for years. The insurance company Personnel officer) told her that she need to get her PCP to send over a referral so that they will cover her visits.

## 2016-09-10 NOTE — Telephone Encounter (Signed)
Effective Jan. 1, 2018, [employer] no longer will require referrals for patient with [facility] Advantage health maintenance organization coverage and whose primary care physicians are part of the [facility].  If this in regards to visit within 2017, [employer] does not cover past visits.

## 2016-09-10 NOTE — Telephone Encounter (Signed)
Information given to [facility] for referral to be sent to [facility]

## 2016-09-11 NOTE — Telephone Encounter (Signed)
Left message for patient to call back. See what her DOS was and explain new guidelines to [facility]

## 2016-09-12 ENCOUNTER — Other Ambulatory Visit: Payer: Self-pay | Admitting: Psychiatry

## 2016-09-17 ENCOUNTER — Ambulatory Visit (INDEPENDENT_AMBULATORY_CARE_PROVIDER_SITE_OTHER): Payer: Medicare HMO | Admitting: Licensed Clinical Social Worker

## 2016-09-17 DIAGNOSIS — F3170 Bipolar disorder, currently in remission, most recent episode unspecified: Secondary | ICD-10-CM | POA: Diagnosis not present
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
## 2016-09-24 DIAGNOSIS — M961 Postlaminectomy syndrome, not elsewhere classified: Secondary | ICD-10-CM | POA: Diagnosis not present

## 2016-09-24 DIAGNOSIS — G894 Chronic pain syndrome: Secondary | ICD-10-CM | POA: Diagnosis not present

## 2016-09-24 DIAGNOSIS — M4722 Other spondylosis with radiculopathy, cervical region: Secondary | ICD-10-CM | POA: Diagnosis not present

## 2016-09-24 DIAGNOSIS — Z79891 Long term (current) use of opiate analgesic: Secondary | ICD-10-CM | POA: Diagnosis not present

## 2016-10-01 ENCOUNTER — Ambulatory Visit: Payer: Self-pay | Admitting: Urology

## 2016-10-18 ENCOUNTER — Encounter: Payer: Self-pay | Admitting: Family Medicine

## 2016-10-22 ENCOUNTER — Ambulatory Visit: Payer: Medicare HMO | Admitting: Licensed Clinical Social Worker

## 2016-10-22 ENCOUNTER — Ambulatory Visit: Payer: Medicare HMO | Admitting: Psychiatry

## 2016-10-22 ENCOUNTER — Ambulatory Visit: Payer: Self-pay | Admitting: Psychiatry

## 2016-10-29 ENCOUNTER — Other Ambulatory Visit: Payer: Self-pay | Admitting: Psychiatry

## 2016-11-02 ENCOUNTER — Other Ambulatory Visit: Payer: Self-pay | Admitting: Psychiatry

## 2016-11-05 ENCOUNTER — Ambulatory Visit: Payer: Medicare HMO | Admitting: Psychiatry

## 2016-11-05 ENCOUNTER — Ambulatory Visit: Payer: Medicare HMO | Admitting: Licensed Clinical Social Worker

## 2016-11-19 ENCOUNTER — Ambulatory Visit: Payer: Medicare HMO | Admitting: Psychiatry

## 2016-11-19 ENCOUNTER — Ambulatory Visit: Payer: Medicare HMO | Admitting: Licensed Clinical Social Worker

## 2016-11-19 DIAGNOSIS — G894 Chronic pain syndrome: Secondary | ICD-10-CM | POA: Diagnosis not present

## 2016-11-19 DIAGNOSIS — M4722 Other spondylosis with radiculopathy, cervical region: Secondary | ICD-10-CM | POA: Diagnosis not present

## 2016-11-19 DIAGNOSIS — F419 Anxiety disorder, unspecified: Secondary | ICD-10-CM | POA: Diagnosis not present

## 2016-11-19 DIAGNOSIS — Z79891 Long term (current) use of opiate analgesic: Secondary | ICD-10-CM | POA: Diagnosis not present

## 2016-11-19 DIAGNOSIS — M961 Postlaminectomy syndrome, not elsewhere classified: Secondary | ICD-10-CM | POA: Diagnosis not present

## 2016-11-26 ENCOUNTER — Encounter: Payer: Self-pay | Admitting: Family Medicine

## 2017-01-14 DIAGNOSIS — Z79891 Long term (current) use of opiate analgesic: Secondary | ICD-10-CM | POA: Diagnosis not present

## 2017-01-14 DIAGNOSIS — M4722 Other spondylosis with radiculopathy, cervical region: Secondary | ICD-10-CM | POA: Diagnosis not present

## 2017-01-14 DIAGNOSIS — G894 Chronic pain syndrome: Secondary | ICD-10-CM | POA: Diagnosis not present

## 2017-01-14 DIAGNOSIS — M961 Postlaminectomy syndrome, not elsewhere classified: Secondary | ICD-10-CM | POA: Diagnosis not present

## 2017-02-19 ENCOUNTER — Other Ambulatory Visit: Payer: Self-pay | Admitting: Psychiatry

## 2017-02-27 ENCOUNTER — Other Ambulatory Visit: Payer: Self-pay | Admitting: Family Medicine

## 2017-02-27 DIAGNOSIS — K2950 Unspecified chronic gastritis without bleeding: Secondary | ICD-10-CM

## 2017-03-25 DIAGNOSIS — Z79891 Long term (current) use of opiate analgesic: Secondary | ICD-10-CM | POA: Diagnosis not present

## 2017-03-25 DIAGNOSIS — M961 Postlaminectomy syndrome, not elsewhere classified: Secondary | ICD-10-CM | POA: Diagnosis not present

## 2017-03-25 DIAGNOSIS — M4722 Other spondylosis with radiculopathy, cervical region: Secondary | ICD-10-CM | POA: Diagnosis not present

## 2017-03-25 DIAGNOSIS — G894 Chronic pain syndrome: Secondary | ICD-10-CM | POA: Diagnosis not present

## 2017-04-07 ENCOUNTER — Telehealth: Payer: Self-pay | Admitting: Family Medicine

## 2017-04-08 ENCOUNTER — Other Ambulatory Visit: Payer: Self-pay

## 2017-04-08 DIAGNOSIS — K2950 Unspecified chronic gastritis without bleeding: Secondary | ICD-10-CM

## 2017-04-08 MED ORDER — PANTOPRAZOLE SODIUM 40 MG PO TBEC: 40.0000 mg | DELAYED_RELEASE_TABLET | Freq: Every day | ORAL | 1 refills | Status: DC

## 2017-04-08 NOTE — Telephone Encounter (Signed)
Copied from [place] [id number]. Topic: Quick Communication - See Telephone Encounter >> Apr 07, 2017  4:00 PM Luana Shu, Amy P wrote: CRM for notification. See Telephone encounter for:   04/07/17.  Patient called and she has only one more protonix pill for tomorrow and needs refill ordered tomorrow.

## 2017-04-15 ENCOUNTER — Ambulatory Visit: Payer: Self-pay

## 2017-05-29 ENCOUNTER — Other Ambulatory Visit: Payer: Self-pay | Admitting: Family Medicine

## 2017-05-29 DIAGNOSIS — E039 Hypothyroidism, unspecified: Secondary | ICD-10-CM

## 2017-06-10 NOTE — Telephone Encounter (Signed)
Will continue to try to reach

## 2017-06-17 ENCOUNTER — Telehealth: Payer: Self-pay | Admitting: Family Medicine

## 2017-06-17 NOTE — Telephone Encounter (Signed)
Received rx request for Levothroxine 57mcg tablet from [facility].  LVM for patient to schedule an appointment to see Dr. Ancil Boozer because see had not been seen since May 2018 and Dr. Manuella Ghazi has left the practice.

## 2017-06-18 ENCOUNTER — Telehealth: Payer: Self-pay

## 2017-06-18 DIAGNOSIS — E039 Hypothyroidism, unspecified: Secondary | ICD-10-CM

## 2017-06-18 MED ORDER — LEVOTHYROXINE SODIUM 88 MCG PO TABS: 88.0000 ug | ORAL_TABLET | Freq: Every day | ORAL | 0 refills | Status: DC

## 2017-06-18 NOTE — Telephone Encounter (Signed)
Refill request for thyroid medication: Levothyroxine 88 mcg  Last Physical: None indicated   Lab Results  Component Value Date   TSH 2.00 09/03/2016    Follow-up on file. None indicated

## 2017-06-18 NOTE — Telephone Encounter (Signed)
She needs follow up, I will send one week supply

## 2017-06-18 NOTE — Telephone Encounter (Signed)
Pt called in to schedule follow up - she is scheduled for 07/22/17 - pt needs a Tuesday or Friday appt if she needs to come in sooner - requesting enough meds to get to her appt date.

## 2017-06-18 NOTE — Telephone Encounter (Signed)
Left message on: [phone] @ 2:19pm. Dr Ancil Boozer only gave pt a 7 day supply and is asking that pt come in to be seen. Dr Ancil Boozer is willing to see the patient tomorrow 06/19/17 @ 12pm

## 2017-06-19 ENCOUNTER — Other Ambulatory Visit: Payer: Self-pay | Admitting: Family Medicine

## 2017-06-19 DIAGNOSIS — E039 Hypothyroidism, unspecified: Secondary | ICD-10-CM

## 2017-06-19 MED ORDER — LEVOTHYROXINE SODIUM 88 MCG PO TABS: 88.0000 ug | ORAL_TABLET | Freq: Every day | ORAL | 0 refills | Status: DC

## 2017-07-22 ENCOUNTER — Telehealth: Payer: Self-pay

## 2017-07-22 ENCOUNTER — Encounter: Payer: Self-pay | Admitting: Family Medicine

## 2017-07-22 ENCOUNTER — Ambulatory Visit: Payer: Medicare HMO | Admitting: Family Medicine

## 2017-07-22 VITALS — BP 112/80 | HR 79 | Resp 14 | Ht 63.0 in | Wt 144.3 lb

## 2017-07-22 DIAGNOSIS — F3131 Bipolar disorder, current episode depressed, mild: Secondary | ICD-10-CM

## 2017-07-22 DIAGNOSIS — Z79899 Other long term (current) drug therapy: Secondary | ICD-10-CM

## 2017-07-22 DIAGNOSIS — T402X5A Adverse effect of other opioids, initial encounter: Secondary | ICD-10-CM

## 2017-07-22 DIAGNOSIS — Z1231 Encounter for screening mammogram for malignant neoplasm of breast: Secondary | ICD-10-CM

## 2017-07-22 DIAGNOSIS — K5903 Drug induced constipation: Secondary | ICD-10-CM

## 2017-07-22 DIAGNOSIS — M542 Cervicalgia: Secondary | ICD-10-CM

## 2017-07-22 DIAGNOSIS — E039 Hypothyroidism, unspecified: Secondary | ICD-10-CM

## 2017-07-22 DIAGNOSIS — Z1159 Encounter for screening for other viral diseases: Secondary | ICD-10-CM

## 2017-07-22 DIAGNOSIS — Z114 Encounter for screening for human immunodeficiency virus [HIV]: Secondary | ICD-10-CM

## 2017-07-22 DIAGNOSIS — K2950 Unspecified chronic gastritis without bleeding: Secondary | ICD-10-CM | POA: Diagnosis not present

## 2017-07-22 DIAGNOSIS — Z1239 Encounter for other screening for malignant neoplasm of breast: Secondary | ICD-10-CM

## 2017-07-22 DIAGNOSIS — G8929 Other chronic pain: Secondary | ICD-10-CM

## 2017-07-22 DIAGNOSIS — Z981 Arthrodesis status: Secondary | ICD-10-CM | POA: Diagnosis not present

## 2017-07-22 DIAGNOSIS — Z23 Encounter for immunization: Secondary | ICD-10-CM

## 2017-07-22 MED ORDER — LUBIPROSTONE 24 MCG PO CAPS: 24.0000 ug | ORAL_CAPSULE | Freq: Two times a day (BID) | ORAL | 0 refills | Status: DC

## 2017-07-22 MED ORDER — PANTOPRAZOLE SODIUM 40 MG PO TBEC: 40.0000 mg | DELAYED_RELEASE_TABLET | Freq: Every day | ORAL | 3 refills | Status: DC

## 2017-07-22 NOTE — Progress Notes (Signed)
Name: Anna Giles   MRN: [id number]    DOB: 09/19/1960   Date:07/22/2017       Progress Note  Subjective  Chief Complaint  Chief Complaint  Patient presents with  . Medication Refill  . Back Pain    Chronic    HPI   Hypothyroidism  Patient prescribed synthroid 20mcg daily, weight is stable, no change in bowel movements  Bipolar Disorder  Patient prescribed lexapro, buspar  and seroquel. Patient follows up with Counselor- Royal Piedra last seen in May 2018 and Dr. Einar Grad- psychiatry. She states going every 3 months and takes medications as prescribed.   GERD  Patient prescribed protonix 40mg  daily followed up with GI MD- Dr Tiffany Kocher, she has a long history of gastritis, states recently had nausea and vomiting and scheduled follow up with Dr. Vira Agar, feeling well now. No heartburn, no indigestion  Chronic Pain   Patient prescribed morphine 60mg  q 12 hour and percocet 5-325mg , pain triggered by workman's comp , neck injury. She still has daily pain 6-7/10. Worse with movements. Decrease in rom of neck. S/p fusion. Constipation is side effects of medication, takes Miralax prn. Needs referral to be placed to continue to get insurance to cover the cost.    Patient Active Problem List   Diagnosis Date Noted  . Skin lesion of back 01/31/2015  . Alveolar aeration decreased 08/30/2014  . Anxiety 08/30/2014  . Bipolar 2 disorder ([place]) 08/30/2014  . Chronic cervical pain 08/30/2014  . Pain in shoulder 08/30/2014  . Blood pressure elevated 08/30/2014  . Fatty liver disease, nonalcoholic 17/61/6073  . Gastritis 08/30/2014  . Adult hypothyroidism 08/30/2014  . Elevation of level of transaminase or lactic acid dehydrogenase (LDH) 08/30/2014    Past Surgical History:  Procedure Laterality Date  . bladder sugery    . NECK SURGERY      Family History  Problem Relation Age of Onset  . Depression Mother   . Ulcers Sister   . Breast cancer Paternal Grandmother     Social  History   Socioeconomic History  . Marital status: Married    Spouse name: Not on file  . Number of children: Not on file  . Years of education: Not on file  . Highest education level: Not on file  Occupational History    Comment: chronic neck pain  2004  Social Needs  . Financial resource strain: Not on file  . Food insecurity:    Worry: Not on file    Inability: Not on file  . Transportation needs:    Medical: Not on file    Non-medical: Not on file  Tobacco Use  . Smoking status: Current Every Day Smoker    Packs/day: 0.50    Years: 23.00    Pack years: 11.50    Types: Cigarettes    Start date: 12/20/1994  . Smokeless tobacco: Never Used  Substance and Sexual Activity  . Alcohol use: Not Currently    Alcohol/week: 0.0 oz    Comment: used to drink heavily   . Drug use: No  . Sexual activity: Not Currently  Lifestyle  . Physical activity:    Days per week: Not on file    Minutes per session: Not on file  . Stress: Not on file  Relationships  . Social connections:    Talks on phone: Not on file    Gets together: Not on file    Attends religious service: Not on file    Active member of  club or organization: Not on file    Attends meetings of clubs or organizations: Not on file    Relationship status: Not on file  . Intimate partner violence:    Fear of current or ex partner: Not on file    Emotionally abused: Not on file    Physically abused: Not on file    Forced sexual activity: Not on file  Other Topics Concern  . Not on file  Social History Narrative   Married   Used to drink heavily but quit in 2006   Husband still drinks.      Current Outpatient Medications:  .  busPIRone (BUSPAR) 10 MG tablet, Take 1 tablet (10 mg total) by mouth 2 (two) times daily., Disp: 60 tablet, Rfl: 1 .  Cholecalciferol 400 UNITS CAPS, Take 1 capsule by mouth 2 (two) times daily., Disp: , Rfl:  .  escitalopram (LEXAPRO) 10 MG tablet, TAKE 1.5 TABLETS BY MOUTH EVERY MORNING,  Disp: 45 tablet, Rfl: 1 .  levothyroxine (SYNTHROID, LEVOTHROID) 88 MCG tablet, Take 1 tablet (88 mcg total) by mouth daily with breakfast., Disp: 30 tablet, Rfl: 0 .  morphine (MS CONTIN) 60 MG 12 hr tablet, Take 1 tablet by mouth 3 (three) times daily., Disp: , Rfl:  .  oxyCODONE-acetaminophen (PERCOCET/ROXICET) 5-325 MG per tablet, Take 1 tablet by mouth as needed., Disp: , Rfl:  .  pantoprazole (PROTONIX) 40 MG tablet, Take 1 tablet (40 mg total) by mouth daily., Disp: 90 tablet, Rfl: 3 .  QUEtiapine (SEROQUEL) 25 MG tablet, Take 1 tablet (25 mg total) by mouth at bedtime., Disp: 30 tablet, Rfl: 2 .  tiZANidine (ZANAFLEX) 4 MG tablet, Take 1 tablet by mouth 3 (three) times daily., Disp: , Rfl:   No Known Allergies  ROS  Constitutional: Negative for fever or weight change.  Respiratory: Negative for cough and shortness of breath.   Cardiovascular: Negative for chest pain or palpitations.  Gastrointestinal: Negative for abdominal pain, no bowel changes.  Musculoskeletal: Negative for gait problem or joint swelling.  Skin: Negative for rash.  Neurological: Negative for dizziness or headache.  No other specific complaints in a complete review of systems (except as listed in HPI above).  Objective  Vitals:   07/22/17 1338  BP: 112/80  Pulse: 79  Resp: 14  SpO2: 98%  Weight: 144 lb 4.8 oz (65.5 kg)  Height: 5\' 3"  (1.6 m)     Body mass index is 25.56 kg/m.  Physical Exam  Constitutional: Patient appears well-developed and well-nourished. No distress.  HEENT: head atraumatic, normocephalic, pupils equal and reactive to light, throat within normal limits Cardiovascular: Normal rate, regular rhythm and normal heart sounds.  No murmur heard. No BLE edema. Pulmonary/Chest: Effort normal and breath sounds normal. No respiratory distress. Abdominal: Soft.  There is no tenderness. Psychiatric: Patient has a normal mood and affect. behavior is normal. Judgment and thought content  normal. Muscular skeletal: neck has decrease rom, pain with palpation , weaker grip on right side, spasms on neck   PHQ2/9: Depression screen [facility] 2/9 09/03/2016 01/24/2016 10/04/2015 01/31/2015  Decreased Interest 0 0 0 0  Down, Depressed, Hopeless 0 0 0 1  PHQ - 2 Score 0 0 0 1     Fall Risk: Fall Risk  07/22/2017 09/03/2016 01/24/2016 10/04/2015 01/31/2015  Falls in the past year? No No No No No    Functional Status Survey: Is the patient deaf or have difficulty hearing?: No Does the patient have difficulty seeing, even  when wearing glasses/contacts?: No Does the patient have difficulty concentrating, remembering, or making decisions?: No Does the patient have difficulty walking or climbing stairs?: No Does the patient have difficulty dressing or bathing?: No Does the patient have difficulty doing errands alone such as visiting a doctor's office or shopping?: No   Assessment & Plan  1. Chronic gastritis without bleeding, unspecified gastritis type  - pantoprazole (PROTONIX) 40 MG tablet; Take 1 tablet (40 mg total) by mouth daily.  Dispense: 90 tablet; Refill: 3  2. Bipolar affective disorder, currently depressed, mild ([place])  Continue follow up with Dr. Einar Grad  3. Flu vaccine need  Refused   4. Breast cancer screening  - MM Digital Screening  5. Hypothyroidism, unspecified type  - Lipid panel - TSH  6. Chronic neck pain  Referral pain clinic   7. History of fusion of cervical spine   8. Long-term use of high-risk medication  - COMPLETE METABOLIC PANEL WITH GFR - CBC with Differential/Platelet  9. Therapeutic opioid-induced constipation (OIC)  - lubiprostone (AMITIZA) 24 MCG capsule; Take 1 capsule (24 mcg total) by mouth 2 (two) times daily with a meal.  Dispense: 60 capsule; Refill: 0  10. Encounter for screening for HIV  - HIV antibody  11. Need for hepatitis C screening test  - Hepatitis C Antibody

## 2017-07-22 NOTE — Telephone Encounter (Signed)
Called pt to sched AWV w/ [facility]. LVM requesting returned call.  

## 2017-07-24 ENCOUNTER — Encounter: Payer: Self-pay | Admitting: Family Medicine

## 2017-07-24 DIAGNOSIS — E785 Hyperlipidemia, unspecified: Secondary | ICD-10-CM | POA: Insufficient documentation

## 2017-07-25 ENCOUNTER — Other Ambulatory Visit: Payer: Self-pay | Admitting: Family Medicine

## 2017-07-25 DIAGNOSIS — E039 Hypothyroidism, unspecified: Secondary | ICD-10-CM

## 2017-07-25 NOTE — Telephone Encounter (Signed)
Refill request for thyroid medication: Levothyroxine 88 mcg  Last Physical: 07/22/2017  Lab Results  Component Value Date   TSH 0.91 07/22/2017    Follow-ups on file. None indicated

## 2017-07-26 LAB — COMPLETE METABOLIC PANEL WITH GFR
AG RATIO: 1.3 (calc) (ref 1.0–2.5)
ALKALINE PHOSPHATASE (APISO): 89 U/L (ref 33–130)
ALT: 12 U/L (ref 6–29)
AST: 15 U/L (ref 10–35)
Albumin: 4.4 g/dL (ref 3.6–5.1)
BUN: 19 mg/dL (ref 7–25)
CHLORIDE: 105 mmol/L (ref 98–110)
CO2: 26 mmol/L (ref 20–32)
Calcium: 9.8 mg/dL (ref 8.6–10.4)
Creat: 0.94 mg/dL (ref 0.50–1.05)
GFR, Est African American: 79 mL/min/{1.73_m2} (ref >=60)
GFR, Est Non African American: 68 mL/min/{1.73_m2} (ref >=60)
GLUCOSE: 110 mg/dL (ref 65–139)
Globulin: 3.3 g/dL (calc) (ref 1.9–3.7)
POTASSIUM: 4.1 mmol/L (ref 3.5–5.3)
Sodium: 140 mmol/L (ref 135–146)
Total Bilirubin: 0.3 mg/dL (ref 0.2–1.2)
Total Protein: 7.7 g/dL (ref 6.1–8.1)

## 2017-07-26 LAB — HEPATITIS C ANTIBODY
HEP C AB: NONREACTIVE
SIGNAL TO CUT-OFF: 0.02 (ref <1.00)

## 2017-07-26 LAB — HIV ANTIBODY (ROUTINE TESTING W REFLEX): HIV 1&2 Ab, 4th Generation: NONREACTIVE

## 2017-07-26 LAB — TEST AUTHORIZATION

## 2017-07-26 LAB — CBC WITH DIFFERENTIAL/PLATELET
Basophils Absolute: 33 cells/uL (ref 0–200)
Basophils Relative: 0.4 %
EOS PCT: 1.8 %
Eosinophils Absolute: 148 cells/uL (ref 15–500)
HEMATOCRIT: 42.2 % (ref 35.0–45.0)
HEMOGLOBIN: 14.3 g/dL (ref 11.7–15.5)
LYMPHS ABS: 2919 {cells}/uL (ref 850–3900)
MCH: 29.2 pg (ref 27.0–33.0)
MCHC: 33.9 g/dL (ref 32.0–36.0)
MCV: 86.1 fL (ref 80.0–100.0)
MONOS PCT: 4.4 %
MPV: 12.6 fL — ABNORMAL HIGH (ref 7.5–12.5)
NEUTROS ABS: 4740 {cells}/uL (ref 1500–7800)
Neutrophils Relative %: 57.8 %
Platelets: 222 10*3/uL (ref 140–400)
RBC: 4.9 10*6/uL (ref 3.80–5.10)
RDW: 12.9 % (ref 11.0–15.0)
Total Lymphocyte: 35.6 %
WBC mixed population: 361 cells/uL (ref 200–950)
WBC: 8.2 10*3/uL (ref 3.8–10.8)

## 2017-07-26 LAB — TSH: TSH: 0.91 mIU/L (ref 0.40–4.50)

## 2017-07-26 LAB — LIPID PANEL
CHOL/HDL RATIO: 4.7 (calc) (ref <5.0)
Cholesterol: 216 mg/dL — ABNORMAL HIGH (ref <200)
HDL: 46 mg/dL — AB (ref >50)
LDL CHOLESTEROL (CALC): 115 mg/dL — AB
NON-HDL CHOLESTEROL (CALC): 170 mg/dL — AB (ref <130)
TRIGLYCERIDES: 383 mg/dL — AB (ref <150)

## 2017-07-26 LAB — HEMOGLOBIN A1C W/OUT EAG: Hgb A1c MFr Bld: 5.2 % of total Hgb (ref <5.7)

## 2018-01-20 ENCOUNTER — Other Ambulatory Visit: Payer: Self-pay | Admitting: Family Medicine

## 2018-01-20 DIAGNOSIS — E039 Hypothyroidism, unspecified: Secondary | ICD-10-CM

## 2018-01-20 NOTE — Telephone Encounter (Signed)
Left voice message on [phone] @ 11:18 informing pt to return call to schedule appt

## 2018-02-10 ENCOUNTER — Other Ambulatory Visit: Payer: Self-pay | Admitting: Family Medicine

## 2018-02-10 DIAGNOSIS — E039 Hypothyroidism, unspecified: Secondary | ICD-10-CM

## 2018-02-10 NOTE — Telephone Encounter (Unsigned)
Copied from [place]. Topic: Quick Communication - Rx Refill/Question >> Feb 10, 2018 12:37 PM Percell Belt A wrote: Medication: levothyroxine (SYNTHROID, LEVOTHROID) 88 MCG tablet [[id number]]  Has the patient contacted their pharmacy? No  (Agent: If no, request that the patient contact the pharmacy for the refill.) (Agent: If yes, when and what did the pharmacy advise?)  Preferred Pharmacy (with phone number or street name): [place], [place] [phone] (Phone)   Agent: Please be advised that RX refills may take up to 3 business days. We ask that you follow-up with your pharmacy.

## 2018-02-11 MED ORDER — LEVOTHYROXINE SODIUM 88 MCG PO TABS: ORAL_TABLET | ORAL | 0 refills | Status: DC

## 2018-02-11 NOTE — Telephone Encounter (Signed)
Pt states she has an appt on 03/24/18 and cannot come in before then

## 2018-02-11 NOTE — Telephone Encounter (Signed)
She needs an appointment for follow up. 

## 2018-03-10 ENCOUNTER — Other Ambulatory Visit: Payer: Self-pay | Admitting: Family Medicine

## 2018-03-10 DIAGNOSIS — E039 Hypothyroidism, unspecified: Secondary | ICD-10-CM

## 2018-03-24 ENCOUNTER — Ambulatory Visit (INDEPENDENT_AMBULATORY_CARE_PROVIDER_SITE_OTHER): Payer: Medicare Other | Admitting: Family Medicine

## 2018-03-24 ENCOUNTER — Encounter: Payer: Self-pay | Admitting: Family Medicine

## 2018-03-24 VITALS — BP 100/72 | HR 73 | Temp 97.3°F | Resp 16 | Ht 63.0 in | Wt 150.2 lb

## 2018-03-24 DIAGNOSIS — Z23 Encounter for immunization: Secondary | ICD-10-CM

## 2018-03-24 DIAGNOSIS — K2950 Unspecified chronic gastritis without bleeding: Secondary | ICD-10-CM

## 2018-03-24 DIAGNOSIS — M542 Cervicalgia: Secondary | ICD-10-CM | POA: Diagnosis not present

## 2018-03-24 DIAGNOSIS — Z981 Arthrodesis status: Secondary | ICD-10-CM | POA: Diagnosis not present

## 2018-03-24 DIAGNOSIS — E039 Hypothyroidism, unspecified: Secondary | ICD-10-CM

## 2018-03-24 DIAGNOSIS — K5903 Drug induced constipation: Secondary | ICD-10-CM

## 2018-03-24 DIAGNOSIS — F3131 Bipolar disorder, current episode depressed, mild: Secondary | ICD-10-CM | POA: Diagnosis not present

## 2018-03-24 DIAGNOSIS — G8929 Other chronic pain: Secondary | ICD-10-CM

## 2018-03-24 DIAGNOSIS — T402X5A Adverse effect of other opioids, initial encounter: Secondary | ICD-10-CM

## 2018-03-24 MED ORDER — LEVOTHYROXINE SODIUM 88 MCG PO TABS: ORAL_TABLET | ORAL | 1 refills | Status: DC

## 2018-03-24 MED ORDER — ESCITALOPRAM OXALATE 10 MG PO TABS: ORAL_TABLET | ORAL | 1 refills | Status: DC
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
## 2018-03-24 NOTE — Addendum Note (Signed)
Addended by: Inda Coke on: 03/24/2018 12:34 PM   Modules accepted: Orders

## 2018-04-14 DIAGNOSIS — Z79891 Long term (current) use of opiate analgesic: Secondary | ICD-10-CM | POA: Diagnosis not present

## 2018-04-14 DIAGNOSIS — G894 Chronic pain syndrome: Secondary | ICD-10-CM | POA: Diagnosis not present

## 2018-05-22 ENCOUNTER — Other Ambulatory Visit: Payer: Self-pay | Admitting: Family Medicine

## 2018-05-22 DIAGNOSIS — F3131 Bipolar disorder, current episode depressed, mild: Secondary | ICD-10-CM

## 2018-05-22 NOTE — Telephone Encounter (Signed)
Refill request for general medication: Lexapro 10 mg  Last office visit: 03/24/2018  Last physical exam: None indicated  No follow-ups on file. 07/28/2018

## 2018-06-16 DIAGNOSIS — Z79891 Long term (current) use of opiate analgesic: Secondary | ICD-10-CM | POA: Diagnosis not present

## 2018-06-16 DIAGNOSIS — G894 Chronic pain syndrome: Secondary | ICD-10-CM | POA: Diagnosis not present

## 2018-06-16 DIAGNOSIS — M961 Postlaminectomy syndrome, not elsewhere classified: Secondary | ICD-10-CM | POA: Diagnosis not present

## 2018-06-16 DIAGNOSIS — M4722 Other spondylosis with radiculopathy, cervical region: Secondary | ICD-10-CM | POA: Diagnosis not present

## 2018-06-26 ENCOUNTER — Other Ambulatory Visit (HOSPITAL_COMMUNITY): Payer: Self-pay | Admitting: Anesthesiology

## 2018-06-26 DIAGNOSIS — M542 Cervicalgia: Secondary | ICD-10-CM

## 2018-07-07 ENCOUNTER — Ambulatory Visit
Admission: RE | Admit: 2018-07-07 | Discharge: 2018-07-07 | Disposition: A | Discharge status: Home / Self Care | Payer: Medicare HMO | Source: Ambulatory Visit | Attending: Anesthesiology | Admitting: Anesthesiology

## 2018-07-07 ENCOUNTER — Other Ambulatory Visit: Payer: Self-pay

## 2018-07-07 DIAGNOSIS — M50123 Cervical disc disorder at C6-C7 level with radiculopathy: Secondary | ICD-10-CM | POA: Diagnosis not present

## 2018-07-07 DIAGNOSIS — M4802 Spinal stenosis, cervical region: Secondary | ICD-10-CM | POA: Diagnosis not present

## 2018-07-07 DIAGNOSIS — M542 Cervicalgia: Secondary | ICD-10-CM | POA: Diagnosis present

## 2018-07-07 IMAGING — MR MRI CERVICAL SPINE WITHOUT AND WITH CONTRAST
5 of 8 series · 29 of 48 positions shown · IV contrast (Gadavist)
Comparison: CT cervical spine [DATE].

CLINICAL DATA: Chronic neck pain radiating into both shoulders with
numbness and tingling in the arms and hands. Symptoms are chronic
and worse on the right. History of prior cervical surgery.

EXAM:
MRI CERVICAL SPINE WITHOUT AND WITH CONTRAST
TECHNIQUE: Multiplanar and multiecho pulse sequences of the cervical spine, to
include the craniocervical junction and cervicothoracic junction,
were obtained without and with intravenous contrast.
CONTRAST:  7 cc Gadavist IV.

[Series 5: T2 · sagittal · 3.0mm · 0.62mm/px · 4 of 15 slices shown (1 of 2)]
[im 1/15]
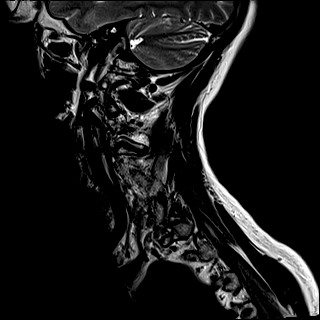
[im 5/15]
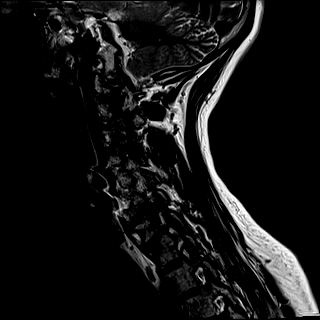
[im 10/15]
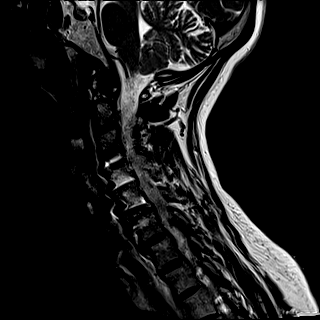
[im 15/15]
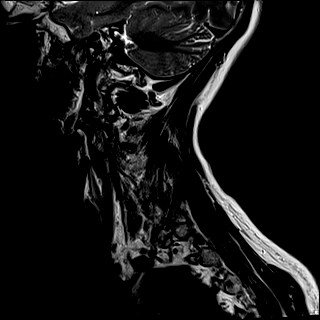

[Series 7: STIR · sagittal · 3.0mm · 0.62mm/px · 4 of 15 slices shown]
[im 1/15]
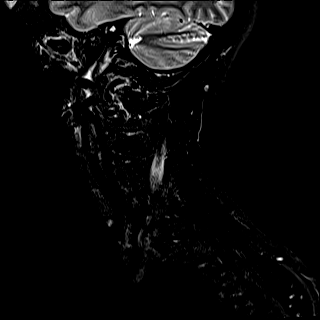
[im 5/15]
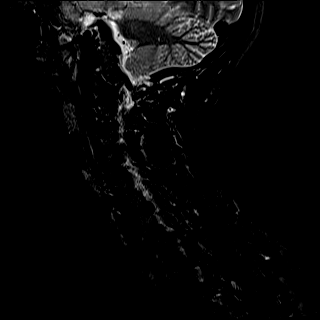
[im 10/15]
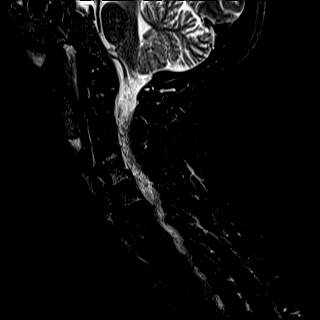
[im 15/15]
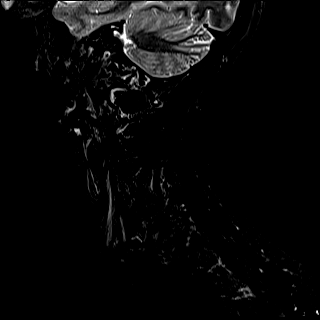

[Series 8: T2 · axial · 3.0mm · 0.70mm/px · z∈[-99,+2]mm · 8 of 31 slices shown (2 of 2)]
[im 1/31]
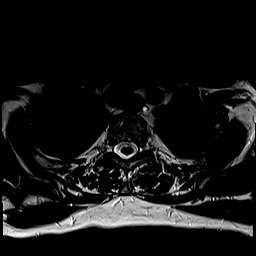
[im 5/31]
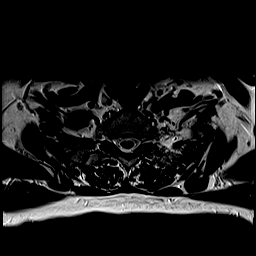
[im 9/31]
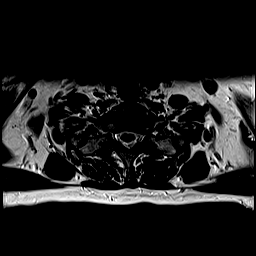
[im 13/31]
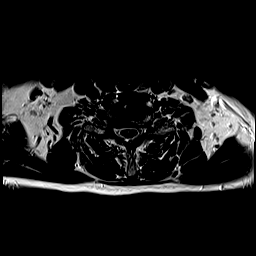
[im 18/31]
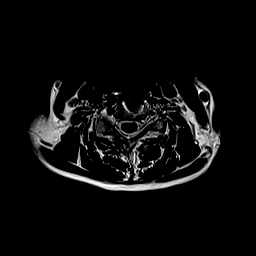
[im 22/31]
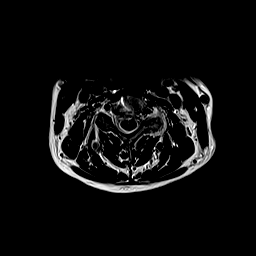
[im 26/31]
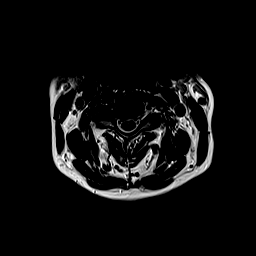
[im 31/31]
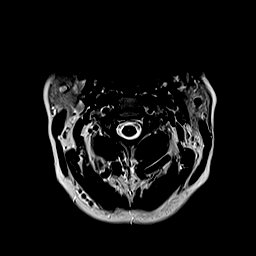

[Series 10: T1 · axial · non-contrast · 3.0mm · 0.35mm/px · z∈[-99,+2]mm · 8 of 31 slices shown]
[im 1/31]
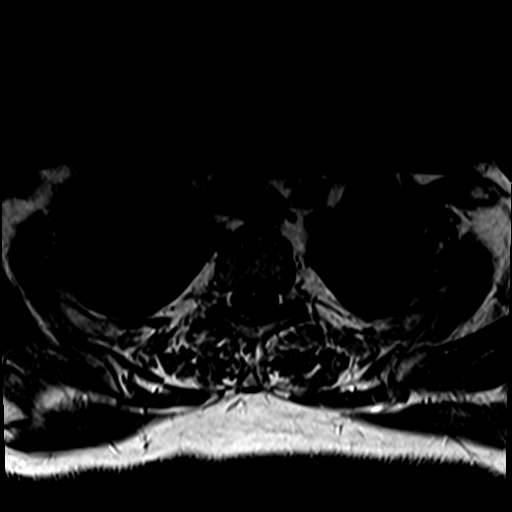
[im 5/31]
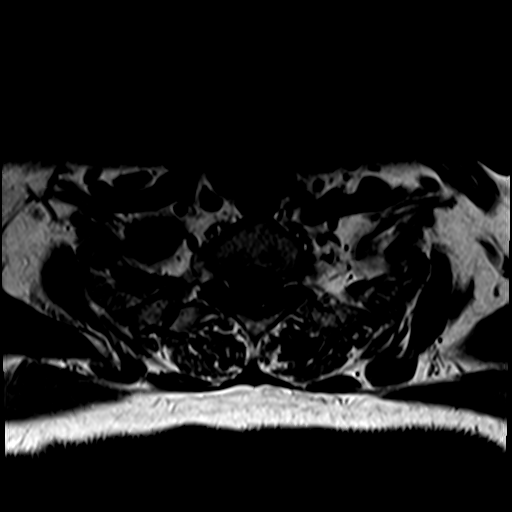
[im 9/31]
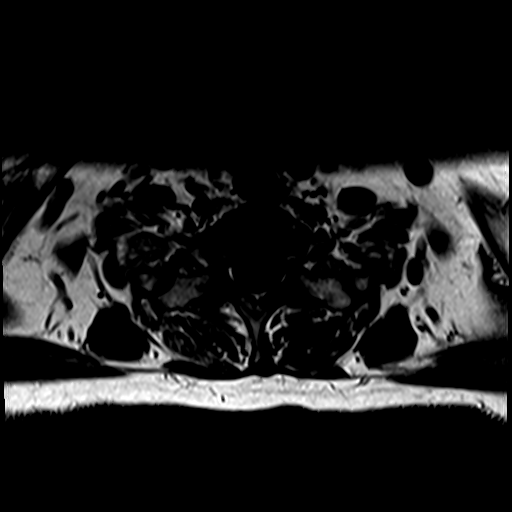
[im 13/31]
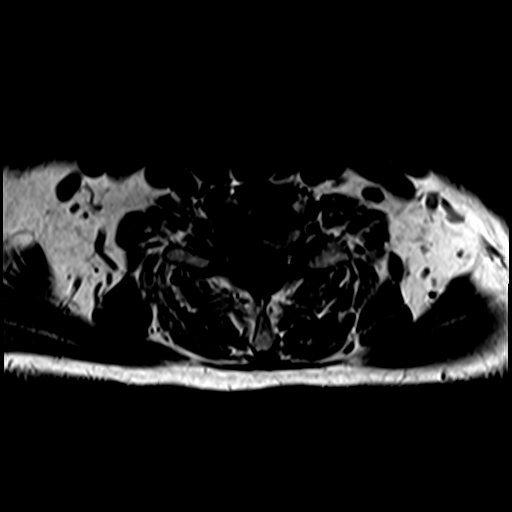
[im 18/31]
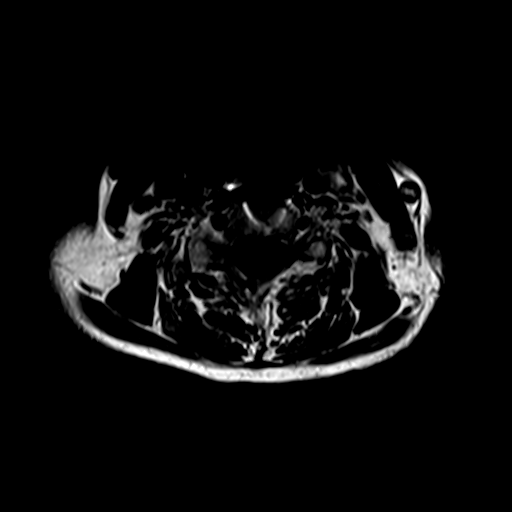
[im 22/31]
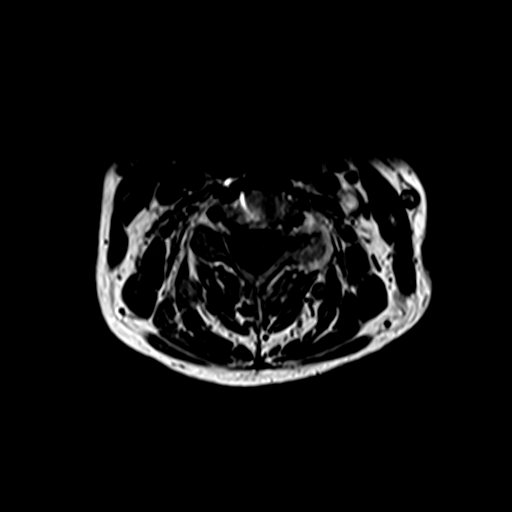
[im 26/31]
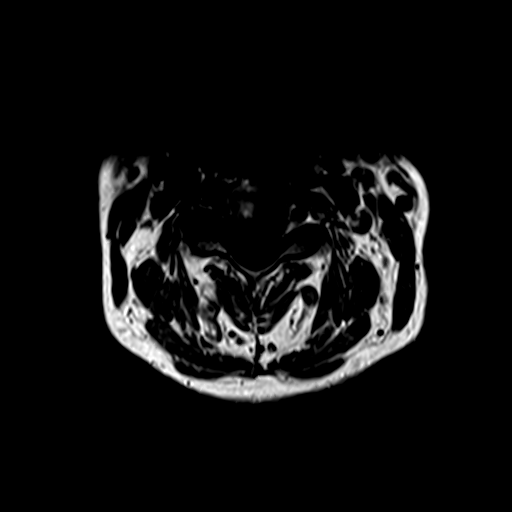
[im 31/31]
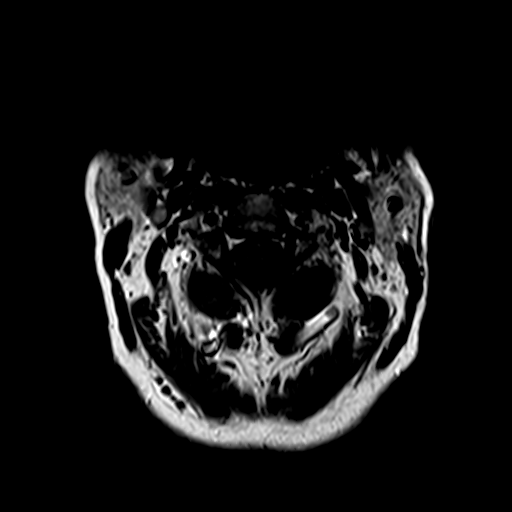

[Series 12: T1 post-contrast · axial · 3.0mm · 0.35mm/px · z∈[-99,-42]mm · 5 of 31 slices shown]
[im 1/31]
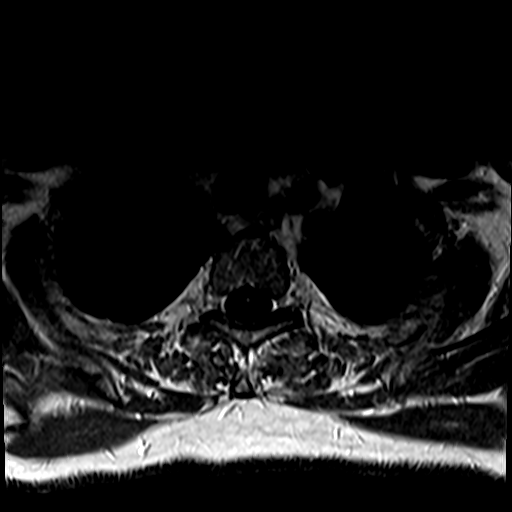
[im 5/31]
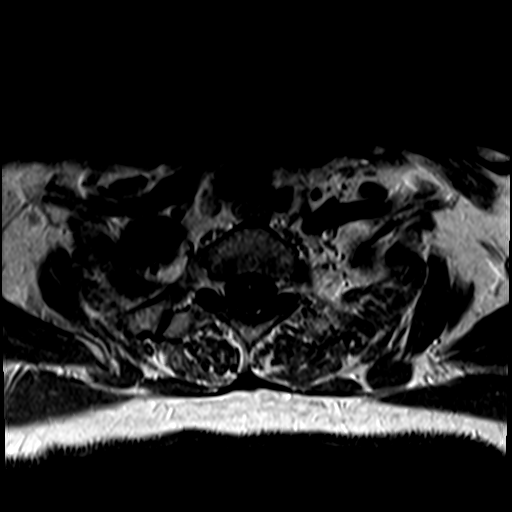
[im 9/31]
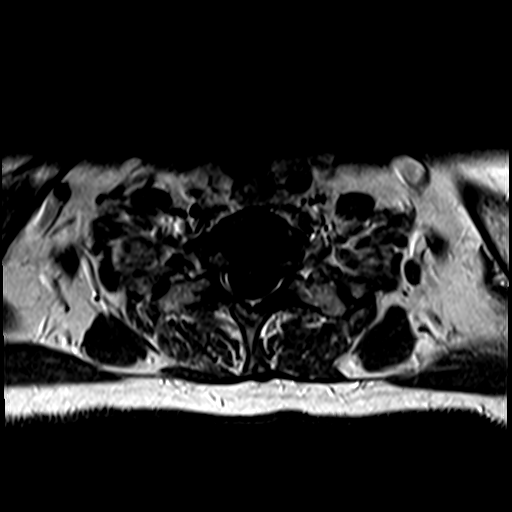
[im 13/31]
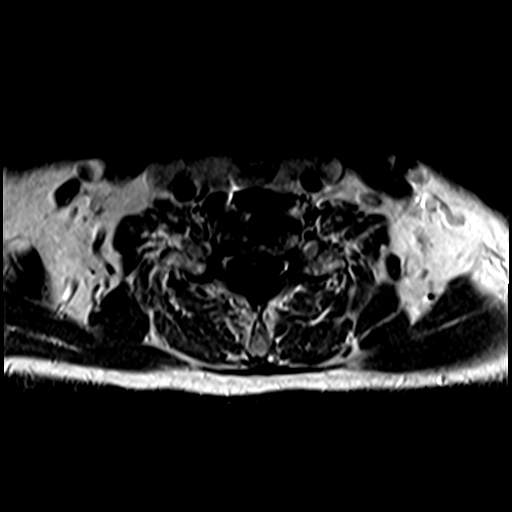
[im 18/31]
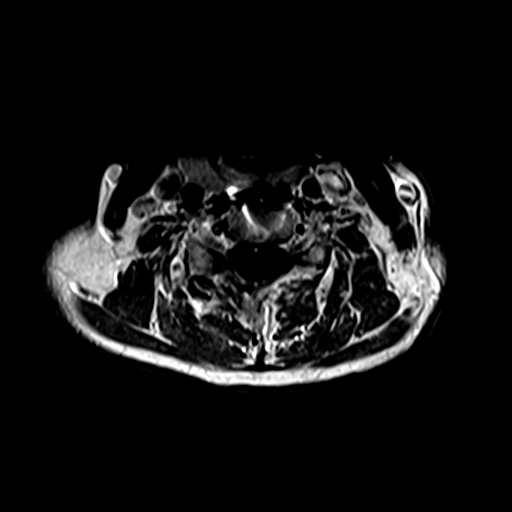

[29 of 48 positions shown; findings below may reference images not displayed]

FINDINGS: Alignment: Maintained with straightening of lordosis noted.

Vertebrae: No fracture or worrisome lesion. Solid C4-6 fusion noted.

Cord: Normal signal throughout.  No abnormal enhancement.

Posterior Fossa, vertebral arteries, paraspinal tissues: Negative.

Disc levels:

C2-3: Mild-to-moderate facet degenerative change, worse on the left.
Otherwise negative.

C3-4: Mild-to-moderate facet degenerative change is worse on the
right. Shallow disc bulge to the right and uncovertebral spurring on
the right cause moderately severe right foraminal narrowing. The
central canal and left foramen are open.

C4-5: Fused.  The central canal and foramina are widely patent.

C5-6: Fused.  The central canal and foramina are widely patent.

C6-7: There is a shallow disc bulge and some uncovertebral spurring.
Mild central canal narrowing is present. Moderate to moderately
severe foraminal narrowing is worse on the right.

C7-T1: Bilateral facet degenerative change.  Otherwise negative.
IMPRESSION: Moderately severe right foraminal narrowing at C3-4 due to
uncovertebral and facet degeneration. The central canal and left
foramen are open.

Moderate to moderately severe bilateral foraminal narrowing at C6-7
mainly due to uncovertebral spurring is worse on the right. Mild
central canal narrowing is present at C6-7.

Status post C4-6 fusion. The central canal and foramina are widely
patent.

## 2018-07-07 MED ORDER — GADOBUTROL 1 MMOL/ML IV SOLN
7.0000 mL | Freq: Once | INTRAVENOUS | Status: AC | PRN
  Administered 2018-07-07: 7 mL via INTRAVENOUS

## 2018-07-27 ENCOUNTER — Encounter: Payer: Self-pay | Admitting: Family Medicine

## 2018-07-28 ENCOUNTER — Encounter: Payer: Self-pay | Admitting: Family Medicine

## 2018-07-28 ENCOUNTER — Ambulatory Visit (INDEPENDENT_AMBULATORY_CARE_PROVIDER_SITE_OTHER): Payer: Self-pay | Admitting: Family Medicine

## 2018-07-28 DIAGNOSIS — E039 Hypothyroidism, unspecified: Secondary | ICD-10-CM

## 2018-07-28 DIAGNOSIS — Z131 Encounter for screening for diabetes mellitus: Secondary | ICD-10-CM

## 2018-07-28 DIAGNOSIS — E785 Hyperlipidemia, unspecified: Secondary | ICD-10-CM

## 2018-07-28 DIAGNOSIS — G8929 Other chronic pain: Secondary | ICD-10-CM

## 2018-07-28 DIAGNOSIS — K2950 Unspecified chronic gastritis without bleeding: Secondary | ICD-10-CM

## 2018-07-28 DIAGNOSIS — Z1239 Encounter for other screening for malignant neoplasm of breast: Secondary | ICD-10-CM

## 2018-07-28 DIAGNOSIS — T402X5A Adverse effect of other opioids, initial encounter: Secondary | ICD-10-CM

## 2018-07-28 DIAGNOSIS — K5903 Drug induced constipation: Secondary | ICD-10-CM

## 2018-07-28 DIAGNOSIS — Z981 Arthrodesis status: Secondary | ICD-10-CM

## 2018-07-28 DIAGNOSIS — Z79899 Other long term (current) drug therapy: Secondary | ICD-10-CM

## 2018-07-28 DIAGNOSIS — F3131 Bipolar disorder, current episode depressed, mild: Secondary | ICD-10-CM

## 2018-07-28 DIAGNOSIS — F5104 Psychophysiologic insomnia: Secondary | ICD-10-CM

## 2018-07-28 DIAGNOSIS — M542 Cervicalgia: Secondary | ICD-10-CM

## 2018-07-28 MED ORDER — BUSPIRONE HCL 10 MG PO TABS: 10.0000 mg | ORAL_TABLET | Freq: Two times a day (BID) | ORAL | 1 refills | Status: DC

## 2018-07-28 MED ORDER — ESCITALOPRAM OXALATE 10 MG PO TABS: ORAL_TABLET | ORAL | 1 refills | Status: DC

## 2018-07-28 MED ORDER — QUETIAPINE FUMARATE 25 MG PO TABS: 25.0000 mg | ORAL_TABLET | Freq: Every day | ORAL | 1 refills | Status: DC

## 2018-07-28 MED ORDER — PANTOPRAZOLE SODIUM 40 MG PO TBEC: 40.0000 mg | DELAYED_RELEASE_TABLET | Freq: Every day | ORAL | 1 refills | Status: DC

## 2018-07-28 MED ORDER — LEVOTHYROXINE SODIUM 88 MCG PO TABS: ORAL_TABLET | ORAL | 1 refills | Status: DC
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
## 2018-08-03 ENCOUNTER — Encounter: Payer: Self-pay | Admitting: Family Medicine

## 2018-08-11 DIAGNOSIS — M4722 Other spondylosis with radiculopathy, cervical region: Secondary | ICD-10-CM | POA: Diagnosis not present

## 2018-08-11 DIAGNOSIS — M961 Postlaminectomy syndrome, not elsewhere classified: Secondary | ICD-10-CM | POA: Diagnosis not present

## 2018-08-11 DIAGNOSIS — M62830 Muscle spasm of back: Secondary | ICD-10-CM | POA: Diagnosis not present

## 2018-08-11 DIAGNOSIS — K5903 Drug induced constipation: Secondary | ICD-10-CM | POA: Diagnosis not present

## 2018-08-11 DIAGNOSIS — Z79891 Long term (current) use of opiate analgesic: Secondary | ICD-10-CM | POA: Diagnosis not present

## 2018-08-11 DIAGNOSIS — M4124 Other idiopathic scoliosis, thoracic region: Secondary | ICD-10-CM | POA: Diagnosis not present

## 2018-08-11 DIAGNOSIS — G894 Chronic pain syndrome: Secondary | ICD-10-CM | POA: Diagnosis not present

## 2018-08-18 DIAGNOSIS — M542 Cervicalgia: Secondary | ICD-10-CM | POA: Diagnosis not present

## 2018-09-01 DIAGNOSIS — Z79891 Long term (current) use of opiate analgesic: Secondary | ICD-10-CM | POA: Diagnosis not present

## 2018-09-01 DIAGNOSIS — G894 Chronic pain syndrome: Secondary | ICD-10-CM | POA: Diagnosis not present

## 2018-09-22 ENCOUNTER — Other Ambulatory Visit: Payer: Self-pay | Admitting: Neurological Surgery

## 2018-09-22 DIAGNOSIS — M542 Cervicalgia: Secondary | ICD-10-CM

## 2018-10-01 ENCOUNTER — Other Ambulatory Visit: Payer: Self-pay | Admitting: Family Medicine

## 2018-10-01 DIAGNOSIS — F3131 Bipolar disorder, current episode depressed, mild: Secondary | ICD-10-CM

## 2018-10-01 NOTE — Telephone Encounter (Signed)
Refill request for general medication. Buspar   Last office visit 07/28/2018   Follow up on 12/28/2018

## 2018-10-20 DIAGNOSIS — M4722 Other spondylosis with radiculopathy, cervical region: Secondary | ICD-10-CM | POA: Diagnosis not present

## 2018-10-20 DIAGNOSIS — M62830 Muscle spasm of back: Secondary | ICD-10-CM | POA: Diagnosis not present

## 2018-10-20 DIAGNOSIS — G894 Chronic pain syndrome: Secondary | ICD-10-CM | POA: Diagnosis not present

## 2018-10-20 DIAGNOSIS — M961 Postlaminectomy syndrome, not elsewhere classified: Secondary | ICD-10-CM | POA: Diagnosis not present

## 2018-10-20 DIAGNOSIS — M4124 Other idiopathic scoliosis, thoracic region: Secondary | ICD-10-CM | POA: Diagnosis not present

## 2018-10-20 DIAGNOSIS — K5903 Drug induced constipation: Secondary | ICD-10-CM | POA: Diagnosis not present

## 2018-10-20 DIAGNOSIS — Z79891 Long term (current) use of opiate analgesic: Secondary | ICD-10-CM | POA: Diagnosis not present

## 2018-11-03 ENCOUNTER — Other Ambulatory Visit: Payer: Self-pay | Admitting: Family Medicine

## 2018-11-03 DIAGNOSIS — F3131 Bipolar disorder, current episode depressed, mild: Secondary | ICD-10-CM

## 2018-12-03 ENCOUNTER — Ambulatory Visit: Payer: Medicare HMO

## 2018-12-07 ENCOUNTER — Other Ambulatory Visit: Payer: Self-pay | Admitting: Family Medicine

## 2018-12-07 DIAGNOSIS — F3131 Bipolar disorder, current episode depressed, mild: Secondary | ICD-10-CM

## 2018-12-16 ENCOUNTER — Ambulatory Visit
Admission: RE | Admit: 2018-12-16 | Discharge: 2018-12-16 | Disposition: A | Discharge status: Home / Self Care | Payer: Medicare HMO | Source: Ambulatory Visit | Attending: Neurological Surgery | Admitting: Neurological Surgery

## 2018-12-16 ENCOUNTER — Other Ambulatory Visit: Payer: Self-pay

## 2018-12-16 DIAGNOSIS — M542 Cervicalgia: Secondary | ICD-10-CM | POA: Diagnosis not present

## 2018-12-16 DIAGNOSIS — M5023 Other cervical disc displacement, cervicothoracic region: Secondary | ICD-10-CM | POA: Diagnosis not present

## 2018-12-16 DIAGNOSIS — M47812 Spondylosis without myelopathy or radiculopathy, cervical region: Secondary | ICD-10-CM | POA: Diagnosis not present

## 2018-12-16 DIAGNOSIS — M4803 Spinal stenosis, cervicothoracic region: Secondary | ICD-10-CM | POA: Diagnosis not present

## 2018-12-16 DIAGNOSIS — M4602 Spinal enthesopathy, cervical region: Secondary | ICD-10-CM | POA: Diagnosis not present

## 2018-12-16 DIAGNOSIS — M4802 Spinal stenosis, cervical region: Secondary | ICD-10-CM | POA: Diagnosis not present

## 2018-12-16 DIAGNOSIS — Z981 Arthrodesis status: Secondary | ICD-10-CM | POA: Diagnosis not present

## 2018-12-16 DIAGNOSIS — R202 Paresthesia of skin: Secondary | ICD-10-CM | POA: Diagnosis not present

## 2018-12-16 IMAGING — CT CT CERVICAL SPINE WITHOUT CONTRAST
4 of 6 series · 13 of 33 positions shown, 15 images · non-contrast
Comparison: MRI of the cervical spine [DATE] CT of the cervical
spine [DATE]

CLINICAL DATA: Remote trauma and surgery to the cervical spine.
Degenerative disc disease. Chronic right upper extremity numbness
and paresthesias. New onset left-sided paresthesias.

EXAM:
CT CERVICAL SPINE WITHOUT CONTRAST
TECHNIQUE: Multidetector CT imaging of the cervical spine was performed without
intravenous contrast. Multiplanar CT image reconstructions were also
generated.

[Series 2: axial bone c-spine · axial · 0.30mm/px · z∈[-633,-507]mm · 4 of 105 slices shown]
[im 21/105  bone]
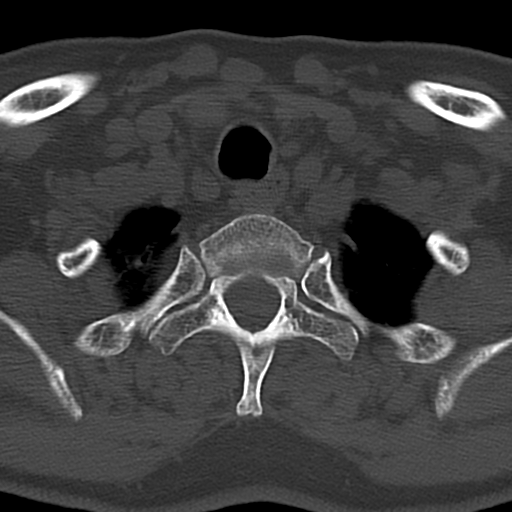
[im 42/105  bone]
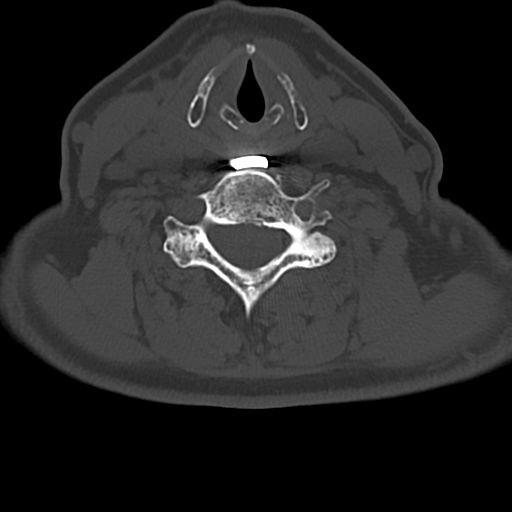
[im 63/105  bone]
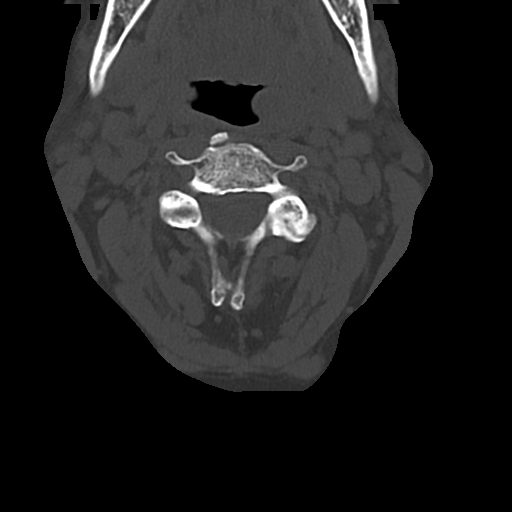
[im 84/105  bone]
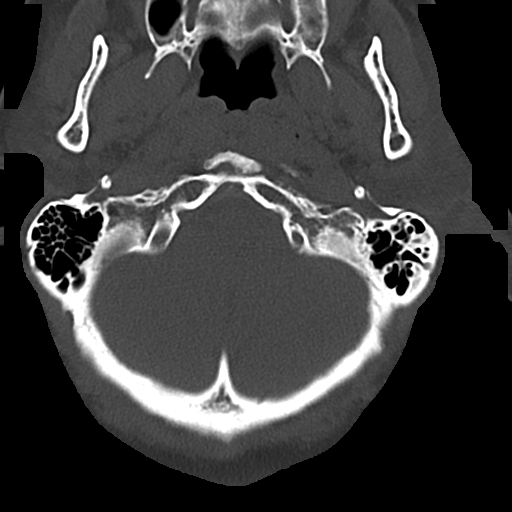

[Series 6: sagittal bone c-spine · sagittal · 0.30mm/px · 5 of 56 slices shown, 6 images]
[im 19/56  bone]
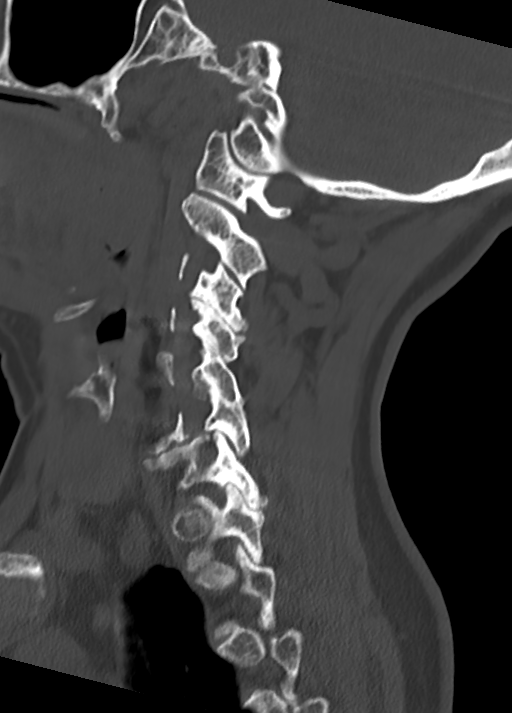
[im 23/56  bone]
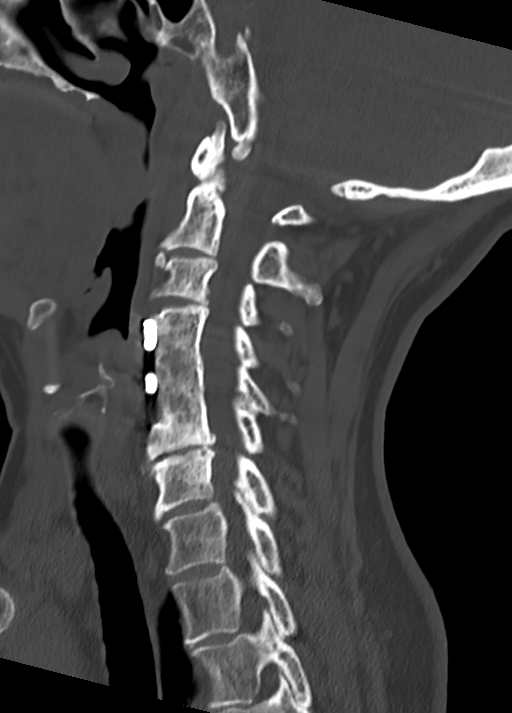
[im 28/56  soft-tissue]
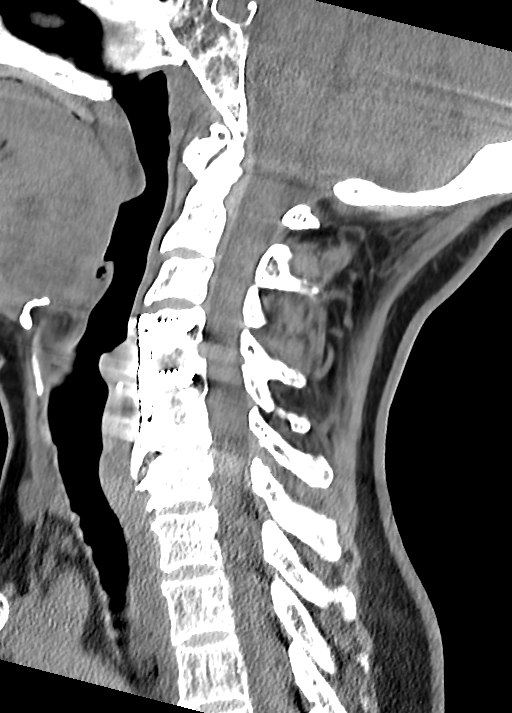
[im 28/56  bone]
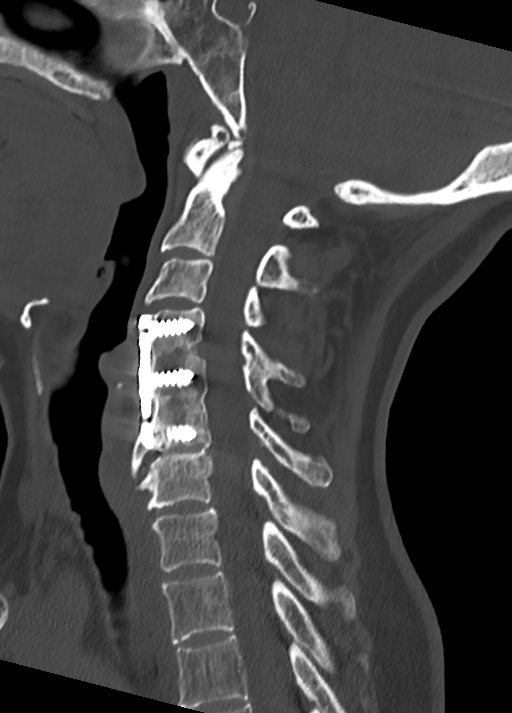
[im 33/56  bone]
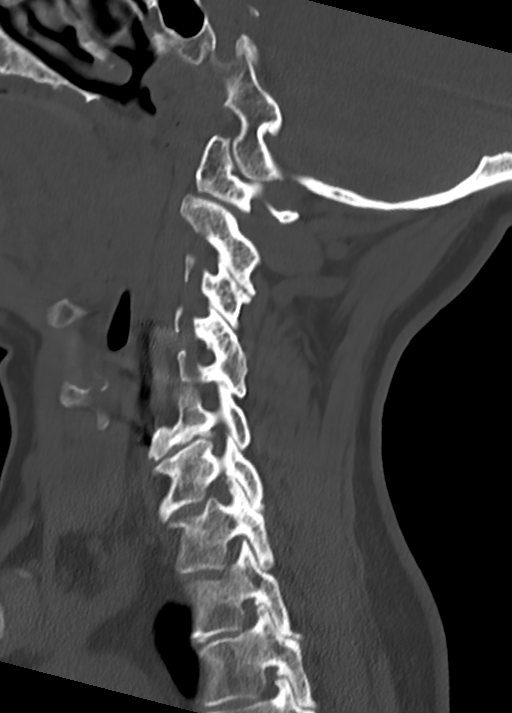
[im 37/56  bone]
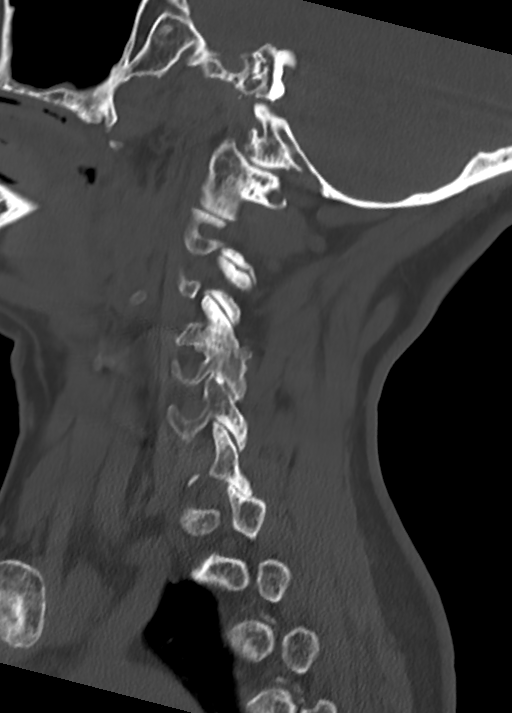

[Series 9: orthogonal axial c-spine · 1 of 56 slices shown (1 of 2)]
[im 28/56  bone]
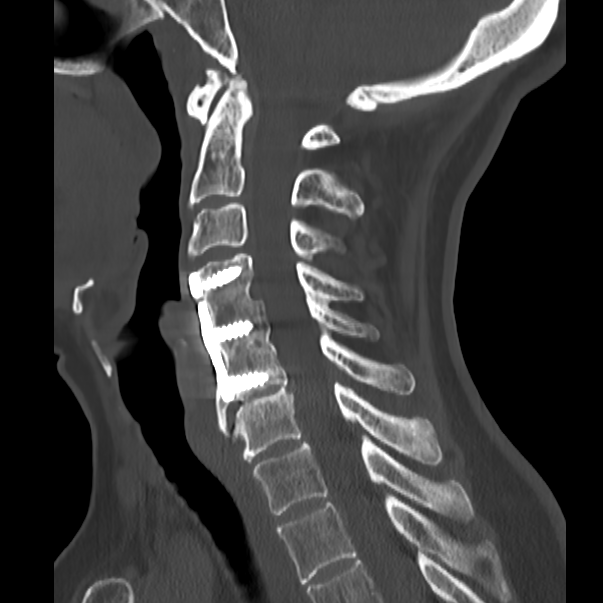

[Series 10: orthogonal axial c-spine · axial · 0.29mm/px · z∈[-689,-464]mm · 3 of 64 slices shown, 4 images (2 of 2)]
[im 1/64  soft-tissue]
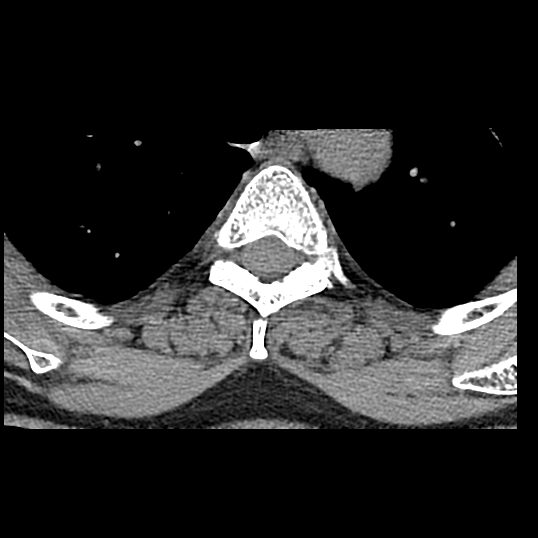
[im 1/64  bone]
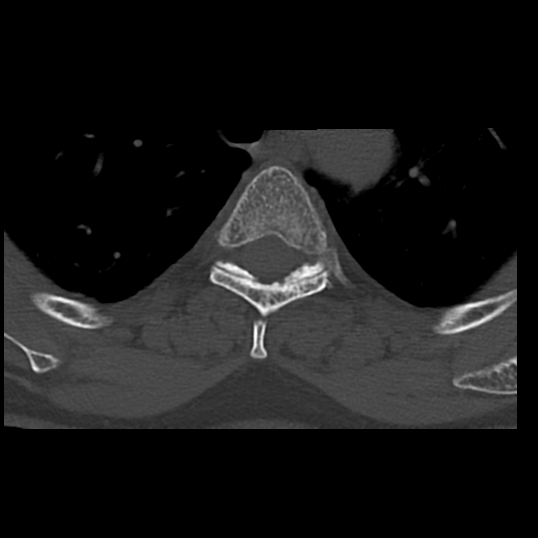
[im 32/64  bone]
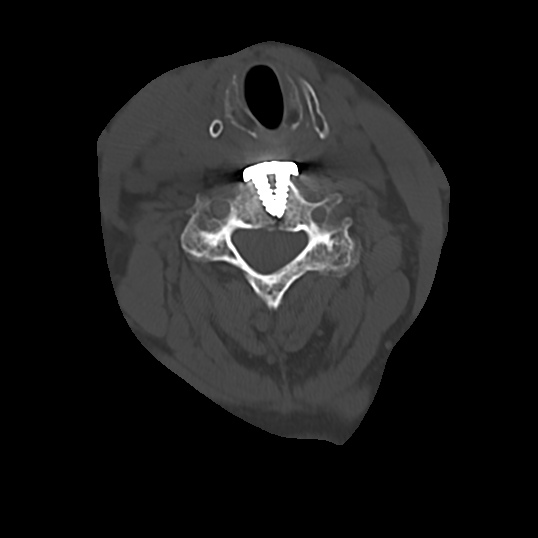
[im 64/64  bone]
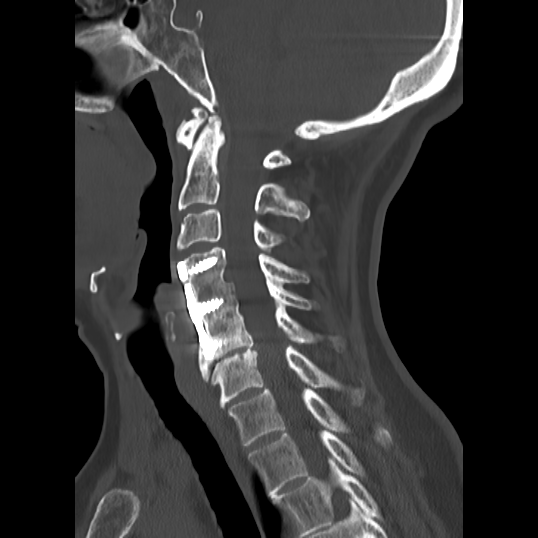

[13 of 33 positions shown; findings below may reference images not displayed]

FINDINGS: Alignment: Normal cervical lordosis is present. There is no
significant listhesis.

Skull base and vertebrae: Craniocervical junction is normal.
Vertebral body heights are maintained. No acute or healing fractures
are present.

Soft tissues and spinal canal: Paraspinous soft tissues are within
normal limits. Canal is unremarkable. Salivary glands are normal. No
focal mucosal or submucosal lesions are present. Thyroid is mildly
heterogeneous without a dominant lesion.

Disc levels: See 2 3 mild uncovertebral and facet disease is present
bilaterally. There is mild bilateral foraminal narrowing.

C3-4: Asymmetric right-sided facet hypertrophy and uncovertebral
disease leads to moderate right foraminal stenosis. Mild left
foraminal narrowing is present.

C4-5: Solid fusion is present anteriorly. No residual or recurrent
stenosis is present.

C5-6: Solid fusion is present anteriorly. Mild uncovertebral
spurring remains the left. There is no residual or recurrent
stenosis.

C6-7: Adjacent level disease is present with uncovertebral spurring
bilaterally leading to moderate bilateral foraminal narrowing, right
greater than left.

C7-T1: Moderate facet hypertrophy is present bilaterally. There is
mild disc bulging. This results in mild bilateral foraminal
narrowing.

Upper chest: The lung apices are clear. No focal nodule, mass, or
airspace disease is present. Thoracic inlet is within normal limits.
IMPRESSION: 1. Solid fusion at C4-5 and C5-6 without residual or recurrent
stenosis at these levels.
2. Adjacent level disease at C4-5 results in moderate right
foraminal stenosis and mild left foraminal narrowing due to
asymmetric right-sided facet hypertrophy and uncovertebral disease.
3. Adjacent level disease at C6-7 results in uncovertebral and facet
disease leading to moderate bilateral foraminal stenosis, right
greater than left. This potentially impacts the C7 nerve roots.
4. Mild foraminal narrowing is present bilaterally at C7-T1 due to
moderate facet hypertrophy.

## 2018-12-24 DIAGNOSIS — Z79891 Long term (current) use of opiate analgesic: Secondary | ICD-10-CM | POA: Diagnosis not present

## 2018-12-24 DIAGNOSIS — M4722 Other spondylosis with radiculopathy, cervical region: Secondary | ICD-10-CM | POA: Diagnosis not present

## 2018-12-24 DIAGNOSIS — G894 Chronic pain syndrome: Secondary | ICD-10-CM | POA: Diagnosis not present

## 2018-12-24 DIAGNOSIS — M961 Postlaminectomy syndrome, not elsewhere classified: Secondary | ICD-10-CM | POA: Diagnosis not present

## 2018-12-28 ENCOUNTER — Other Ambulatory Visit: Payer: Self-pay

## 2018-12-28 ENCOUNTER — Ambulatory Visit (INDEPENDENT_AMBULATORY_CARE_PROVIDER_SITE_OTHER): Payer: Medicare HMO | Admitting: Family Medicine

## 2018-12-28 ENCOUNTER — Encounter: Payer: Self-pay | Admitting: Family Medicine

## 2018-12-28 ENCOUNTER — Other Ambulatory Visit (HOSPITAL_COMMUNITY)
Admission: RE | Admit: 2018-12-28 | Discharge: 2018-12-28 | Disposition: A | Discharge status: Home / Self Care | Payer: Medicare HMO | Source: Ambulatory Visit | Attending: Family Medicine | Admitting: Family Medicine

## 2018-12-28 VITALS — BP 124/70 | HR 77 | Temp 97.5°F | Resp 16 | Ht 63.75 in | Wt 137.0 lb

## 2018-12-28 DIAGNOSIS — F5104 Psychophysiologic insomnia: Secondary | ICD-10-CM

## 2018-12-28 DIAGNOSIS — D229 Melanocytic nevi, unspecified: Secondary | ICD-10-CM

## 2018-12-28 DIAGNOSIS — Z01419 Encounter for gynecological examination (general) (routine) without abnormal findings: Secondary | ICD-10-CM | POA: Diagnosis not present

## 2018-12-28 DIAGNOSIS — Z124 Encounter for screening for malignant neoplasm of cervix: Secondary | ICD-10-CM | POA: Insufficient documentation

## 2018-12-28 DIAGNOSIS — E2839 Other primary ovarian failure: Secondary | ICD-10-CM | POA: Diagnosis not present

## 2018-12-28 DIAGNOSIS — E785 Hyperlipidemia, unspecified: Secondary | ICD-10-CM | POA: Diagnosis not present

## 2018-12-28 DIAGNOSIS — Z79899 Other long term (current) drug therapy: Secondary | ICD-10-CM | POA: Diagnosis not present

## 2018-12-28 DIAGNOSIS — Z131 Encounter for screening for diabetes mellitus: Secondary | ICD-10-CM | POA: Diagnosis not present

## 2018-12-28 DIAGNOSIS — Z780 Asymptomatic menopausal state: Secondary | ICD-10-CM | POA: Diagnosis not present

## 2018-12-28 DIAGNOSIS — R69 Illness, unspecified: Secondary | ICD-10-CM | POA: Diagnosis not present

## 2018-12-28 DIAGNOSIS — Z122 Encounter for screening for malignant neoplasm of respiratory organs: Secondary | ICD-10-CM

## 2018-12-28 DIAGNOSIS — Z1151 Encounter for screening for human papillomavirus (HPV): Secondary | ICD-10-CM | POA: Diagnosis not present

## 2018-12-28 DIAGNOSIS — M542 Cervicalgia: Secondary | ICD-10-CM | POA: Diagnosis not present

## 2018-12-28 DIAGNOSIS — E039 Hypothyroidism, unspecified: Secondary | ICD-10-CM

## 2018-12-28 DIAGNOSIS — K2950 Unspecified chronic gastritis without bleeding: Secondary | ICD-10-CM

## 2018-12-28 DIAGNOSIS — F3131 Bipolar disorder, current episode depressed, mild: Secondary | ICD-10-CM

## 2018-12-28 DIAGNOSIS — G8929 Other chronic pain: Secondary | ICD-10-CM

## 2018-12-28 DIAGNOSIS — F1021 Alcohol dependence, in remission: Secondary | ICD-10-CM

## 2018-12-28 MED ORDER — PANTOPRAZOLE SODIUM 40 MG PO TBEC: 40.0000 mg | DELAYED_RELEASE_TABLET | Freq: Every day | ORAL | 1 refills | Status: DC

## 2018-12-28 MED ORDER — BUSPIRONE HCL 10 MG PO TABS: 10.0000 mg | ORAL_TABLET | Freq: Two times a day (BID) | ORAL | 1 refills | Status: DC

## 2018-12-28 MED ORDER — ESCITALOPRAM OXALATE 10 MG PO TABS: ORAL_TABLET | ORAL | 1 refills | Status: DC

## 2018-12-28 MED ORDER — QUETIAPINE FUMARATE 25 MG PO TABS: 25.0000 mg | ORAL_TABLET | Freq: Every day | ORAL | 1 refills | Status: DC

## 2018-12-28 NOTE — Patient Instructions (Signed)
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
## 2018-12-29 ENCOUNTER — Telehealth: Payer: Self-pay | Admitting: *Deleted

## 2018-12-29 ENCOUNTER — Other Ambulatory Visit: Payer: Self-pay | Admitting: Family Medicine

## 2018-12-29 DIAGNOSIS — E039 Hypothyroidism, unspecified: Secondary | ICD-10-CM

## 2018-12-29 DIAGNOSIS — Z122 Encounter for screening for malignant neoplasm of respiratory organs: Secondary | ICD-10-CM

## 2018-12-29 DIAGNOSIS — Z87891 Personal history of nicotine dependence: Secondary | ICD-10-CM

## 2018-12-29 LAB — TSH: TSH: 4.76 mIU/L — ABNORMAL HIGH (ref 0.40–4.50)

## 2018-12-29 LAB — LIPID PANEL
Cholesterol: 191 mg/dL (ref <200)
HDL: 52 mg/dL (ref >=50)
LDL Cholesterol (Calc): 113 mg/dL (calc) — ABNORMAL HIGH
Non-HDL Cholesterol (Calc): 139 mg/dL (calc) — ABNORMAL HIGH (ref <130)
Total CHOL/HDL Ratio: 3.7 (calc) (ref <5.0)
Triglycerides: 144 mg/dL (ref <150)

## 2018-12-29 LAB — COMPLETE METABOLIC PANEL WITH GFR
AG Ratio: 1.3 (calc) (ref 1.0–2.5)
ALT: 21 U/L (ref 6–29)
AST: 20 U/L (ref 10–35)
Albumin: 4.3 g/dL (ref 3.6–5.1)
Alkaline phosphatase (APISO): 86 U/L (ref 37–153)
BUN: 20 mg/dL (ref 7–25)
CO2: 34 mmol/L — ABNORMAL HIGH (ref 20–32)
Calcium: 9.9 mg/dL (ref 8.6–10.4)
Chloride: 103 mmol/L (ref 98–110)
Creat: 1.03 mg/dL (ref 0.50–1.05)
GFR, Est African American: 70 mL/min/{1.73_m2} (ref >=60)
GFR, Est Non African American: 60 mL/min/{1.73_m2} (ref >=60)
Globulin: 3.2 g/dL (calc) (ref 1.9–3.7)
Glucose, Bld: 87 mg/dL (ref 65–99)
Potassium: 4.2 mmol/L (ref 3.5–5.3)
Sodium: 140 mmol/L (ref 135–146)
Total Bilirubin: 0.4 mg/dL (ref 0.2–1.2)
Total Protein: 7.5 g/dL (ref 6.1–8.1)

## 2018-12-29 LAB — CBC WITH DIFFERENTIAL/PLATELET
Absolute Monocytes: 331 cells/uL (ref 200–950)
Basophils Absolute: 41 cells/uL (ref 0–200)
Basophils Relative: 0.7 %
Eosinophils Absolute: 168 cells/uL (ref 15–500)
Eosinophils Relative: 2.9 %
HCT: 41 % (ref 35.0–45.0)
Hemoglobin: 13.5 g/dL (ref 11.7–15.5)
Lymphs Abs: 2442 cells/uL (ref 850–3900)
MCH: 28.7 pg (ref 27.0–33.0)
MCHC: 32.9 g/dL (ref 32.0–36.0)
MCV: 87 fL (ref 80.0–100.0)
MPV: 12.5 fL (ref 7.5–12.5)
Monocytes Relative: 5.7 %
Neutro Abs: 2819 cells/uL (ref 1500–7800)
Neutrophils Relative %: 48.6 %
Platelets: 203 10*3/uL (ref 140–400)
RBC: 4.71 10*6/uL (ref 3.80–5.10)
RDW: 12.9 % (ref 11.0–15.0)
Total Lymphocyte: 42.1 %
WBC: 5.8 10*3/uL (ref 3.8–10.8)

## 2018-12-29 LAB — CYTOLOGY - PAP
Diagnosis: NEGATIVE
HPV: NOT DETECTED

## 2018-12-29 LAB — HEMOGLOBIN A1C
Hgb A1c MFr Bld: 5.3 % of total Hgb (ref <5.7)
Mean Plasma Glucose: 105 (calc)
eAG (mmol/L): 5.8 (calc)

## 2018-12-29 NOTE — Telephone Encounter (Signed)
Received referral for initial lung cancer screening scan. Contacted patient and obtained smoking history,(former, quit 07/05/18, 40 pack year) as well as answering questions related to screening process. Patient denies signs of lung cancer such as weight loss or hemoptysis. Patient denies comorbidity that would prevent curative treatment if lung cancer were found. Patient is scheduled for shared decision making visit and CT scan on 01/05/19 at 115pm.

## 2019-01-05 ENCOUNTER — Encounter: Payer: Self-pay | Admitting: Nurse Practitioner

## 2019-01-05 ENCOUNTER — Ambulatory Visit
Admission: RE | Admit: 2019-01-05 | Discharge: 2019-01-05 | Disposition: A | Discharge status: Home / Self Care | Payer: Medicare HMO | Source: Ambulatory Visit | Attending: Nurse Practitioner | Admitting: Nurse Practitioner

## 2019-01-05 ENCOUNTER — Other Ambulatory Visit: Payer: Self-pay

## 2019-01-05 ENCOUNTER — Inpatient Hospital Stay: Payer: Medicare HMO | Attending: Nurse Practitioner | Admitting: Hospice and Palliative Medicine

## 2019-01-05 DIAGNOSIS — Z87891 Personal history of nicotine dependence: Secondary | ICD-10-CM

## 2019-01-05 DIAGNOSIS — Z122 Encounter for screening for malignant neoplasm of respiratory organs: Secondary | ICD-10-CM | POA: Insufficient documentation

## 2019-01-05 IMAGING — CT CT CHEST LUNG CANCER SCREENING LOW DOSE W/O CM
2 of 5 series · 15 of 40 positions shown, 18 images · non-contrast
Comparison: None.

CLINICAL DATA: 58-year-old female former smoker, quit 6 months ago,
with 40 pack-year history of smoking, for initial lung cancer
screening

EXAM:
CT CHEST WITHOUT CONTRAST LOW-DOSE FOR LUNG CANCER SCREENING
TECHNIQUE: Multidetector CT imaging of the chest was performed following the
standard protocol without IV contrast.

[Series 3: lung · axial · 0.54mm/px · z∈[-1159,-864]mm · 12 of 325 slices shown, 15 images]
[im 15/325  mediastinal]
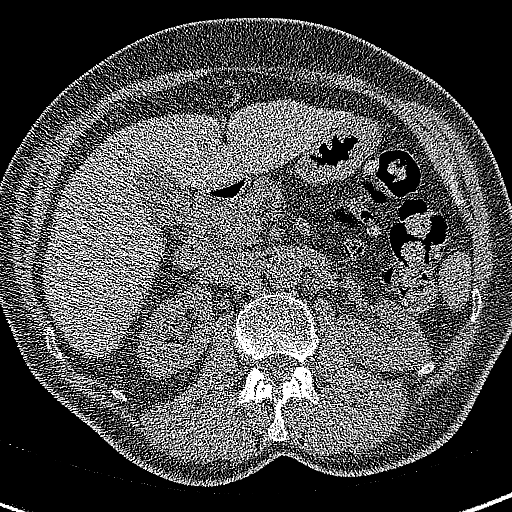
[im 15/325  lung]
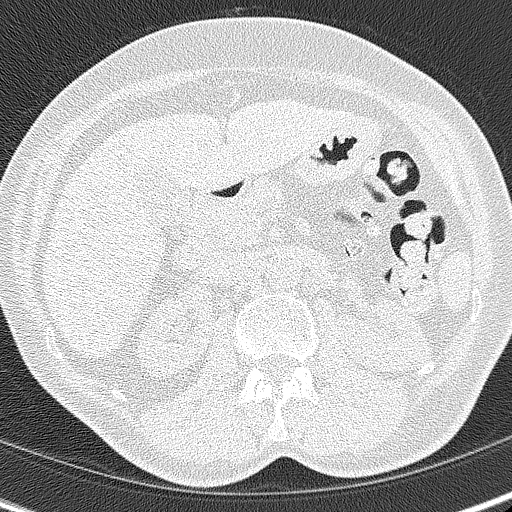
[im 45/325  lung]
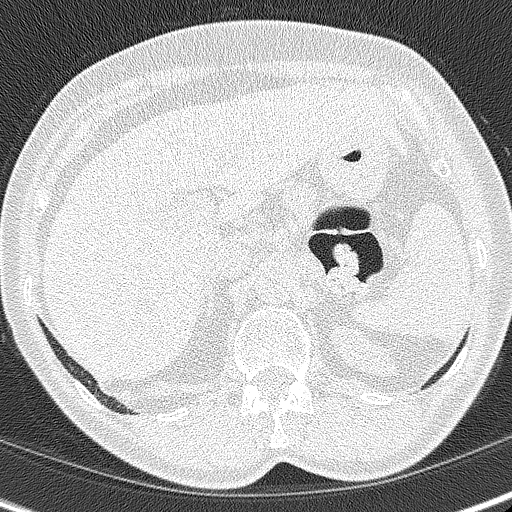
[im 74/325  lung]
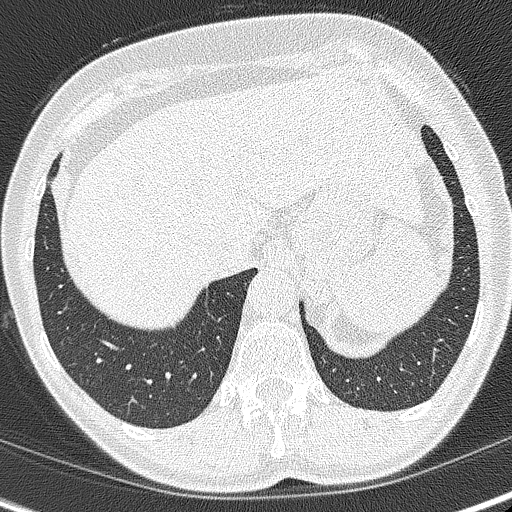
[im 104/325  lung]
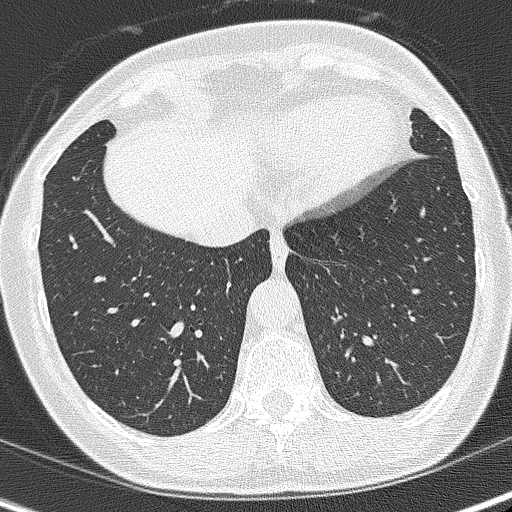
[im 118/325  mediastinal]
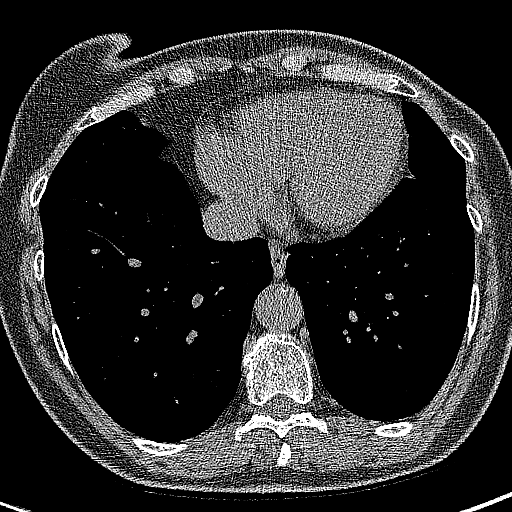
[im 118/325  lung]
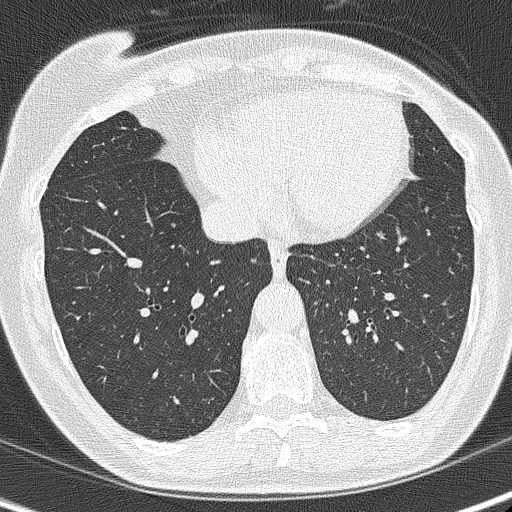
[im 148/325  lung]
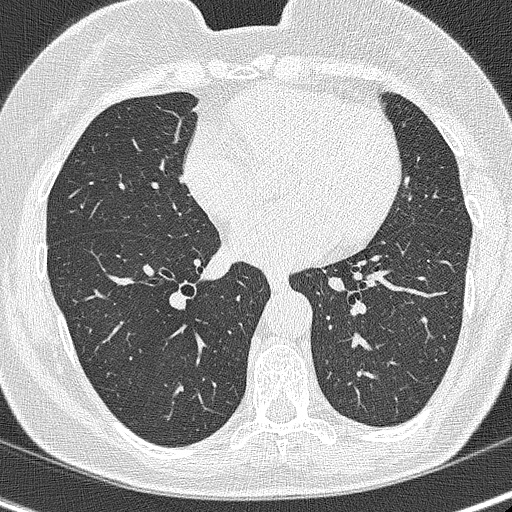
[im 177/325  lung]
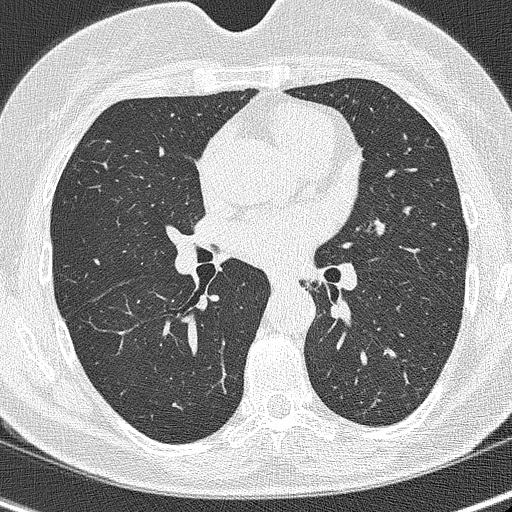
[im 207/325  lung]
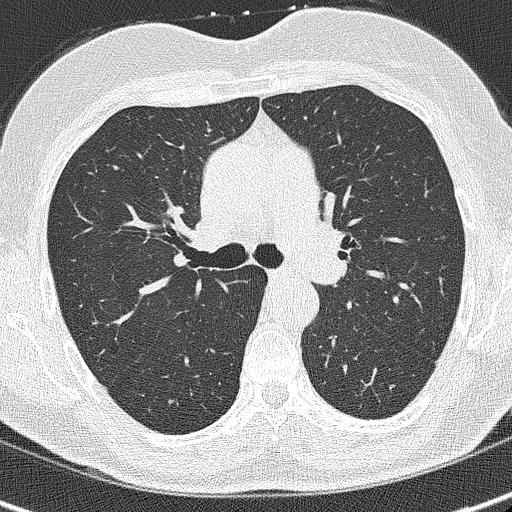
[im 221/325  mediastinal]
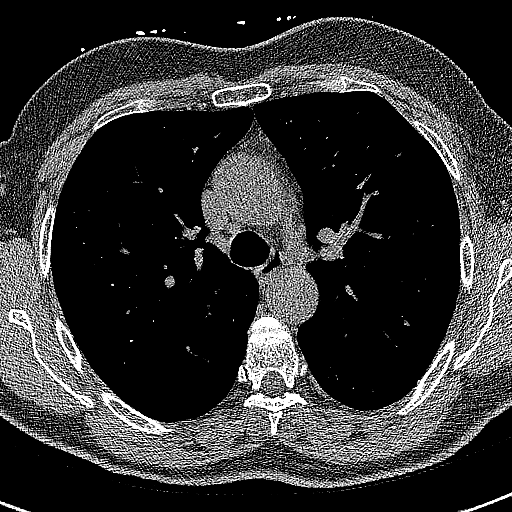
[im 221/325  lung]
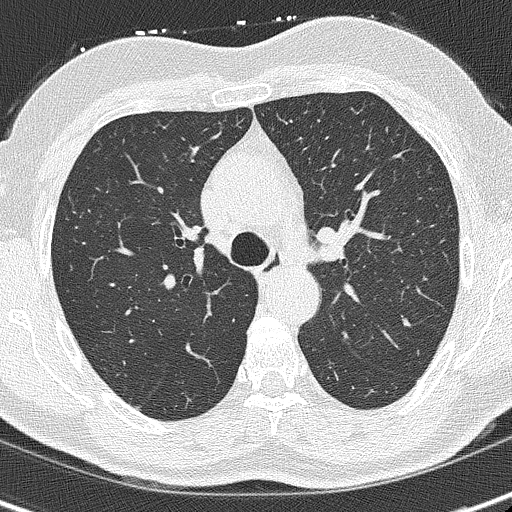
[im 251/325  lung]
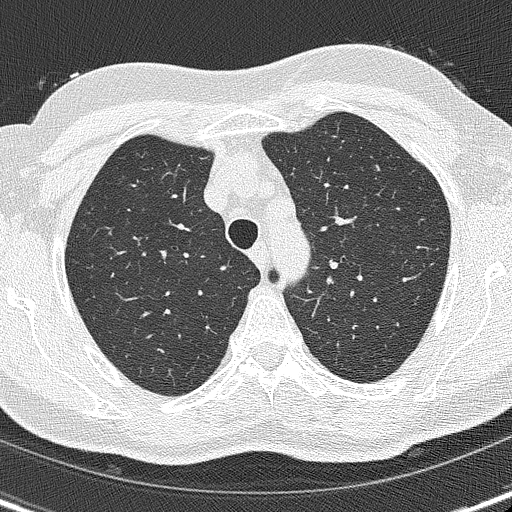
[im 280/325  lung]
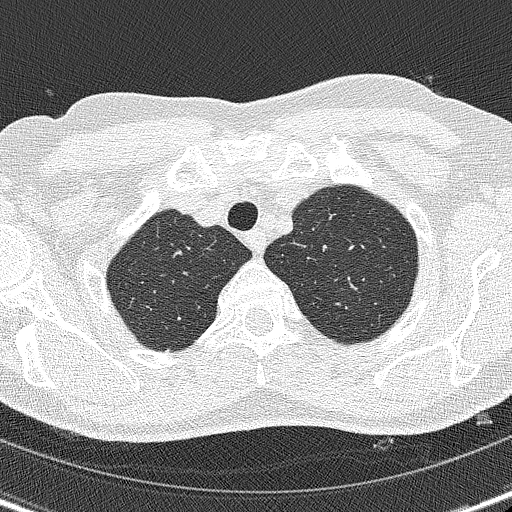
[im 310/325  lung]
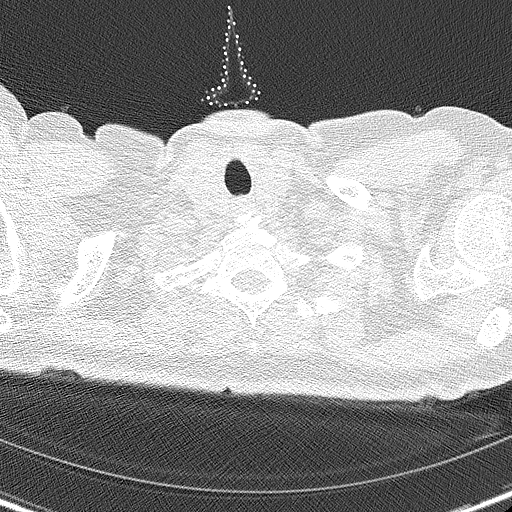

[Series 4: coronals lung · coronal · 0.54mm/px · 3 of 277 slices shown]
[im 56/277  lung]
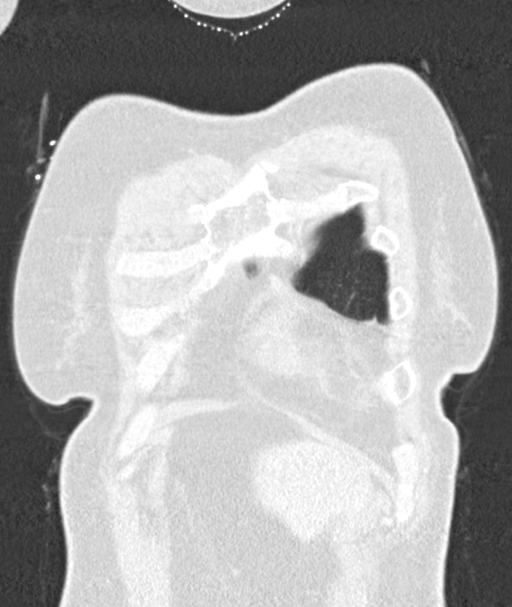
[im 111/277  lung]
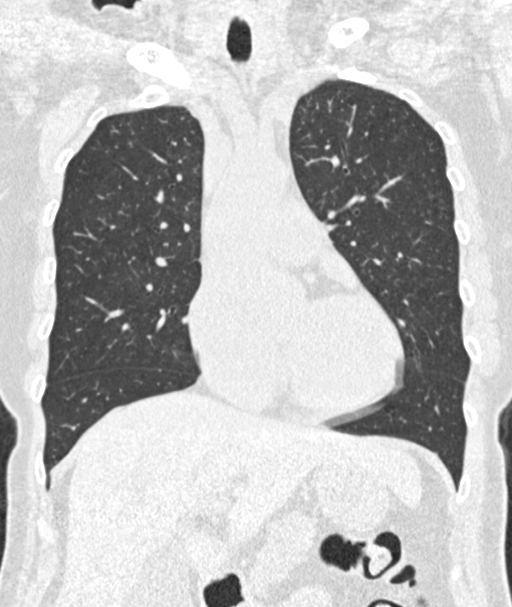
[im 166/277  lung]
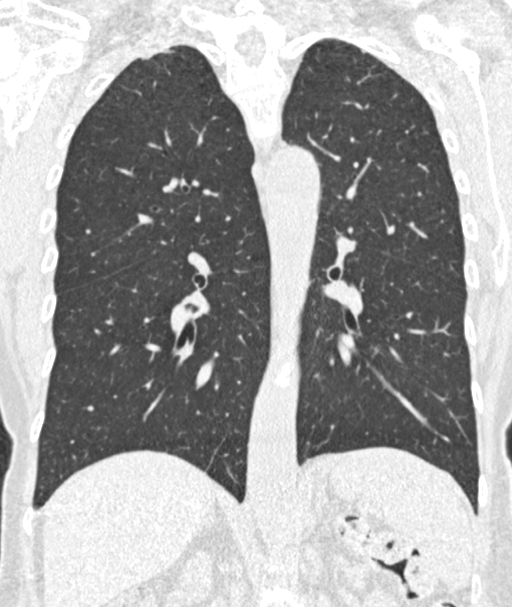

[15 of 40 positions shown; findings below may reference images not displayed]

FINDINGS: Cardiovascular: Heart is normal in size.  No pericardial effusion.

No evidence of thoracic aortic aneurysm.

Mediastinum/Nodes: No suspicious mediastinal lymphadenopathy.

Visualized thyroid is unremarkable.

Lungs/Pleura: Mild biapical pleural-parenchymal scarring.

No focal consolidation.

Mild centrilobular emphysematous changes.

5.8 mm nodule in the posterior right lower lobe.

No pleural effusion or pneumothorax.

Upper Abdomen: Visualized upper abdomen is unremarkable.

Musculoskeletal: Mild degenerative changes of the visualized
thoracolumbar spine.
IMPRESSION: Lung-RADS 2, benign appearance or behavior. Continue annual
screening with low-dose chest CT without contrast in 12 months.

Emphysema ([WF]-[WF]).

## 2019-01-05 NOTE — Progress Notes (Signed)
In accordance with CMS guidelines, patient has met eligibility criteria including age, absence of signs or symptoms of lung cancer.  Social History   Tobacco Use  . Smoking status: Former Smoker    Packs/day: 1.00    Years: 40.00    Pack years: 40.00    Types: Cigarettes    Quit date: 07/05/2018    Years since quitting: 0.5  . Smokeless tobacco: Never Used  Substance Use Topics  . Alcohol use: Not Currently    Alcohol/week: 0.0 standard drinks    Comment: used to drink heavily , quit in 2007   . Drug use: No      A shared decision-making session was conducted prior to the performance of CT scan. This includes one or more decision aids, includes benefits and harms of screening, follow-up diagnostic testing, over-diagnosis, false positive rate, and total radiation exposure.   Counseling on the importance of adherence to annual lung cancer LDCT screening, impact of co-morbidities, and ability or willingness to undergo diagnosis and treatment is imperative for compliance of the program.   Counseling on the importance of continued smoking cessation for former smokers; the importance of smoking cessation for current smokers, and information about tobacco cessation interventions have been given to patient including [place] and [facility] quit [place] programs.   Written order for lung cancer screening with LDCT has been given to the patient and any and all questions have been answered to the best of my abilities.    Yearly follow up will be coordinated by Burgess Estelle, Thoracic Navigator.  Time Total: 15 minutes  Visit consisted of counseling and education dealing with complex health screening. Greater than 50%  of this time was spent counseling and coordinating care related to the above assessment and plan.  Signed by: Altha Harm, PhD, NP-C [phone] (Work Cell)

## 2019-01-07 ENCOUNTER — Encounter: Payer: Self-pay | Admitting: *Deleted

## 2019-01-15 ENCOUNTER — Other Ambulatory Visit: Payer: Self-pay | Admitting: Family Medicine

## 2019-01-15 DIAGNOSIS — E039 Hypothyroidism, unspecified: Secondary | ICD-10-CM

## 2019-01-26 DIAGNOSIS — M5412 Radiculopathy, cervical region: Secondary | ICD-10-CM | POA: Diagnosis not present

## 2019-01-27 ENCOUNTER — Encounter: Payer: Self-pay | Admitting: Family Medicine

## 2019-01-29 ENCOUNTER — Other Ambulatory Visit: Payer: Self-pay | Admitting: Neurological Surgery

## 2019-02-18 DIAGNOSIS — M961 Postlaminectomy syndrome, not elsewhere classified: Secondary | ICD-10-CM | POA: Diagnosis not present

## 2019-02-18 DIAGNOSIS — M4722 Other spondylosis with radiculopathy, cervical region: Secondary | ICD-10-CM | POA: Diagnosis not present

## 2019-02-18 DIAGNOSIS — G894 Chronic pain syndrome: Secondary | ICD-10-CM | POA: Diagnosis not present

## 2019-02-18 DIAGNOSIS — Z79891 Long term (current) use of opiate analgesic: Secondary | ICD-10-CM | POA: Diagnosis not present

## 2019-03-20 ENCOUNTER — Other Ambulatory Visit: Payer: Self-pay | Admitting: Family Medicine

## 2019-03-20 DIAGNOSIS — E039 Hypothyroidism, unspecified: Secondary | ICD-10-CM

## 2019-03-29 ENCOUNTER — Other Ambulatory Visit
Admission: RE | Admit: 2019-03-29 | Discharge: 2019-03-29 | Disposition: A | Discharge status: Home / Self Care | Payer: Medicare HMO | Source: Ambulatory Visit | Attending: Neurological Surgery | Admitting: Neurological Surgery

## 2019-03-29 DIAGNOSIS — Z01812 Encounter for preprocedural laboratory examination: Secondary | ICD-10-CM | POA: Diagnosis not present

## 2019-03-29 DIAGNOSIS — Z20828 Contact with and (suspected) exposure to other viral communicable diseases: Secondary | ICD-10-CM | POA: Insufficient documentation

## 2019-03-30 LAB — SARS CORONAVIRUS 2 (TAT 6-24 HRS): SARS Coronavirus 2: NEGATIVE

## 2019-03-30 NOTE — Progress Notes (Signed)
[place], [place] [address] [place] [address] Phone: [phone] Fax: [phone]      Your procedure is scheduled on Friday, December 4th, 2020.   Report to [facility] Main Entrance "A" at 9:15 A.M., and check in at the Admitting office.   Call this number if you have problems the morning of surgery:  [phone]  Call [phone] if you have any questions prior to your surgery date Monday-Friday 8am-4pm    Remember:  Do not eat or drink after midnight the night before your surgery    Take these medicines the morning of surgery with A SIP OF WATER :  Buspirone (Buspar) Escitalopram (Lexapro) Levothyroxine (Synthroid) Morphine (MS Contin) Oxycodone-Acetaminophen (Percocet/Roxicet0 - if needed Pantoprazole (Protonix) Tizanidine (Zanaflex)  7 days prior to surgery STOP taking any Aspirin (unless otherwise instructed by your surgeon), Aleve, Naproxen, Ibuprofen, Motrin, Advil, Goody's, BC's, all herbal medications, fish oil, and all vitamins.    The Morning of Surgery  Do not wear jewelry, make-up or nail polish.  Do not wear lotions, powders, or perfumes/colognes, or deodorant  Do not shave 48 hours prior to surgery.  Men may shave face and neck.  Do not bring valuables to the hospital.  [facility] is not responsible for any belongings or valuables.  If you are a smoker, DO NOT Smoke 24 hours prior to surgery  If you wear a CPAP at night please bring your mask, tubing, and machine the morning of surgery   Remember that you must have someone to transport you home after your surgery, and remain with you for 24 hours if you are discharged the same day.   Please bring cases for contacts, glasses, hearing aids, dentures or bridgework because it cannot be worn into surgery.    Leave your suitcase in the car.  After surgery it may be brought to your room.  For patients admitted to the hospital,  discharge time will be determined by your treatment team.  Patients discharged the day of surgery will not be allowed to drive home.    Special instructions:   [place]- Preparing For Surgery  Before surgery, you can play an important role. Because skin is not sterile, your skin needs to be as free of germs as possible. You can reduce the number of germs on your skin by washing with CHG (chlorahexidine gluconate) Soap before surgery.  CHG is an antiseptic cleaner which kills germs and bonds with the skin to continue killing germs even after washing.    Oral Hygiene is also important to reduce your risk of infection.  Remember - BRUSH YOUR TEETH THE MORNING OF SURGERY WITH YOUR REGULAR TOOTHPASTE  Please do not use if you have an allergy to CHG or antibacterial soaps. If your skin becomes reddened/irritated stop using the CHG.  Do not shave (including legs and underarms) for at least 48 hours prior to first CHG shower. It is OK to shave your face.  Please follow these instructions carefully.   1. Shower the NIGHT BEFORE SURGERY and the MORNING OF SURGERY with CHG Soap.   2. If you chose to wash your hair, wash your hair first as usual with your normal shampoo.  3. After you shampoo, rinse your hair and body thoroughly to remove the shampoo.  4. Use CHG as you would any other liquid soap. You can apply CHG directly to the skin and wash gently with a scrungie or  a clean washcloth.   5. Apply the CHG Soap to your body ONLY FROM THE NECK DOWN.  Do not use on open wounds or open sores. Avoid contact with your eyes, ears, mouth and genitals (private parts). Wash Face and genitals (private parts)  with your normal soap.   6. Wash thoroughly, paying special attention to the area where your surgery will be performed.  7. Thoroughly rinse your body with warm water from the neck down.  8. DO NOT shower/wash with your normal soap after using and rinsing off the CHG Soap.  9. Pat yourself dry  with a CLEAN TOWEL.  10. Wear CLEAN PAJAMAS to bed the night before surgery, wear comfortable clothes the morning of surgery  11. Place CLEAN SHEETS on your bed the night of your first shower and DO NOT SLEEP WITH PETS.    Day of Surgery:  Please shower the morning of surgery with the CHG soap Do not apply any deodorants/lotions. Please wear clean clothes to the hospital/surgery center.   Remember to brush your teeth WITH YOUR REGULAR TOOTHPASTE.   Please read over the following fact sheets that you were given.

## 2019-03-31 ENCOUNTER — Ambulatory Visit (HOSPITAL_COMMUNITY)
Admission: RE | Admit: 2019-03-31 | Discharge: 2019-03-31 | Disposition: A | Discharge status: Home / Self Care | Payer: Medicare HMO | Source: Ambulatory Visit | Attending: Neurological Surgery | Admitting: Neurological Surgery

## 2019-03-31 ENCOUNTER — Encounter (HOSPITAL_COMMUNITY)
Admission: RE | Admit: 2019-03-31 | Discharge: 2019-03-31 | Disposition: A | Discharge status: Home / Self Care | Payer: Medicare HMO | Source: Ambulatory Visit | Attending: Neurological Surgery | Admitting: Neurological Surgery

## 2019-03-31 ENCOUNTER — Other Ambulatory Visit: Payer: Self-pay

## 2019-03-31 ENCOUNTER — Other Ambulatory Visit
Admission: RE | Admit: 2019-03-31 | Discharge: 2019-03-31 | Disposition: A | Discharge status: Home / Self Care | Payer: Medicare HMO | Source: Ambulatory Visit | Attending: Neurological Surgery | Admitting: Neurological Surgery

## 2019-03-31 ENCOUNTER — Inpatient Hospital Stay (HOSPITAL_COMMUNITY): Admission: RE | Admit: 2019-03-31 | Payer: Medicare HMO | Source: Ambulatory Visit

## 2019-03-31 ENCOUNTER — Encounter (HOSPITAL_COMMUNITY): Payer: Self-pay

## 2019-03-31 ENCOUNTER — Inpatient Hospital Stay: Admission: RE | Admit: 2019-03-31 | Payer: Medicare HMO | Source: Ambulatory Visit

## 2019-03-31 DIAGNOSIS — Z20828 Contact with and (suspected) exposure to other viral communicable diseases: Secondary | ICD-10-CM | POA: Diagnosis not present

## 2019-03-31 DIAGNOSIS — M4802 Spinal stenosis, cervical region: Secondary | ICD-10-CM | POA: Insufficient documentation

## 2019-03-31 DIAGNOSIS — Z01812 Encounter for preprocedural laboratory examination: Secondary | ICD-10-CM | POA: Diagnosis not present

## 2019-03-31 DIAGNOSIS — Z01818 Encounter for other preprocedural examination: Secondary | ICD-10-CM | POA: Diagnosis not present

## 2019-03-31 HISTORY — DX: Gastro-esophageal reflux disease without esophagitis: K21.9

## 2019-03-31 HISTORY — DX: Depression, unspecified: F32.A

## 2019-03-31 HISTORY — DX: Essential (primary) hypertension: I10

## 2019-03-31 LAB — PROTIME-INR
INR: 1 (ref 0.8–1.2)
Prothrombin Time: 12.6 seconds (ref 11.4–15.2)

## 2019-03-31 LAB — CBC WITH DIFFERENTIAL/PLATELET
Abs Immature Granulocytes: 0 10*3/uL (ref 0.00–0.07)
Basophils Absolute: 0 10*3/uL (ref 0.0–0.1)
Basophils Relative: 1 %
Eosinophils Absolute: 0.1 10*3/uL (ref 0.0–0.5)
Eosinophils Relative: 2 %
HCT: 40.8 % (ref 36.0–46.0)
Hemoglobin: 13.3 g/dL (ref 12.0–15.0)
Immature Granulocytes: 0 %
Lymphocytes Relative: 36 %
Lymphs Abs: 1.8 10*3/uL (ref 0.7–4.0)
MCH: 29.2 pg (ref 26.0–34.0)
MCHC: 32.6 g/dL (ref 30.0–36.0)
MCV: 89.7 fL (ref 80.0–100.0)
Monocytes Absolute: 0.3 10*3/uL (ref 0.1–1.0)
Monocytes Relative: 6 %
Neutro Abs: 2.7 10*3/uL (ref 1.7–7.7)
Neutrophils Relative %: 55 %
Platelets: 196 10*3/uL (ref 150–400)
RBC: 4.55 MIL/uL (ref 3.87–5.11)
RDW: 12.5 % (ref 11.5–15.5)
WBC: 4.9 10*3/uL (ref 4.0–10.5)
nRBC: 0 % (ref 0.0–0.2)

## 2019-03-31 LAB — SURGICAL PCR SCREEN
MRSA, PCR: NEGATIVE
Staphylococcus aureus: NEGATIVE

## 2019-03-31 LAB — BASIC METABOLIC PANEL
Anion gap: 8 (ref 5–15)
BUN: 21 mg/dL — ABNORMAL HIGH (ref 6–20)
CO2: 28 mmol/L (ref 22–32)
Calcium: 9.5 mg/dL (ref 8.9–10.3)
Chloride: 103 mmol/L (ref 98–111)
Creatinine, Ser: 1.02 mg/dL — ABNORMAL HIGH (ref 0.44–1.00)
GFR calc Af Amer: >60 mL/min (ref >60)
GFR calc non Af Amer: >60 mL/min (ref >60)
Glucose, Bld: 93 mg/dL (ref 70–99)
Potassium: 3.8 mmol/L (ref 3.5–5.1)
Sodium: 139 mmol/L (ref 135–145)

## 2019-03-31 LAB — SARS CORONAVIRUS 2 (TAT 6-24 HRS): SARS Coronavirus 2: NEGATIVE

## 2019-03-31 IMAGING — CR DG CHEST 2V
2 series · 2 of 2 positions shown · non-contrast
Comparison: [DATE]

CLINICAL DATA: Pre-op respiratory exam for cervical spine surgery.

EXAM:
CHEST - 2 VIEW

[w chest pa]
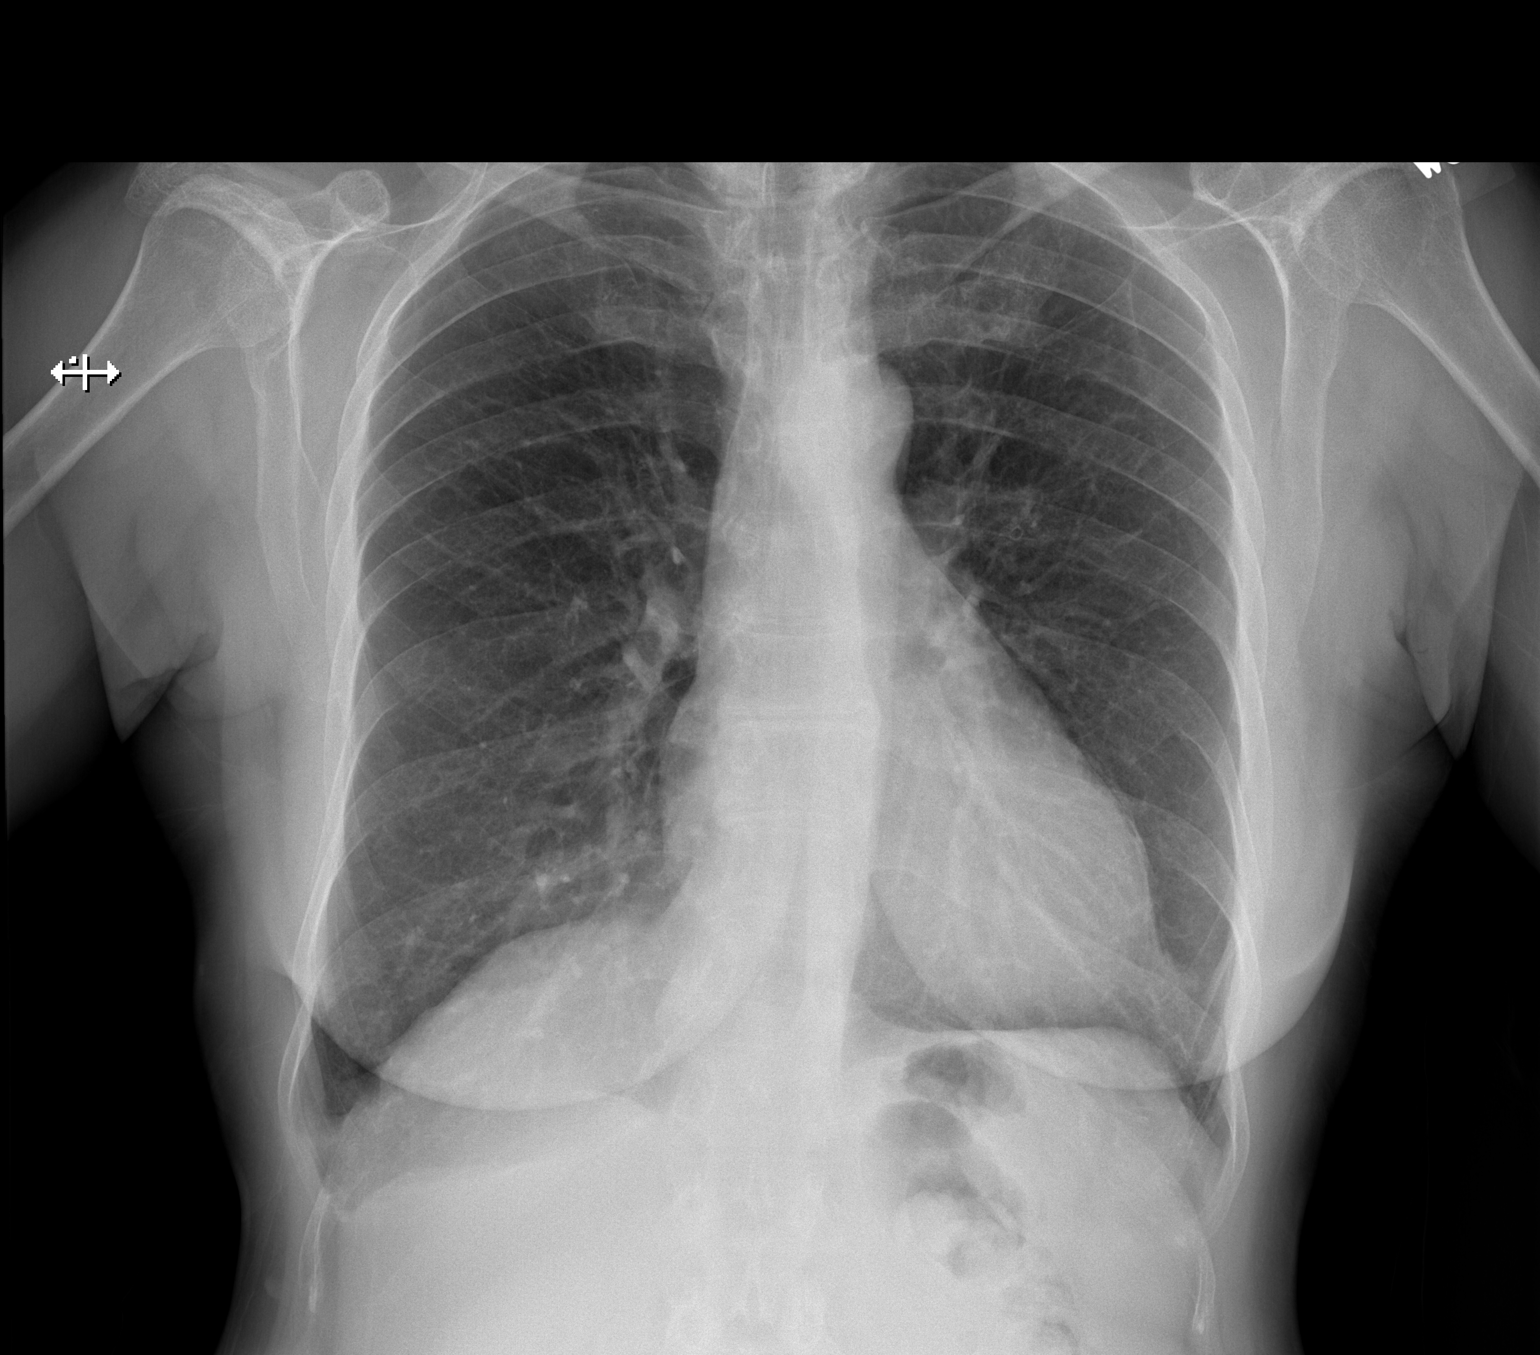

[w chest lat]
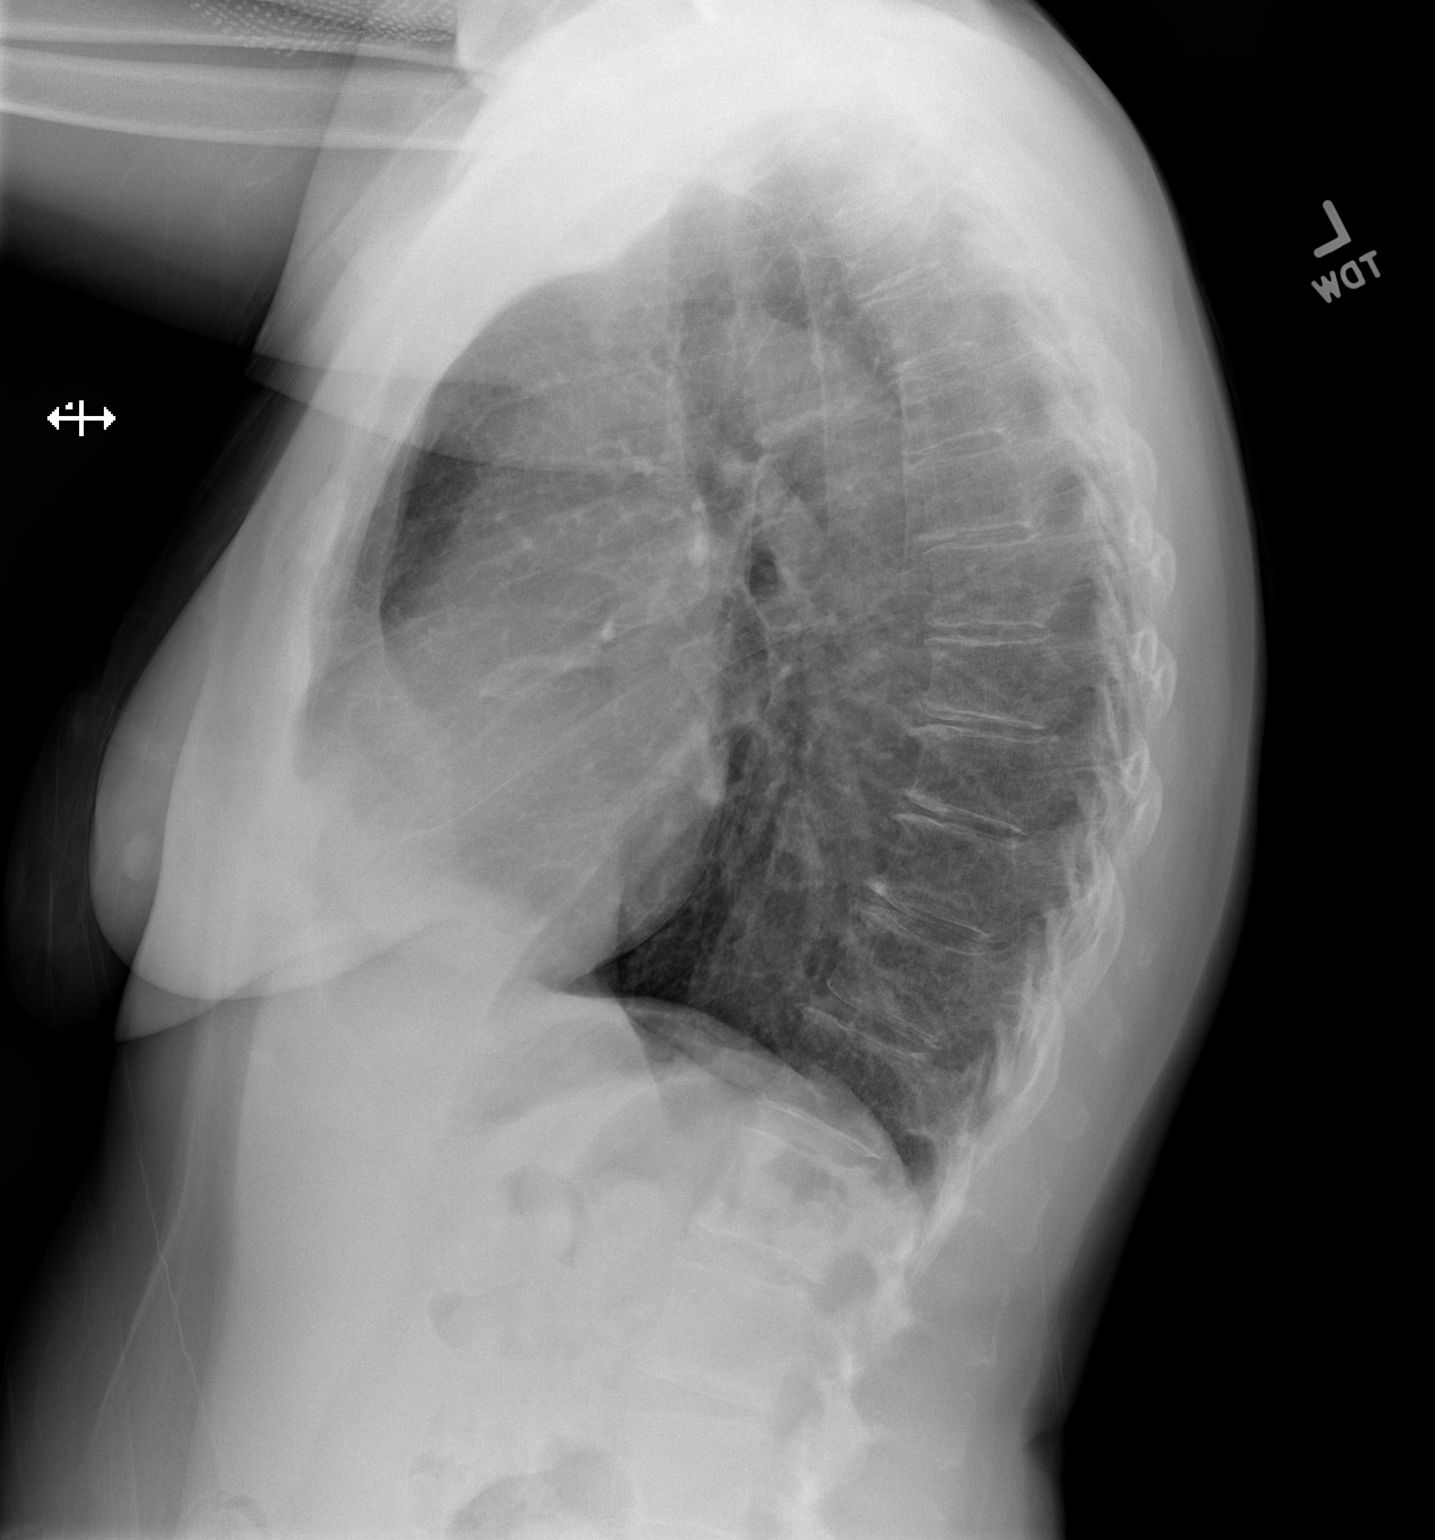

[2 of 2 positions shown; findings below may reference images not displayed]

FINDINGS: The heart size and mediastinal contours are within normal limits.
Both lungs are clear. Cervical spine fusion hardware noted.
IMPRESSION: No active cardiopulmonary disease.

## 2019-03-31 NOTE — Progress Notes (Signed)
PCP - Steele Sizer, MD Cardiologist - denies  Chest x-ray - 03/31/19 EKG - 03/31/19  Stress Test - denies ECHO - denies Cardiac Cath - denies  Blood Thinner Instructions: N/A Aspirin Instructions: N/A  COVID TEST- completed 03/29/19; aware of need for self-quarantine  Anesthesia review: Yes, hx HTN (2016) - patient states not a problem anymore  Patient denies shortness of breath, fever, cough and chest pain at PAT appointment  All instructions explained to the patient, with a verbal understanding of the material. Patient agrees to go over the instructions while at home for a better understanding. Patient also instructed to self quarantine after being tested for COVID-19. The opportunity to ask questions was provided.

## 2019-04-01 ENCOUNTER — Other Ambulatory Visit: Payer: Medicare HMO

## 2019-04-02 ENCOUNTER — Encounter (HOSPITAL_COMMUNITY)
Admission: AD | Disposition: A | Discharge status: Home / Self Care | Payer: Self-pay | Source: Home / Self Care | Attending: Neurological Surgery

## 2019-04-02 ENCOUNTER — Other Ambulatory Visit: Payer: Self-pay

## 2019-04-02 ENCOUNTER — Inpatient Hospital Stay (HOSPITAL_COMMUNITY)
Admission: AD | Admit: 2019-04-02 | Discharge: 2019-04-03 | DRG: 472 | Disposition: A | Discharge status: Home / Self Care | Payer: Medicare HMO | Attending: Neurological Surgery | Admitting: Neurological Surgery

## 2019-04-02 ENCOUNTER — Encounter (HOSPITAL_COMMUNITY): Payer: Self-pay

## 2019-04-02 ENCOUNTER — Ambulatory Visit (HOSPITAL_COMMUNITY): Payer: Medicare HMO | Admitting: Certified Registered Nurse Anesthetist

## 2019-04-02 ENCOUNTER — Ambulatory Visit (HOSPITAL_COMMUNITY): Payer: Medicare HMO

## 2019-04-02 ENCOUNTER — Ambulatory Visit (HOSPITAL_COMMUNITY): Payer: Medicare HMO | Admitting: Physician Assistant

## 2019-04-02 DIAGNOSIS — M50223 Other cervical disc displacement at C6-C7 level: Secondary | ICD-10-CM | POA: Diagnosis not present

## 2019-04-02 DIAGNOSIS — M4802 Spinal stenosis, cervical region: Secondary | ICD-10-CM | POA: Diagnosis not present

## 2019-04-02 DIAGNOSIS — I10 Essential (primary) hypertension: Secondary | ICD-10-CM | POA: Diagnosis present

## 2019-04-02 DIAGNOSIS — E785 Hyperlipidemia, unspecified: Secondary | ICD-10-CM | POA: Diagnosis not present

## 2019-04-02 DIAGNOSIS — Z79899 Other long term (current) drug therapy: Secondary | ICD-10-CM | POA: Diagnosis not present

## 2019-04-02 DIAGNOSIS — K760 Fatty (change of) liver, not elsewhere classified: Secondary | ICD-10-CM | POA: Diagnosis not present

## 2019-04-02 DIAGNOSIS — E039 Hypothyroidism, unspecified: Secondary | ICD-10-CM | POA: Diagnosis present

## 2019-04-02 DIAGNOSIS — Z981 Arthrodesis status: Secondary | ICD-10-CM

## 2019-04-02 DIAGNOSIS — F419 Anxiety disorder, unspecified: Secondary | ICD-10-CM | POA: Diagnosis present

## 2019-04-02 DIAGNOSIS — Z419 Encounter for procedure for purposes other than remedying health state, unspecified: Secondary | ICD-10-CM

## 2019-04-02 DIAGNOSIS — M47812 Spondylosis without myelopathy or radiculopathy, cervical region: Principal | ICD-10-CM | POA: Diagnosis present

## 2019-04-02 DIAGNOSIS — F3181 Bipolar II disorder: Secondary | ICD-10-CM | POA: Diagnosis present

## 2019-04-02 DIAGNOSIS — Z87891 Personal history of nicotine dependence: Secondary | ICD-10-CM | POA: Diagnosis not present

## 2019-04-02 DIAGNOSIS — K219 Gastro-esophageal reflux disease without esophagitis: Secondary | ICD-10-CM | POA: Diagnosis not present

## 2019-04-02 DIAGNOSIS — Z818 Family history of other mental and behavioral disorders: Secondary | ICD-10-CM

## 2019-04-02 DIAGNOSIS — G8929 Other chronic pain: Secondary | ICD-10-CM | POA: Diagnosis present

## 2019-04-02 DIAGNOSIS — Z79890 Hormone replacement therapy (postmenopausal): Secondary | ICD-10-CM | POA: Diagnosis not present

## 2019-04-02 DIAGNOSIS — M2578 Osteophyte, vertebrae: Secondary | ICD-10-CM | POA: Diagnosis present

## 2019-04-02 DIAGNOSIS — M4722 Other spondylosis with radiculopathy, cervical region: Secondary | ICD-10-CM | POA: Diagnosis not present

## 2019-04-02 DIAGNOSIS — Z803 Family history of malignant neoplasm of breast: Secondary | ICD-10-CM

## 2019-04-02 DIAGNOSIS — R69 Illness, unspecified: Secondary | ICD-10-CM | POA: Diagnosis not present

## 2019-04-02 DIAGNOSIS — M79601 Pain in right arm: Secondary | ICD-10-CM | POA: Diagnosis present

## 2019-04-02 DIAGNOSIS — M4322 Fusion of spine, cervical region: Secondary | ICD-10-CM | POA: Diagnosis not present

## 2019-04-02 HISTORY — PX: ANTERIOR CERVICAL DECOMP/DISCECTOMY FUSION: SHX1161

## 2019-04-02 IMAGING — RF DG CERVICAL SPINE 2 OR 3 VIEWS
1 series · 3 of 3 positions shown · non-contrast
Comparison: CT cervical spine [DATE]

FLUOROSCOPY TIME:  0 minutes 12 seconds

CLINICAL DATA: Anterior cervical fusion C6-C7

EXAM:
CERVICAL SPINE - 2-3 VIEW; DG C-ARM 1-60 MIN

[Series 1: run · 3 of 3 slices shown]
[im 1/3]
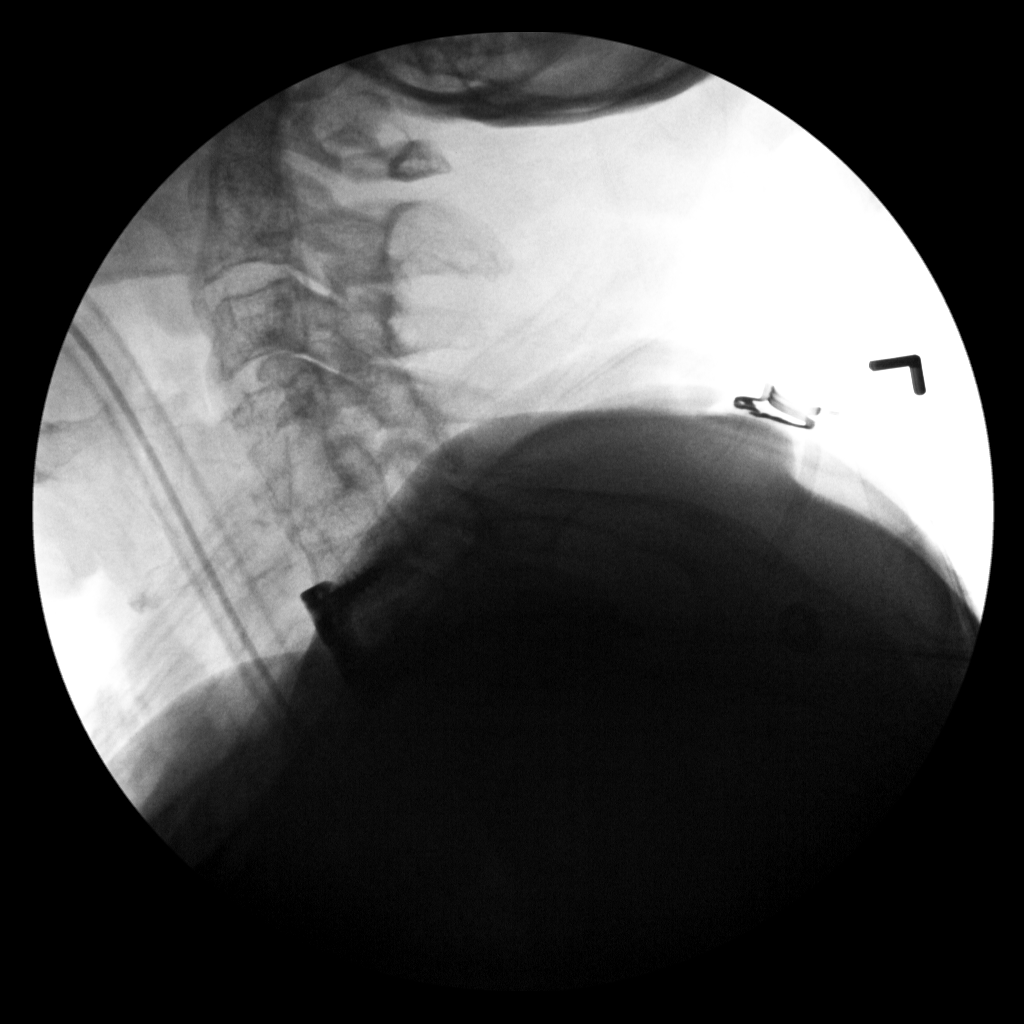
[im 2/3]
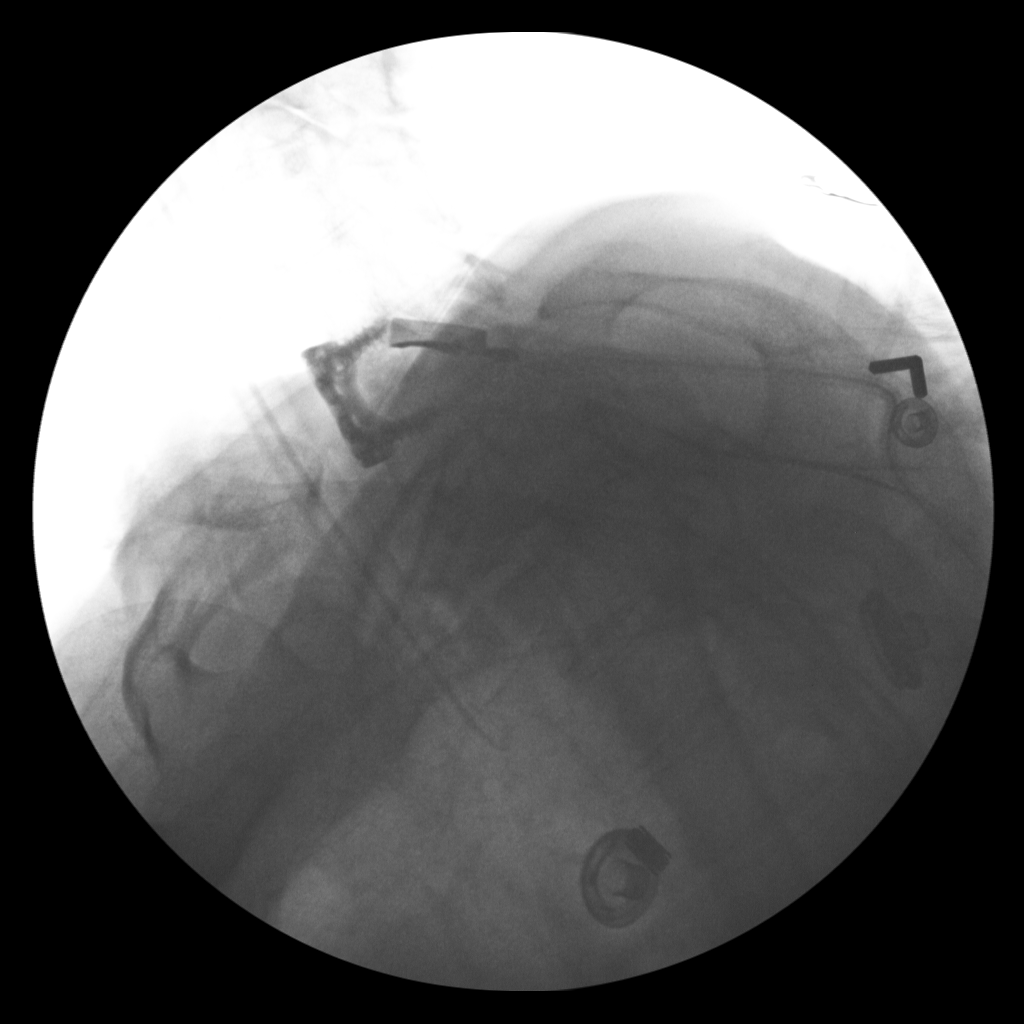
[im 3/3]
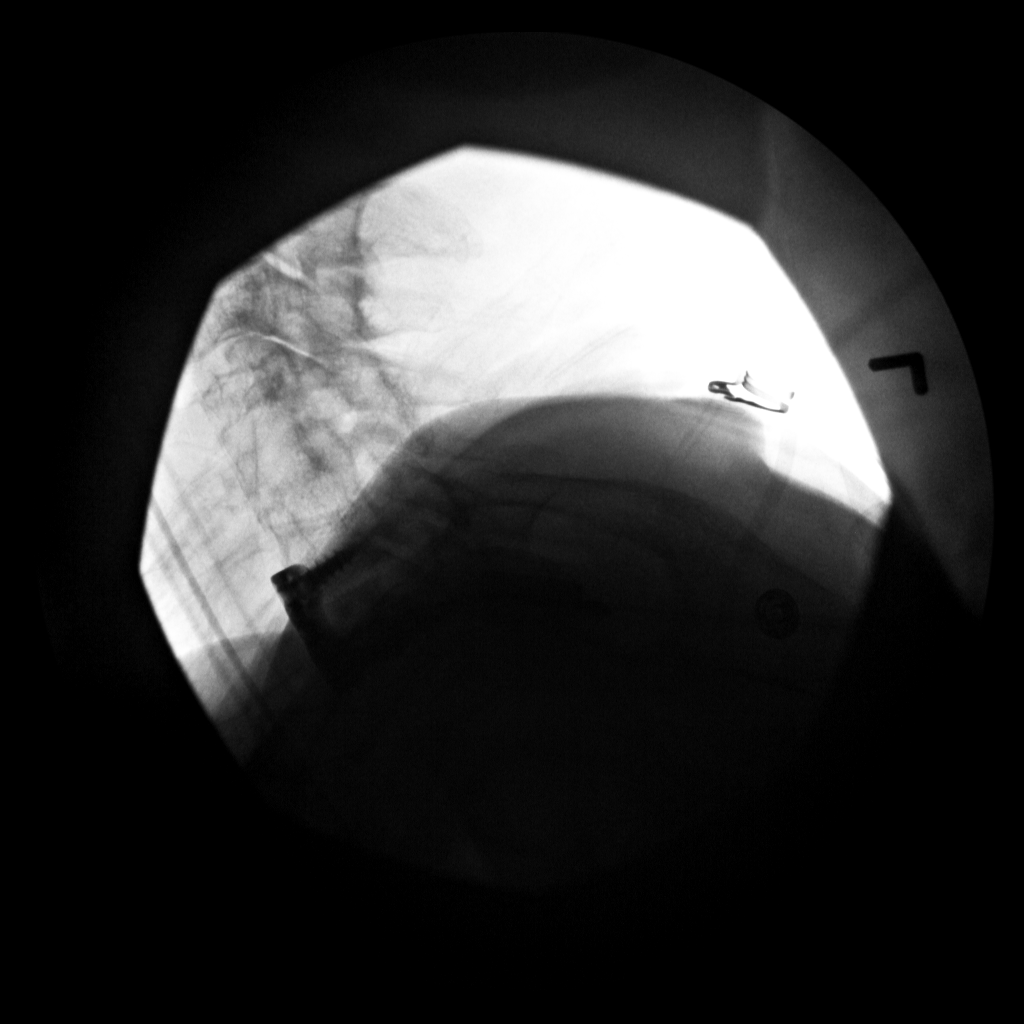

[3 of 3 positions shown; findings below may reference images not displayed]

FINDINGS: Three submitted images.

Osseous demineralization.

Prior fusion of C4-C6.

Anterior plate at C4-C6 removed.

New anterior plate identified C6-C7.

Suboptimal assessment of alignment due to superimposition of the
shoulders.
IMPRESSION: Removal of C4-C6 plate.

Anterior fusion C6-C7.

## 2019-04-02 IMAGING — RF DG C-ARM 1-60 MIN
1 series · 3 of 3 positions shown · non-contrast
Comparison: CT cervical spine [DATE]

FLUOROSCOPY TIME:  0 minutes 12 seconds

CLINICAL DATA: Anterior cervical fusion C6-C7

EXAM:
CERVICAL SPINE - 2-3 VIEW; DG C-ARM 1-60 MIN

[Series 1: run · 3 of 3 slices shown]
[im 1/3]
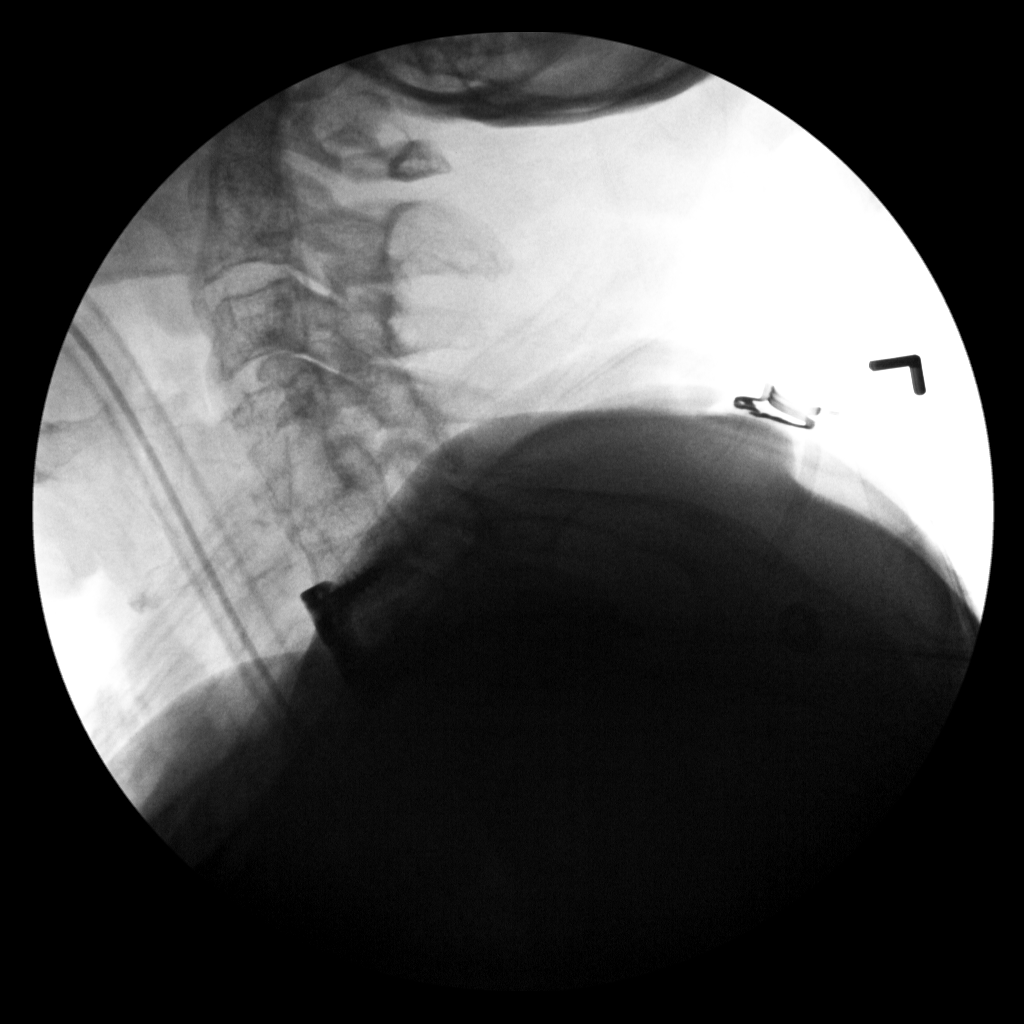
[im 2/3]
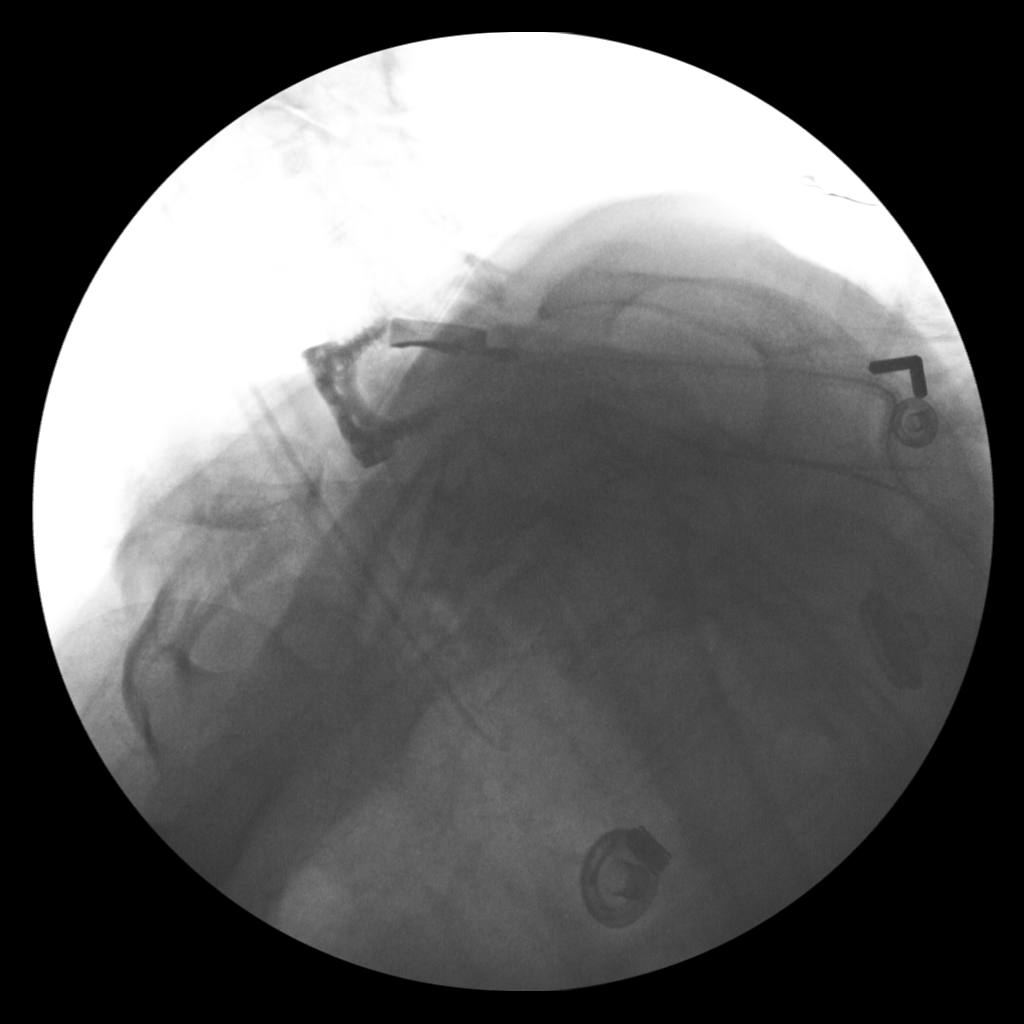
[im 3/3]
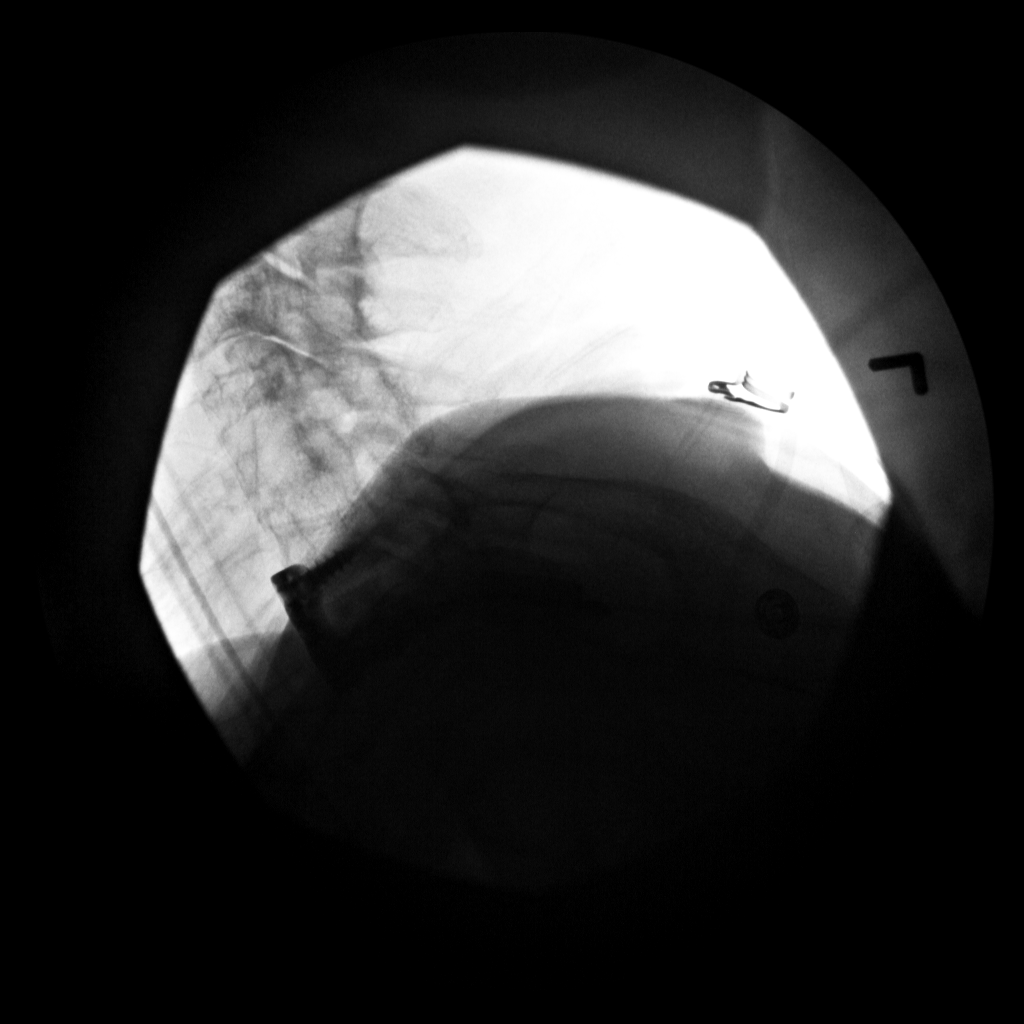

[3 of 3 positions shown; findings below may reference images not displayed]

FINDINGS: Three submitted images.

Osseous demineralization.

Prior fusion of C4-C6.

Anterior plate at C4-C6 removed.

New anterior plate identified C6-C7.

Suboptimal assessment of alignment due to superimposition of the
shoulders.
IMPRESSION: Removal of C4-C6 plate.

Anterior fusion C6-C7.

## 2019-04-02 SURGERY — ANTERIOR CERVICAL DECOMPRESSION/DISCECTOMY FUSION 1 LEVEL/HARDWARE REMOVAL
Anesthesia: General | Site: Spine Cervical

## 2019-04-02 MED ORDER — EPHEDRINE SULFATE 50 MG/ML IJ SOLN
INTRAMUSCULAR | Status: DC | PRN
  Administered 2019-04-02: 10 mg via INTRAVENOUS

## 2019-04-02 MED ORDER — SENNA 8.6 MG PO TABS
1.0000 | ORAL_TABLET | Freq: Two times a day (BID) | ORAL | Status: DC
  Administered 2019-04-02: 8.6 mg via ORAL

## 2019-04-02 MED ORDER — SODIUM CHLORIDE 0.9 % IV SOLN
250.0000 mL | INTRAVENOUS | Status: DC
  Administered 2019-04-02: 250 mL via INTRAVENOUS

## 2019-04-02 MED ORDER — PROMETHAZINE HCL 25 MG/ML IJ SOLN: 6.2500 mg | INTRAMUSCULAR | Status: DC | PRN

## 2019-04-02 MED ORDER — THROMBIN 5000 UNITS EX SOLR
CUTANEOUS | Status: AC
  Filled 2019-04-02: qty 5000

## 2019-04-02 MED ORDER — BUSPIRONE HCL 10 MG PO TABS
10.0000 mg | ORAL_TABLET | Freq: Every day | ORAL | Status: DC
  Filled 2019-04-02: qty 1

## 2019-04-02 MED ORDER — LACTATED RINGERS IV SOLN
INTRAVENOUS | Status: DC
  Administered 2019-04-02 (×2): via INTRAVENOUS

## 2019-04-02 MED ORDER — ACETAMINOPHEN 500 MG PO TABS
1000.0000 mg | ORAL_TABLET | Freq: Once | ORAL | Status: AC
  Administered 2019-04-02: 09:00:00 500 mg via ORAL

## 2019-04-02 MED ORDER — SUGAMMADEX SODIUM 200 MG/2ML IV SOLN
INTRAVENOUS | Status: DC | PRN
  Administered 2019-04-02: 200 mg via INTRAVENOUS

## 2019-04-02 MED ORDER — POTASSIUM CHLORIDE IN NACL 20-0.9 MEQ/L-% IV SOLN: INTRAVENOUS | Status: DC

## 2019-04-02 MED ORDER — 0.9 % SODIUM CHLORIDE (POUR BTL) OPTIME
TOPICAL | Status: DC | PRN
  Administered 2019-04-02: 13:00:00 1000 mL

## 2019-04-02 MED ORDER — OXYCODONE HCL 5 MG PO TABS
10.0000 mg | ORAL_TABLET | ORAL | Status: DC | PRN
  Administered 2019-04-02 (×2): 10 mg via ORAL
  Filled 2019-04-02 (×2): qty 2

## 2019-04-02 MED ORDER — ONDANSETRON HCL 4 MG PO TABS: 4.0000 mg | ORAL_TABLET | Freq: Four times a day (QID) | ORAL | Status: DC | PRN

## 2019-04-02 MED ORDER — HYDROMORPHONE HCL 1 MG/ML IJ SOLN
INTRAMUSCULAR | Status: AC
  Filled 2019-04-02: qty 2

## 2019-04-02 MED ORDER — PANTOPRAZOLE SODIUM 40 MG PO TBEC: 40.0000 mg | DELAYED_RELEASE_TABLET | Freq: Every day | ORAL | Status: DC

## 2019-04-02 MED ORDER — MIDAZOLAM HCL 2 MG/2ML IJ SOLN
INTRAMUSCULAR | Status: AC
  Filled 2019-04-02: qty 2

## 2019-04-02 MED ORDER — MORPHINE SULFATE (PF) 2 MG/ML IV SOLN: 2.0000 mg | INTRAVENOUS | Status: DC | PRN

## 2019-04-02 MED ORDER — DEXAMETHASONE SODIUM PHOSPHATE 10 MG/ML IJ SOLN
10.0000 mg | Freq: Once | INTRAMUSCULAR | Status: AC
  Administered 2019-04-02: 12:00:00 10 mg via INTRAVENOUS

## 2019-04-02 MED ORDER — CEFAZOLIN SODIUM-DEXTROSE 2-4 GM/100ML-% IV SOLN
INTRAVENOUS | Status: AC
  Filled 2019-04-02: qty 100

## 2019-04-02 MED ORDER — DEXAMETHASONE SODIUM PHOSPHATE 10 MG/ML IJ SOLN
INTRAMUSCULAR | Status: AC
  Filled 2019-04-02: qty 1

## 2019-04-02 MED ORDER — METHOCARBAMOL 1000 MG/10ML IJ SOLN
500.0000 mg | Freq: Four times a day (QID) | INTRAVENOUS | Status: DC | PRN
  Administered 2019-04-02: 500 mg via INTRAVENOUS
  Filled 2019-04-02: qty 5

## 2019-04-02 MED ORDER — CHLORHEXIDINE GLUCONATE CLOTH 2 % EX PADS: 6.0000 | MEDICATED_PAD | Freq: Once | CUTANEOUS | Status: DC

## 2019-04-02 MED ORDER — DEXAMETHASONE SODIUM PHOSPHATE 4 MG/ML IJ SOLN: 4.0000 mg | Freq: Four times a day (QID) | INTRAMUSCULAR | Status: DC

## 2019-04-02 MED ORDER — MIDAZOLAM HCL 2 MG/2ML IJ SOLN
INTRAMUSCULAR | Status: DC | PRN
  Administered 2019-04-02: 2 mg via INTRAVENOUS

## 2019-04-02 MED ORDER — KETAMINE HCL 50 MG/5ML IJ SOSY
PREFILLED_SYRINGE | INTRAMUSCULAR | Status: AC
  Filled 2019-04-02: qty 5

## 2019-04-02 MED ORDER — FENTANYL CITRATE (PF) 250 MCG/5ML IJ SOLN
INTRAMUSCULAR | Status: AC
  Filled 2019-04-02: qty 5

## 2019-04-02 MED ORDER — MENTHOL 3 MG MT LOZG: 1.0000 | LOZENGE | OROMUCOSAL | Status: DC | PRN

## 2019-04-02 MED ORDER — SODIUM CHLORIDE 0.9% FLUSH: 3.0000 mL | INTRAVENOUS | Status: DC | PRN

## 2019-04-02 MED ORDER — FENTANYL CITRATE (PF) 100 MCG/2ML IJ SOLN
INTRAMUSCULAR | Status: DC | PRN
  Administered 2019-04-02: 150 ug via INTRAVENOUS
  Administered 2019-04-02 (×2): 50 ug via INTRAVENOUS

## 2019-04-02 MED ORDER — THROMBIN 5000 UNITS EX SOLR
CUTANEOUS | Status: AC
  Filled 2019-04-02: qty 10000

## 2019-04-02 MED ORDER — SODIUM CHLORIDE 0.9 % IV SOLN
INTRAVENOUS | Status: DC | PRN
  Administered 2019-04-02: 500 mL

## 2019-04-02 MED ORDER — BUPIVACAINE HCL (PF) 0.25 % IJ SOLN
INTRAMUSCULAR | Status: AC
  Filled 2019-04-02: qty 30

## 2019-04-02 MED ORDER — OXYCODONE HCL 5 MG PO TABS: 5.0000 mg | ORAL_TABLET | Freq: Once | ORAL | Status: DC | PRN

## 2019-04-02 MED ORDER — LIDOCAINE HCL (CARDIAC) PF 100 MG/5ML IV SOSY
PREFILLED_SYRINGE | INTRAVENOUS | Status: DC | PRN
  Administered 2019-04-02: 50 mg via INTRAVENOUS
  Administered 2019-04-02: 60 mg via INTRAVENOUS

## 2019-04-02 MED ORDER — HEMOSTATIC AGENTS (NO CHARGE) OPTIME
TOPICAL | Status: DC | PRN
  Administered 2019-04-02: 1 via TOPICAL

## 2019-04-02 MED ORDER — ONDANSETRON HCL 4 MG/2ML IJ SOLN
INTRAMUSCULAR | Status: DC | PRN
  Administered 2019-04-02: 4 mg via INTRAVENOUS

## 2019-04-02 MED ORDER — LEVOTHYROXINE SODIUM 88 MCG PO TABS
88.0000 ug | ORAL_TABLET | Freq: Every day | ORAL | Status: DC
  Filled 2019-04-02: qty 1

## 2019-04-02 MED ORDER — KETAMINE HCL 10 MG/ML IJ SOLN
INTRAMUSCULAR | Status: DC | PRN
  Administered 2019-04-02 (×2): 10 mg via INTRAVENOUS

## 2019-04-02 MED ORDER — ONDANSETRON HCL 4 MG/2ML IJ SOLN
4.0000 mg | Freq: Four times a day (QID) | INTRAMUSCULAR | Status: DC | PRN
  Administered 2019-04-02: 4 mg via INTRAVENOUS
  Filled 2019-04-02: qty 2

## 2019-04-02 MED ORDER — HYDROMORPHONE HCL 1 MG/ML IJ SOLN
0.2500 mg | INTRAMUSCULAR | Status: DC | PRN
  Administered 2019-04-02 (×4): 0.5 mg via INTRAVENOUS

## 2019-04-02 MED ORDER — THROMBIN 5000 UNITS EX SOLR
CUTANEOUS | Status: DC | PRN
  Administered 2019-04-02 (×2): 5000 [IU] via TOPICAL

## 2019-04-02 MED ORDER — ROCURONIUM BROMIDE 100 MG/10ML IV SOLN
INTRAVENOUS | Status: DC | PRN
  Administered 2019-04-02: 50 mg via INTRAVENOUS
  Administered 2019-04-02: 30 mg via INTRAVENOUS

## 2019-04-02 MED ORDER — PHENOL 1.4 % MT LIQD: 1.0000 | OROMUCOSAL | Status: DC | PRN

## 2019-04-02 MED ORDER — OXYCODONE HCL 5 MG/5ML PO SOLN: 5.0000 mg | Freq: Once | ORAL | Status: DC | PRN

## 2019-04-02 MED ORDER — SODIUM CHLORIDE 0.9% FLUSH
3.0000 mL | Freq: Two times a day (BID) | INTRAVENOUS | Status: DC
  Administered 2019-04-02 (×2): 3 mL via INTRAVENOUS

## 2019-04-02 MED ORDER — PROPOFOL 10 MG/ML IV BOLUS
INTRAVENOUS | Status: DC | PRN
  Administered 2019-04-02: 150 mg via INTRAVENOUS
  Administered 2019-04-02: 50 mg via INTRAVENOUS

## 2019-04-02 MED ORDER — THROMBIN 5000 UNITS EX SOLR
OROMUCOSAL | Status: DC | PRN
  Administered 2019-04-02: 5 mL via TOPICAL

## 2019-04-02 MED ORDER — CEFAZOLIN SODIUM-DEXTROSE 2-4 GM/100ML-% IV SOLN
2.0000 g | Freq: Three times a day (TID) | INTRAVENOUS | Status: AC
  Administered 2019-04-02 (×2): 2 g via INTRAVENOUS
  Filled 2019-04-02 (×2): qty 100

## 2019-04-02 MED ORDER — DEXAMETHASONE 4 MG PO TABS
4.0000 mg | ORAL_TABLET | Freq: Four times a day (QID) | ORAL | Status: DC
  Administered 2019-04-02 – 2019-04-03 (×3): 4 mg via ORAL
  Filled 2019-04-02 (×3): qty 1

## 2019-04-02 MED ORDER — CEFAZOLIN SODIUM-DEXTROSE 2-4 GM/100ML-% IV SOLN: 2.0000 g | INTRAVENOUS | Status: DC

## 2019-04-02 MED ORDER — BUPIVACAINE HCL (PF) 0.25 % IJ SOLN
INTRAMUSCULAR | Status: DC | PRN
  Administered 2019-04-02: 5 mL

## 2019-04-02 MED ORDER — METHOCARBAMOL 500 MG PO TABS
500.0000 mg | ORAL_TABLET | Freq: Four times a day (QID) | ORAL | Status: DC | PRN
  Filled 2019-04-02 (×2): qty 1

## 2019-04-02 MED ORDER — PROPOFOL 10 MG/ML IV BOLUS
INTRAVENOUS | Status: AC
  Filled 2019-04-02: qty 20

## 2019-04-02 MED ORDER — ACETAMINOPHEN 325 MG PO TABS: 650.0000 mg | ORAL_TABLET | ORAL | Status: DC | PRN

## 2019-04-02 MED ORDER — ACETAMINOPHEN 500 MG PO TABS
ORAL_TABLET | ORAL | Status: AC
  Administered 2019-04-02: 500 mg via ORAL
  Filled 2019-04-02: qty 1

## 2019-04-02 MED ORDER — ACETAMINOPHEN 650 MG RE SUPP: 650.0000 mg | RECTAL | Status: DC | PRN

## 2019-04-02 SURGICAL SUPPLY — 61 items
APL SKNCLS STERI-STRIP NONHPOA (GAUZE/BANDAGES/DRESSINGS) ×1
BAG DECANTER FOR FLEXI CONT (MISCELLANEOUS) ×3 IMPLANT
BASKET BONE COLLECTION (BASKET) ×2 IMPLANT
BENZOIN TINCTURE PRP APPL 2/3 (GAUZE/BANDAGES/DRESSINGS) ×3 IMPLANT
BIT DRILL TRINICA 2.3MM (BIT) IMPLANT
BUR MATCHSTICK NEURO 3.0 LAGG (BURR) ×3 IMPLANT
CANISTER SUCT 3000ML PPV (MISCELLANEOUS) ×3 IMPLANT
CARTRIDGE OIL MAESTRO DRILL (MISCELLANEOUS) ×1 IMPLANT
CLOSURE WOUND 1/2 X4 (GAUZE/BANDAGES/DRESSINGS) ×1
COVER WAND RF STERILE (DRAPES) ×1 IMPLANT
DIFFUSER DRILL AIR PNEUMATIC (MISCELLANEOUS) ×3 IMPLANT
DRAPE C-ARM 42X72 X-RAY (DRAPES) ×6 IMPLANT
DRAPE LAPAROTOMY 100X72 PEDS (DRAPES) ×3 IMPLANT
DRAPE MICROSCOPE LEICA (MISCELLANEOUS) ×3 IMPLANT
DRILL BIT TRINICA 2.3MM (BIT) ×3
DRSG OPSITE POSTOP 3X4 (GAUZE/BANDAGES/DRESSINGS) ×2 IMPLANT
DURAPREP 6ML APPLICATOR 50/CS (WOUND CARE) ×3 IMPLANT
ELECT COATED BLADE 2.86 ST (ELECTRODE) ×3 IMPLANT
ELECT REM PT RETURN 9FT ADLT (ELECTROSURGICAL) ×3
ELECTRODE REM PT RTRN 9FT ADLT (ELECTROSURGICAL) ×1 IMPLANT
GAUZE 4X4 16PLY RFD (DISPOSABLE) IMPLANT
GLOVE BIO SURGEON STRL SZ7 (GLOVE) ×2 IMPLANT
GLOVE BIO SURGEON STRL SZ8 (GLOVE) ×3 IMPLANT
GLOVE BIOGEL PI IND STRL 6.5 (GLOVE) IMPLANT
GLOVE BIOGEL PI IND STRL 7.0 (GLOVE) IMPLANT
GLOVE BIOGEL PI INDICATOR 6.5 (GLOVE) ×2
GLOVE BIOGEL PI INDICATOR 7.0 (GLOVE) ×2
GLOVE SURG SS PI 6.0 STRL IVOR (GLOVE) ×2 IMPLANT
GLOVE SURG SS PI 7.5 STRL IVOR (GLOVE) ×6 IMPLANT
GOWN STRL REUS W/ TWL LRG LVL3 (GOWN DISPOSABLE) IMPLANT
GOWN STRL REUS W/ TWL XL LVL3 (GOWN DISPOSABLE) IMPLANT
GOWN STRL REUS W/TWL 2XL LVL3 (GOWN DISPOSABLE) ×3 IMPLANT
GOWN STRL REUS W/TWL LRG LVL3 (GOWN DISPOSABLE) ×9
GOWN STRL REUS W/TWL XL LVL3 (GOWN DISPOSABLE) ×3
HALTER HD/CHIN CERV TRACTION D (MISCELLANEOUS) IMPLANT
HEMOSTAT POWDER KIT SURGIFOAM (HEMOSTASIS) ×3 IMPLANT
KIT BASIN OR (CUSTOM PROCEDURE TRAY) ×3 IMPLANT
KIT TURNOVER KIT B (KITS) ×3 IMPLANT
NDL HYPO 25X1 1.5 SAFETY (NEEDLE) ×1 IMPLANT
NDL SPNL 20GX3.5 QUINCKE YW (NEEDLE) ×1 IMPLANT
NEEDLE HYPO 25X1 1.5 SAFETY (NEEDLE) ×3 IMPLANT
NEEDLE SPNL 20GX3.5 QUINCKE YW (NEEDLE) ×3 IMPLANT
NS IRRIG 1000ML POUR BTL (IV SOLUTION) ×3 IMPLANT
OIL CARTRIDGE MAESTRO DRILL (MISCELLANEOUS) ×3
PACK LAMINECTOMY NEURO (CUSTOM PROCEDURE TRAY) ×3 IMPLANT
PAD ARMBOARD 7.5X6 YLW CONV (MISCELLANEOUS) ×9 IMPLANT
PIN DISTRACTION 14MM (PIN) ×6 IMPLANT
PLATE 24MM (Plate) ×2 IMPLANT
RUBBERBAND STERILE (MISCELLANEOUS) ×6 IMPLANT
SCREW 12MM (Screw) ×6 IMPLANT
SCREW BN 12X4.2XST VA NS (Screw) IMPLANT
SCREW SELF DRILL VAR 12MM (Screw) ×4 IMPLANT
SPACER ASSEM CERV LORD 7M (Spacer) ×2 IMPLANT
SPONGE INTESTINAL PEANUT (DISPOSABLE) ×3 IMPLANT
STRIP CLOSURE SKIN 1/2X4 (GAUZE/BANDAGES/DRESSINGS) ×2 IMPLANT
SUT VIC AB 3-0 SH 8-18 (SUTURE) ×5 IMPLANT
SUT VICRYL 4-0 PS2 18IN ABS (SUTURE) ×2 IMPLANT
SYR BULB 3OZ (MISCELLANEOUS) ×2 IMPLANT
TOWEL GREEN STERILE (TOWEL DISPOSABLE) ×3 IMPLANT
TOWEL GREEN STERILE FF (TOWEL DISPOSABLE) ×3 IMPLANT
WATER STERILE IRR 1000ML POUR (IV SOLUTION) ×3 IMPLANT

## 2019-04-02 NOTE — Anesthesia Procedure Notes (Addendum)
Procedure Name: Intubation Date/Time: 04/02/2019 11:24 AM Performed by: Valda Favia, CRNA Pre-anesthesia Checklist: Patient identified, Emergency Drugs available, Suction available and Patient being monitored Patient Re-evaluated:Patient Re-evaluated prior to induction Oxygen Delivery Method: Circle System Utilized Preoxygenation: Pre-oxygenation with 100% oxygen Induction Type: IV induction Ventilation: Mask ventilation without difficulty Laryngoscope Size: Glidescope and 3 Grade View: Grade I Tube type: Oral Number of attempts: 1 Airway Equipment and Method: Stylet Placement Confirmation: ETT inserted through vocal cords under direct vision,  positive ETCO2 and breath sounds checked- equal and bilateral Secured at: 22 cm Tube secured with: Tape Dental Injury: Teeth and Oropharynx as per pre-operative assessment

## 2019-04-02 NOTE — H&P (Signed)
Subjective:   Patient is a 58 y.o. female admitted for acdf. The patient first presented to me with complaints of neck pain, arm pain and numbness of the arm(s). Onset of symptoms was several months ago. The pain is described as aching and occurs all day. The pain is rated severe, and is located in the neck and radiates to the arms. The symptoms have been progressive. Symptoms are exacerbated by extending head backwards, and are relieved by none.  Previous work up includes MRI of cervical spine, results: spinal stenosis and CT of cervical spine, results: solid fusion C4-6, spondylosis C6-7.  Past Medical History:  Diagnosis Date  . Abnormal exam/test finding   . Anxiety   . Bilateral shoulder pain   . Bipolar disorder ([place])   . Bipolar II disorder ([place])   . Chronic neck pain   . Chronic pain of both shoulders   . Depression   . Encounter to establish care   . Fatty liver disease, nonalcoholic   . Gastritis   . GERD (gastroesophageal reflux disease)   . Hypertension   . Hypothyroidism   . Hypothyroidism, adult   . Screening for depression   . Transaminitis     Past Surgical History:  Procedure Laterality Date  . bladder sugery     Age 77 or 6  . NECK SURGERY  10/01/2005    No Known Allergies  Social History   Tobacco Use  . Smoking status: Former Smoker    Packs/day: 1.00    Years: 40.00    Pack years: 40.00    Types: Cigarettes    Quit date: 07/05/2018    Years since quitting: 0.7  . Smokeless tobacco: Never Used  Substance Use Topics  . Alcohol use: Not Currently    Alcohol/week: 0.0 standard drinks    Comment: used to drink heavily , quit in 2007     Family History  Problem Relation Age of Onset  . Depression Mother   . Ulcers Sister   . Breast cancer Paternal Grandmother    Prior to Admission medications   Medication Sig Start Date End Date Taking? Authorizing Provider  busPIRone (BUSPAR) 10 MG tablet Take 1 tablet (10 mg total) by mouth 2 (two) times  daily. Patient taking differently: Take 10 mg by mouth daily.  12/28/18  Yes Sowles, Drue Stager, MD  Cholecalciferol (VITAMIN D3 PO) Take 1 tablet by mouth daily.   Yes [provider]  escitalopram (LEXAPRO) 10 MG tablet Take 15 mg daily Patient taking differently: Take 15 mg by mouth daily.  12/28/18  Yes Sowles, Drue Stager, MD  levothyroxine (SYNTHROID) 88 MCG tablet TAKE ONE TABLET BY MOUTH EVERY MORNING 30 MINUTES BEFORE BREAKFAST Patient taking differently: Take 88 mcg by mouth daily before breakfast. 30 MINUTES BEFORE BREAKFAST 03/20/19  Yes Sowles, Drue Stager, MD  morphine (MS CONTIN) 60 MG 12 hr tablet Take 60 mg by mouth 3 (three) times daily. *scheduled*   Yes [provider]  oxyCODONE-acetaminophen (PERCOCET/ROXICET) 5-325 MG per tablet Take 1 tablet by mouth 3 (three) times daily. *scheduled*   Yes [provider]  pantoprazole (PROTONIX) 40 MG tablet Take 1 tablet (40 mg total) by mouth daily. Patient taking differently: Take 40 mg by mouth daily before supper.  12/28/18  Yes Sowles, Drue Stager, MD  polyethylene glycol [facility]) packet Take 17 g by mouth daily as needed (constipation).    Yes [provider]  QUEtiapine (SEROQUEL) 25 MG tablet Take 1 tablet (25 mg total) by mouth at  bedtime. 12/28/18 12/28/19 Yes Sowles, Drue Stager, MD  tiZANidine (ZANAFLEX) 4 MG tablet Take 6 mg by mouth 3 (three) times daily. *scheduled*   Yes [provider]     Review of Systems  Positive ROS: neg  All other systems have been reviewed and were otherwise negative with the exception of those mentioned in the HPI and as above.  Objective: Vital signs in last 24 hours: Temp:  [98 F (36.7 C)] 98 F (36.7 C) (12/04 0855) Pulse Rate:  [65] 65 (12/04 0855) Resp:  [18] 18 (12/04 0855) BP: (112)/(74) 112/74 (12/04 0855) SpO2:  [100 %] 100 % (12/04 0855)  General Appearance: Alert, cooperative, no distress, appears stated age Head: Normocephalic, without obvious  abnormality, atraumatic Eyes: PERRL, conjunctiva/corneas clear, EOM's intact      Neck: Supple, symmetrical, trachea midline, Back: Symmetric, no curvature, ROM normal, no CVA tenderness Lungs:  respirations unlabored Heart: Regular rate and rhythm Abdomen: Soft, non-tender Extremities: Extremities normal, atraumatic, no cyanosis or edema Pulses: 2+ and symmetric all extremities Skin: Skin color, texture, turgor normal, no rashes or lesions  NEUROLOGIC:  Mental status: Alert and oriented x4, no aphasia, good attention span, fund of knowledge and memory  Motor Exam - grossly normal Sensory Exam - grossly normal Reflexes: 1+ Coordination - grossly normal Gait - grossly normal Balance - grossly normal Cranial Nerves: I: smell Not tested  II: visual acuity  OS: nl    OD: nl  II: visual fields Full to confrontation  II: pupils Equal, round, reactive to light  III,VII: ptosis None  III,IV,VI: extraocular muscles  Full ROM  V: mastication Normal  V: facial light touch sensation  Normal  V,VII: corneal reflex  Present  VII: facial muscle function - upper  Normal  VII: facial muscle function - lower Normal  VIII: hearing Not tested  IX: soft palate elevation  Normal  IX,X: gag reflex Present  XI: trapezius strength  5/5  XI: sternocleidomastoid strength 5/5  XI: neck flexion strength  5/5  XII: tongue strength  Normal    Data Review Lab Results  Component Value Date   WBC 4.9 03/31/2019   HGB 13.3 03/31/2019   HCT 40.8 03/31/2019   MCV 89.7 03/31/2019   PLT 196 03/31/2019   Lab Results  Component Value Date   NA 139 03/31/2019   K 3.8 03/31/2019   CL 103 03/31/2019   CO2 28 03/31/2019   BUN 21 (H) 03/31/2019   CREATININE 1.02 (H) 03/31/2019   GLUCOSE 93 03/31/2019   Lab Results  Component Value Date   INR 1.0 03/31/2019    Assessment:   Cervical neck pain with herniated nucleus pulposus/ spondylosis/ stenosis at C6-7. Estimated body mass index is 24.35 kg/m  as calculated from the following:   Height as of 03/31/19: 5' 3.25" (1.607 m).   Weight as of 03/31/19: 62.9 kg.  Patient has failed conservative therapy. Planned surgery : ACDF C6-7 with plate, removal of C4-6 plate  Plan:   I explained the condition and procedure to the patient and answered any questions.  Patient wishes to proceed with procedure as planned. Understands risks/ benefits/ and expected or typical outcomes.  Eustace Moore 04/02/2019 10:28 AM

## 2019-04-02 NOTE — Progress Notes (Signed)
Orthopedic Tech Progress Note Patient Details:  Anna Giles Dec 05, 1960 [id number]  Ortho Devices Type of Ortho Device: Soft collar Ortho Device/Splint Interventions: Application   Post Interventions Patient Tolerated: Well Instructions Provided: Care of device   [place] Pink 04/02/2019, 3:16 PM

## 2019-04-02 NOTE — Anesthesia Postprocedure Evaluation (Signed)
Anesthesia Post Note  Patient: [name]  Procedure(s) Performed: ANTERIOR CERVICAL DECOMPRESSION FUSION CERVICAL SIX-SEVEN, REMOVAL OF PLATE. (N/A Spine Cervical)     Patient location during evaluation: PACU Anesthesia Type: General Level of consciousness: awake and alert Pain management: pain level controlled Vital Signs Assessment: post-procedure vital signs reviewed and stable Respiratory status: spontaneous breathing, nonlabored ventilation, respiratory function stable and patient connected to nasal cannula oxygen Cardiovascular status: blood pressure returned to baseline and stable Postop Assessment: no apparent nausea or vomiting Anesthetic complications: no    Last Vitals:  Vitals:   04/02/19 1444 04/02/19 1505  BP: (!) 136/114 (!) 149/89  Pulse: 69 63  Resp: 12 20  Temp: (!) 36.4 C 36.5 C  SpO2: 98% 100%    Last Pain:  Vitals:   04/02/19 0904  PainSc: 8                  Ryan P Ellender

## 2019-04-02 NOTE — Op Note (Signed)
04/02/2019  1:53 PM  PATIENT:  Anna Giles  58 y.o. female  PRE-OPERATIVE DIAGNOSIS: Adjacent level cervical spondylosis with cervical spinal stenosis with neck and right arm pain, C6-7  POST-OPERATIVE DIAGNOSIS:  same  PROCEDURE:  1. Decompressive anterior cervical discectomy C6-7, 2. Anterior cervical arthrodesis C6-7 utilizing a 1 mm cortical cancellus allograft 3. Anterior cervical plating C6-7 utilizing a Zimmer plate, 4.  Removal of anterior cervical plate C4-C6  SURGEON:  Sherley Bounds, MD  ASSISTANTS: Glenford Peers, FNP  ANESTHESIA:   General  EBL: Less than 25 ml  Total I/O In: 1000 [I.V.:1000] Out: -   BLOOD ADMINISTERED: none  DRAINS: none  SPECIMEN:  none  INDICATION FOR PROCEDURE: This patient presented with neck and right arm pain with numbness and tingling. Imaging showed adjacent level spondylosis and stenosis C6-7 beneath previous C4-C6 fusion. The patient tried conservative measures without relief. Pain was debilitating. Recommended ACDF with plating. Patient understood the risks, benefits, and alternatives and potential outcomes and wished to proceed.  PROCEDURE DETAILS: Patient was brought to the operating room placed under general endotracheal anesthesia. Patient was placed in the supine position on the operating room table. The neck was prepped with Duraprep and draped in a sterile fashion.   Three cc of local anesthesia was injected and a transverse incision was made on the right side of the neck.  Dissection was carried down thru the subcutaneous tissue and the platysma was  elevated, opened, and undermined with Metzenbaum scissors.  Dissection was then carried out thru an avascular plane leaving the sternocleidomastoid carotid artery and jugular vein laterally and the trachea and esophagus medially with the assistance of my nurse practitioner. The ventral aspect of the vertebral column was identified and a localizing x-ray was taken. The C6-7 level was  identified and all in the room agreed with the level.  The anterior cervical plate was identified using blunt dissection and the bottom of the plate was covered with a large anterior osteophyte that covered the C6-7 disc space.  We drilled this away to expose the bottom screws and the bottom part of the plate.  We removed all 6 screws and then remove the plate.  We used Surgifoam in the holes where the screws were to control any oozing.  The longus colli muscles were then elevated and the retractor was placed with the assistance of my nurse practitioner. The annulus at C6-7 was incised and the disc space entered. Discectomy was performed with micro-curettes and pituitary rongeurs. I then used the high-speed drill to drill the endplates down to the level of the posterior longitudinal ligament.  The operating microscope was draped and brought into the field provided additional magnification, illumination and visualization. Discectomy was continued posteriorly thru the disc space. Posterior longitudinal ligament was opened with a nerve hook, and then removed along with disc herniation and osteophytes, decompressing the spinal canal and thecal sac. We then continued to remove osteophytic overgrowth and disc material decompressing the neural foramina and exiting nerve roots bilaterally. The scope was angled up and down to help decompress and undercut the vertebral bodies. Once the decompression was completed we could pass a nerve hook circumferentially to assure adequate decompression in the midline and in the neural foramina. So by both visualization and palpation we felt we had an adequate decompression of the neural elements. We then measured the height of the intravertebral disc space and selected a 7 mm cortical cancellus allograft. It was then gently positioned in the intravertebral disc  space(s) and countersunk. I then used a 22 mm Zimmer plate and placed 12 mm variable angle screws into the vertebral bodies of  each level and locked them into position. The wound was irrigated with bacitracin solution, checked for hemostasis which was established and confirmed. Once meticulous hemostasis was achieved, we then proceeded with closure with the assistance of my nurse practitioner. The platysma was closed with interrupted 3-0 undyed Vicryl suture, the subcuticular layer was closed with interrupted 3-0 undyed Vicryl suture. The skin edges were approximated with steristrips. The drapes were removed. A sterile dressing was applied. The patient was then awakened from general anesthesia and transferred to the recovery room in stable condition. At the end of the procedure all sponge, needle and instrument counts were correct.   PLAN OF CARE: Admit for overnight observation  PATIENT DISPOSITION:  PACU - hemodynamically stable.   Delay start of Pharmacological VTE agent (>24hrs) due to surgical blood loss or risk of bleeding:  yes

## 2019-04-02 NOTE — Anesthesia Preprocedure Evaluation (Addendum)
Anesthesia Evaluation  Patient identified by MRN, date of birth, ID band Patient awake    Reviewed: Allergy & Precautions, NPO status , Patient's Chart, lab work & pertinent test results  Airway Mallampati: III  TM Distance: >3 FB Neck ROM: Limited    Dental  (+) Upper Dentures, Lower Dentures   Pulmonary former smoker,    Pulmonary exam normal breath sounds clear to auscultation       Cardiovascular hypertension, Normal cardiovascular exam Rhythm:Regular Rate:Normal  ECG: SB, rate 53   Neuro/Psych PSYCHIATRIC DISORDERS Anxiety Depression Bipolar Disorder    GI/Hepatic GERD  Medicated and Controlled,(+)     substance abuse  ,   Endo/Other  Hypothyroidism   Renal/GU negative Renal ROS     Musculoskeletal  (+) narcotic dependentChronic neck pain Chronic pain of both shoulders   Abdominal   Peds  Hematology negative hematology ROS (+)   Anesthesia Other Findings Cervical Stenosis  Reproductive/Obstetrics                            Anesthesia Physical Anesthesia Plan  ASA: III  Anesthesia Plan: General   Post-op Pain Management:    Induction: Intravenous  PONV Risk Score and Plan: 4 or greater and Midazolam, Dexamethasone, Ondansetron and Treatment may vary due to age or medical condition  Airway Management Planned: Oral ETT and Video Laryngoscope Planned  Additional Equipment:   Intra-op Plan:   Post-operative Plan: Extubation in OR  Informed Consent: I have reviewed the patients History and Physical, chart, labs and discussed the procedure including the risks, benefits and alternatives for the proposed anesthesia with the patient or authorized representative who has indicated his/her understanding and acceptance.       Plan Discussed with: CRNA  Anesthesia Plan Comments: (Ketamine Dexmetetomidine )      Anesthesia Quick Evaluation

## 2019-04-02 NOTE — Transfer of Care (Signed)
Immediate Anesthesia Transfer of Care Note  Patient: Anna Giles  Procedure(s) Performed: ANTERIOR CERVICAL DECOMPRESSION FUSION CERVICAL SIX-SEVEN, REMOVAL OF PLATE. (N/A Spine Cervical)  Patient Location: PACU  Anesthesia Type:General  Level of Consciousness: awake, alert  and oriented  Airway & Oxygen Therapy: Patient Spontanous Breathing and Patient connected to nasal cannula oxygen  Post-op Assessment: Report given to RN and Post -op Vital signs reviewed and stable  Post vital signs: Reviewed and stable  Last Vitals:  Vitals Value Taken Time  BP 142/104 04/02/19 1359  Temp    Pulse 82 04/02/19 1404  Resp 17 04/02/19 1404  SpO2 100 % 04/02/19 1404  Vitals shown include unvalidated device data.  Last Pain:  Vitals:   04/02/19 0904  PainSc: 8       Patients Stated Pain Goal: 3 ([id number])  Complications: No apparent anesthesia complications

## 2019-04-03 NOTE — Progress Notes (Signed)
Patient alert and oriented, mae's well, voiding adequate amount of urine, swallowing without difficulty, no c/o pain at time of discharge. Patient discharged home with family. Script and discharged instructions given to patient. Patient and family stated understanding of instructions given. Patient has an appointment with Dr. Jones °

## 2019-04-03 NOTE — Discharge Summary (Signed)
Physician Discharge Summary  Patient ID: Anna Giles MRN: [id number] DOB/AGE: [birth year] 58 y.o.  Admit date: 04/02/2019 Discharge date: 04/03/2019  Admission Diagnoses: Adjacent level cervical spondylosis with cervical spinal stenosis with neck and right arm pain, C6-7  Discharge Diagnoses:  Active Problems:   S/P cervical spinal fusion   Discharged Condition: good  Hospital Course: Patient was admitted following ACDF at C6-7. She is doing well post operatively. She has ambulated in the hall. Her pain is well-controlled. She is ready for discharge home.  Consults: None  Significant Diagnostic Studies: radiology: Chest 2 View  Result Date: 03/31/2019 CLINICAL DATA:  Pre-op respiratory exam for cervical spine surgery. EXAM: CHEST - 2 VIEW COMPARISON:  10/24/2003 FINDINGS: The heart size and mediastinal contours are within normal limits. Both lungs are clear. Cervical spine fusion hardware noted. IMPRESSION: No active cardiopulmonary disease. Electronically Signed   By: Marlaine Hind M.D.   On: 03/31/2019 15:07   Dg Cervical Spine 2-3 Views  Result Date: 04/02/2019 CLINICAL DATA:  Anterior cervical fusion C6-C7 EXAM: CERVICAL SPINE - 2-3 VIEW; DG C-ARM 1-60 MIN COMPARISON:  CT cervical spine 12/16/2018 FLUOROSCOPY TIME:  0 minutes 12 seconds FINDINGS: Three submitted images. Osseous demineralization. Prior fusion of C4-C6. Anterior plate at [id number] removed. New anterior plate identified [id number]. Suboptimal assessment of alignment due to superimposition of the shoulders. IMPRESSION: Removal of C4-C6 plate. Anterior fusion C6-C7. Electronically Signed   By: Lavonia Dana M.D.   On: 04/02/2019 20:26   Dg C-arm 1-60 Min  Result Date: 04/02/2019 CLINICAL DATA:  Anterior cervical fusion C6-C7 EXAM: CERVICAL SPINE - 2-3 VIEW; DG C-ARM 1-60 MIN COMPARISON:  CT cervical spine 12/16/2018 FLUOROSCOPY TIME:  0 minutes 12 seconds FINDINGS: Three submitted images. Osseous demineralization. Prior fusion of  C4-C6. Anterior plate at [id number] removed. New anterior plate identified [id number]. Suboptimal assessment of alignment due to superimposition of the shoulders. IMPRESSION: Removal of C4-C6 plate. Anterior fusion C6-C7. Electronically Signed   By: Lavonia Dana M.D.   On: 04/02/2019 20:26     Treatments: surgery: 1. Decompressive anterior cervical discectomy C6-7, 2. Anterior cervical arthrodesis C6-7 utilizing a 1 mm cortical cancellus allograft 3. Anterior cervical plating C6-7 utilizing a Zimmer plate, 4.  Removal of anterior cervical plate C4-C6  Discharge Exam: Blood pressure 100/68, pulse 62, temperature 98.3 F (36.8 C), temperature source Oral, resp. rate 16, SpO2 99 %.   Alert and oriented x 4 PERRLA CN II-XII grossly intact MAE, Strength and sensation intact Incision is covered with Honeycomb dressing and Steri Strips; Dressing is  Dry and intact with a small amount of dried sanguinous drainage.   Disposition:    Allergies as of 04/03/2019   No Known Allergies     Medication List    TAKE these medications   busPIRone 10 MG tablet Commonly known as: BUSPAR Take 1 tablet (10 mg total) by mouth 2 (two) times daily. What changed: when to take this   escitalopram 10 MG tablet Commonly known as: LEXAPRO Take 15 mg daily What changed:   how much to take  how to take this  when to take this  additional instructions   levothyroxine 88 MCG tablet Commonly known as: SYNTHROID TAKE ONE TABLET BY MOUTH EVERY MORNING 30 MINUTES BEFORE BREAKFAST What changed: See the new instructions.   MiraLax 17 g packet Generic drug: polyethylene glycol Take 17 g by mouth daily as needed (constipation).   morphine 60 MG 12 hr tablet Commonly known as: MS CONTIN Take  60 mg by mouth 3 (three) times daily. *scheduled*   oxyCODONE-acetaminophen 5-325 MG tablet Commonly known as: PERCOCET/ROXICET Take 1 tablet by mouth 3 (three) times daily. *scheduled*   pantoprazole 40 MG  tablet Commonly known as: PROTONIX Take 1 tablet (40 mg total) by mouth daily. What changed: when to take this   QUEtiapine 25 MG tablet Commonly known as: SEROQUEL Take 1 tablet (25 mg total) by mouth at bedtime.   tiZANidine 4 MG tablet Commonly known as: ZANAFLEX Take 6 mg by mouth 3 (three) times daily. *scheduled*   VITAMIN D3 PO Take 1 tablet by mouth daily.      Follow-up Information    Eustace Moore, MD. Go on 04/15/2019.   Specialty: Neurosurgery Contact information: [address] [place] [address] [phone]           Signed: Patricia Nettle 04/03/2019, 9:37 AM

## 2019-04-03 NOTE — Discharge Instructions (Signed)

## 2019-04-06 ENCOUNTER — Encounter (HOSPITAL_COMMUNITY): Payer: Self-pay | Admitting: Neurological Surgery

## 2019-04-11 ENCOUNTER — Other Ambulatory Visit: Payer: Self-pay | Admitting: Family Medicine

## 2019-04-11 DIAGNOSIS — E039 Hypothyroidism, unspecified: Secondary | ICD-10-CM

## 2019-04-12 NOTE — Telephone Encounter (Signed)
Pt would like to know why she has to come in. Stated on her last visit you asked her to return in 6 months and she already have that appt scheduled for the first part of march. Please advise

## 2019-04-13 NOTE — Telephone Encounter (Signed)
Pt informed that it will be lab work only. She will be in next week, not able to come this week because she just had surgery and she is not able to drive.

## 2019-05-13 DIAGNOSIS — M961 Postlaminectomy syndrome, not elsewhere classified: Secondary | ICD-10-CM | POA: Diagnosis not present

## 2019-05-13 DIAGNOSIS — M4722 Other spondylosis with radiculopathy, cervical region: Secondary | ICD-10-CM | POA: Diagnosis not present

## 2019-05-13 DIAGNOSIS — Z79891 Long term (current) use of opiate analgesic: Secondary | ICD-10-CM | POA: Diagnosis not present

## 2019-05-13 DIAGNOSIS — G894 Chronic pain syndrome: Secondary | ICD-10-CM | POA: Diagnosis not present

## 2019-05-20 DIAGNOSIS — M542 Cervicalgia: Secondary | ICD-10-CM | POA: Diagnosis not present

## 2019-05-21 ENCOUNTER — Other Ambulatory Visit: Payer: Self-pay

## 2019-05-21 ENCOUNTER — Ambulatory Visit (INDEPENDENT_AMBULATORY_CARE_PROVIDER_SITE_OTHER): Payer: Medicare HMO

## 2019-05-21 VITALS — Ht 64.0 in | Wt 135.0 lb

## 2019-05-21 DIAGNOSIS — Z0000 Encounter for general adult medical examination without abnormal findings: Secondary | ICD-10-CM

## 2019-05-21 NOTE — Patient Instructions (Signed)
Anna Giles , Thank you for taking time to come for your Medicare Wellness Visit. I appreciate your ongoing commitment to your health goals. Please review the following plan we discussed and let me know if I can assist you in the future.   Screening recommendations/referrals: Colonoscopy: done 07/29/11. Repeat in 2023. Mammogram: due. Please call [phone] to schedule your mammogram.  Bone Density: due at age 59 Recommended yearly ophthalmology/optometry visit for glaucoma screening and checkup Recommended yearly dental visit for hygiene and checkup  Vaccinations: Influenza vaccine: postponed  Pneumococcal vaccine: postponed Tdap vaccine: due Shingles vaccine: Shingrix discussed. Please contact your pharmacy for coverage information.   Advanced directives: Advance directive discussed with you today. I have provided a copy for you to complete at home and have notarized. Once this is complete please bring a copy in to our office so we can scan it into your chart.  Conditions/risks identified: Keep up the great work!  Next appointment: Please follow up in one year for your Medicare Annual Wellness visit.    Preventive Care 40-64 Years, Female Preventive care refers to lifestyle choices and visits with your health care provider that can promote health and wellness. What does preventive care include?  A yearly physical exam. This is also called an annual well check.  Dental exams once or twice a year.  Routine eye exams. Ask your health care provider how often you should have your eyes checked.  Personal lifestyle choices, including:  Daily care of your teeth and gums.  Regular physical activity.  Eating a healthy diet.  Avoiding tobacco and drug use.  Limiting alcohol use.  Practicing safe sex.  Taking low-dose aspirin daily starting at age 21.  Taking vitamin and mineral supplements as recommended by your health care provider. What happens during an annual well  check? The services and screenings done by your health care provider during your annual well check will depend on your age, overall health, lifestyle risk factors, and family history of disease. Counseling  Your health care provider may ask you questions about your:  Alcohol use.  Tobacco use.  Drug use.  Emotional well-being.  Home and relationship well-being.  Sexual activity.  Eating habits.  Work and work Statistician.  Method of birth control.  Menstrual cycle.  Pregnancy history. Screening  You may have the following tests or measurements:  Height, weight, and BMI.  Blood pressure.  Lipid and cholesterol levels. These may be checked every 5 years, or more frequently if you are over 37 years old.  Skin check.  Lung cancer screening. You may have this screening every year starting at age 3 if you have a 30-pack-year history of smoking and currently smoke or have quit within the past 15 years.  Fecal occult blood test (FOBT) of the stool. You may have this test every year starting at age 93.  Flexible sigmoidoscopy or colonoscopy. You may have a sigmoidoscopy every 5 years or a colonoscopy every 10 years starting at age 9.  Hepatitis C blood test.  Hepatitis B blood test.  Sexually transmitted disease (STD) testing.  Diabetes screening. This is done by checking your blood sugar (glucose) after you have not eaten for a while (fasting). You may have this done every 1-3 years.  Mammogram. This may be done every 1-2 years. Talk to your health care provider about when you should start having regular mammograms. This may depend on whether you have a family history of breast cancer.  BRCA-related cancer screening. This may  be done if you have a family history of breast, ovarian, tubal, or peritoneal cancers.  Pelvic exam and Pap test. This may be done every 3 years starting at age 44. Starting at age 72, this may be done every 5 years if you have a Pap test in  combination with an HPV test.  Bone density scan. This is done to screen for osteoporosis. You may have this scan if you are at high risk for osteoporosis. Discuss your test results, treatment options, and if necessary, the need for more tests with your health care provider. Vaccines  Your health care provider may recommend certain vaccines, such as:  Influenza vaccine. This is recommended every year.  Tetanus, diphtheria, and acellular pertussis (Tdap, Td) vaccine. You may need a Td booster every 10 years.  Zoster vaccine. You may need this after age 30.  Pneumococcal 13-valent conjugate (PCV13) vaccine. You may need this if you have certain conditions and were not previously vaccinated.  Pneumococcal polysaccharide (PPSV23) vaccine. You may need one or two doses if you smoke cigarettes or if you have certain conditions. Talk to your health care provider about which screenings and vaccines you need and how often you need them. This information is not intended to replace advice given to you by your health care provider. Make sure you discuss any questions you have with your health care provider. Document Released: 05/12/2015 Document Revised: 01/03/2016 Document Reviewed: 02/14/2015 Elsevier Interactive Patient Education  2017 [place] Prevention in the Home Falls can cause injuries. They can happen to people of all ages. There are many things you can do to make your home safe and to help prevent falls. What can I do on the outside of my home?  Regularly fix the edges of walkways and driveways and fix any cracks.  Remove anything that might make you trip as you walk through a door, such as a raised step or threshold.  Trim any bushes or trees on the path to your home.  Use bright outdoor lighting.  Clear any walking paths of anything that might make someone trip, such as rocks or tools.  Regularly check to see if handrails are loose or broken. Make sure that both  sides of any steps have handrails.  Any raised decks and porches should have guardrails on the edges.  Have any leaves, snow, or ice cleared regularly.  Use sand or salt on walking paths during winter.  Clean up any spills in your garage right away. This includes oil or grease spills. What can I do in the bathroom?  Use night lights.  Install grab bars by the toilet and in the tub and shower. Do not use towel bars as grab bars.  Use non-skid mats or decals in the tub or shower.  If you need to sit down in the shower, use a plastic, non-slip stool.  Keep the floor dry. Clean up any water that spills on the floor as soon as it happens.  Remove soap buildup in the tub or shower regularly.  Attach bath mats securely with double-sided non-slip rug tape.  Do not have throw rugs and other things on the floor that can make you trip. What can I do in the bedroom?  Use night lights.  Make sure that you have a light by your bed that is easy to reach.  Do not use any sheets or blankets that are too big for your bed. They should not hang down onto  the floor.  Have a firm chair that has side arms. You can use this for support while you get dressed.  Do not have throw rugs and other things on the floor that can make you trip. What can I do in the kitchen?  Clean up any spills right away.  Avoid walking on wet floors.  Keep items that you use a lot in easy-to-reach places.  If you need to reach something above you, use a strong step stool that has a grab bar.  Keep electrical cords out of the way.  Do not use floor polish or wax that makes floors slippery. If you must use wax, use non-skid floor wax.  Do not have throw rugs and other things on the floor that can make you trip. What can I do with my stairs?  Do not leave any items on the stairs.  Make sure that there are handrails on both sides of the stairs and use them. Fix handrails that are broken or loose. Make sure that  handrails are as long as the stairways.  Check any carpeting to make sure that it is firmly attached to the stairs. Fix any carpet that is loose or worn.  Avoid having throw rugs at the top or bottom of the stairs. If you do have throw rugs, attach them to the floor with carpet tape.  Make sure that you have a light switch at the top of the stairs and the bottom of the stairs. If you do not have them, ask someone to add them for you. What else can I do to help prevent falls?  Wear shoes that:  Do not have high heels.  Have rubber bottoms.  Are comfortable and fit you well.  Are closed at the toe. Do not wear sandals.  If you use a stepladder:  Make sure that it is fully opened. Do not climb a closed stepladder.  Make sure that both sides of the stepladder are locked into place.  Ask someone to hold it for you, if possible.  Clearly mark and make sure that you can see:  Any grab bars or handrails.  First and last steps.  Where the edge of each step is.  Use tools that help you move around (mobility aids) if they are needed. These include:  Canes.  Walkers.  Scooters.  Crutches.  Turn on the lights when you go into a dark area. Replace any light bulbs as soon as they burn out.  Set up your furniture so you have a clear path. Avoid moving your furniture around.  If any of your floors are uneven, fix them.  If there are any pets around you, be aware of where they are.  Review your medicines with your doctor. Some medicines can make you feel dizzy. This can increase your chance of falling. Ask your doctor what other things that you can do to help prevent falls. This information is not intended to replace advice given to you by your health care provider. Make sure you discuss any questions you have with your health care provider. Document Released: 02/09/2009 Document Revised: 09/21/2015 Document Reviewed: 05/20/2014 Elsevier Interactive Patient Education  2017  [employer].
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
## 2019-05-25 ENCOUNTER — Encounter: Payer: Self-pay | Admitting: Family Medicine

## 2019-06-18 ENCOUNTER — Other Ambulatory Visit: Payer: Self-pay | Admitting: Family Medicine

## 2019-06-18 DIAGNOSIS — E039 Hypothyroidism, unspecified: Secondary | ICD-10-CM

## 2019-06-18 NOTE — Telephone Encounter (Signed)
Requested Prescriptions  Pending Prescriptions Disp Refills  . levothyroxine (SYNTHROID) 88 MCG tablet [Pharmacy Med Name: LEVOTHYROXINE 88 MCG TABLET] 90 tablet 0    Sig: TAKE 1 TABLET BY MOUTH EVERY MORNING 58 MINUTES BEFORE BREAKFAST     Endocrinology:  Hypothyroid Agents Failed - 06/18/2019  8:24 AM      Failed - TSH needs to be rechecked within 3 months after an abnormal result. Refill until TSH is due.      Failed - TSH in normal range and within 360 days    TSH  Date Value Ref Range Status  12/28/2018 4.76 (H) 0.40 - 4.50 mIU/L Final         Passed - Valid encounter within last 12 months    Recent Outpatient Visits          5 months ago Bipolar affective disorder, currently depressed, mild [facility])   [facility] [place], Drue Stager, MD   10 months ago Bipolar affective disorder, currently depressed, mild [facility])   [facility] Steele Sizer, MD   1 year ago Bipolar affective disorder, currently depressed, mild [facility])   [facility], Drue Stager, MD   1 year ago Chronic gastritis without bleeding, unspecified gastritis type   [facility] Steele Sizer, MD   2 years ago Frequent urination   [place], MD      Future Appointments            In 1 week Steele Sizer, MD [facility], [place]   In 11 months  [facility]

## 2019-06-28 ENCOUNTER — Ambulatory Visit: Payer: Medicare HMO | Admitting: Family Medicine

## 2019-07-06 ENCOUNTER — Other Ambulatory Visit: Payer: Self-pay | Admitting: Family Medicine

## 2019-07-06 DIAGNOSIS — Z79891 Long term (current) use of opiate analgesic: Secondary | ICD-10-CM | POA: Diagnosis not present

## 2019-07-06 DIAGNOSIS — G894 Chronic pain syndrome: Secondary | ICD-10-CM | POA: Diagnosis not present

## 2019-07-06 DIAGNOSIS — F3131 Bipolar disorder, current episode depressed, mild: Secondary | ICD-10-CM

## 2019-07-06 DIAGNOSIS — M4722 Other spondylosis with radiculopathy, cervical region: Secondary | ICD-10-CM | POA: Diagnosis not present

## 2019-07-06 DIAGNOSIS — M961 Postlaminectomy syndrome, not elsewhere classified: Secondary | ICD-10-CM | POA: Diagnosis not present

## 2019-07-06 NOTE — Telephone Encounter (Signed)
Requested Prescriptions  Pending Prescriptions Disp Refills  . escitalopram (LEXAPRO) 10 MG tablet [Pharmacy Med Name: ESCITALOPRAM 10 MG TABLET] 135 tablet 0    Sig: TAKE 1.5 TABLETS BY MOUTH DAILY     Psychiatry:  Antidepressants - SSRI Passed - 07/06/2019  6:21 AM      Passed - Valid encounter within last 6 months    Recent Outpatient Visits          6 months ago Bipolar affective disorder, currently depressed, mild ([place])   [facility] [place], Drue Stager, MD   11 months ago Bipolar affective disorder, currently depressed, mild [facility])   [facility] Steele Sizer, MD   1 year ago Bipolar affective disorder, currently depressed, mild [facility])   [facility], Drue Stager, MD   1 year ago Chronic gastritis without bleeding, unspecified gastritis type   [facility] Steele Sizer, MD   2 years ago Frequent urination   [facility] Roselee Nova, MD      Future Appointments            In 10 months [facility]

## 2019-07-09 ENCOUNTER — Other Ambulatory Visit: Payer: Self-pay | Admitting: Family Medicine

## 2019-07-09 DIAGNOSIS — F5104 Psychophysiologic insomnia: Secondary | ICD-10-CM

## 2019-07-09 DIAGNOSIS — F3131 Bipolar disorder, current episode depressed, mild: Secondary | ICD-10-CM

## 2019-08-04 ENCOUNTER — Other Ambulatory Visit: Payer: Self-pay | Admitting: Family Medicine

## 2019-08-04 DIAGNOSIS — F5104 Psychophysiologic insomnia: Secondary | ICD-10-CM

## 2019-08-04 DIAGNOSIS — F3131 Bipolar disorder, current episode depressed, mild: Secondary | ICD-10-CM

## 2019-08-04 NOTE — Telephone Encounter (Signed)
Requested medications are due for refill today?  Yes  - QUEtiapine FUMARATE cannot be delegated.   Requested medications are on active medication list?  Yes  Last Refill:  QUEtiapine FUMARATE -   07/09/2019   # 30 with no refills Escitalopram - 07/06/2019   # 45 with no refills   Future visit scheduled?  Yes - scheduled on 08/17/2019  Notes to Clinic:  Both Refills on the medications above were refilled on 07/06/2019 with notes that patient needs to schedule an office visit for further refills.

## 2019-08-07 ENCOUNTER — Ambulatory Visit: Payer: Medicare HMO | Attending: Internal Medicine

## 2019-08-07 DIAGNOSIS — Z23 Encounter for immunization: Secondary | ICD-10-CM

## 2019-08-07 NOTE — Progress Notes (Signed)
   Covid-19 Vaccination Clinic  Name:  Anna Giles    MRN: [id number] DOB: [birth year]  08/07/2019  Anna Giles was observed post Covid-19 immunization for 15 minutes without incident. She was provided with Vaccine Information Sheet and instruction to access the V-Safe system.   Anna Giles was instructed to call 911 with any severe reactions post vaccine: Marland Kitchen Difficulty breathing  . Swelling of face and throat  . A fast heartbeat  . A bad rash all over body  . Dizziness and weakness   Immunizations Administered    Name Date Dose VIS Date Route   Pfizer COVID-19 Vaccine 08/07/2019 12:10 PM 0.3 mL 04/09/2019 Intramuscular   Manufacturer: [place]   Lot: [id number]   [place]: [id number]

## 2019-08-17 ENCOUNTER — Encounter: Payer: Self-pay | Admitting: Family Medicine

## 2019-08-17 ENCOUNTER — Ambulatory Visit (INDEPENDENT_AMBULATORY_CARE_PROVIDER_SITE_OTHER): Payer: Medicare HMO | Admitting: Family Medicine

## 2019-08-17 VITALS — Ht 63.75 in | Wt 134.0 lb

## 2019-08-17 DIAGNOSIS — F3131 Bipolar disorder, current episode depressed, mild: Secondary | ICD-10-CM

## 2019-08-17 DIAGNOSIS — T402X5A Adverse effect of other opioids, initial encounter: Secondary | ICD-10-CM

## 2019-08-17 DIAGNOSIS — K5903 Drug induced constipation: Secondary | ICD-10-CM | POA: Diagnosis not present

## 2019-08-17 DIAGNOSIS — G8929 Other chronic pain: Secondary | ICD-10-CM | POA: Diagnosis not present

## 2019-08-17 DIAGNOSIS — R69 Illness, unspecified: Secondary | ICD-10-CM | POA: Diagnosis not present

## 2019-08-17 DIAGNOSIS — F1021 Alcohol dependence, in remission: Secondary | ICD-10-CM | POA: Diagnosis not present

## 2019-08-17 DIAGNOSIS — M542 Cervicalgia: Secondary | ICD-10-CM

## 2019-08-17 DIAGNOSIS — E785 Hyperlipidemia, unspecified: Secondary | ICD-10-CM | POA: Diagnosis not present

## 2019-08-17 DIAGNOSIS — K2950 Unspecified chronic gastritis without bleeding: Secondary | ICD-10-CM

## 2019-08-17 DIAGNOSIS — E039 Hypothyroidism, unspecified: Secondary | ICD-10-CM

## 2019-08-17 DIAGNOSIS — K219 Gastro-esophageal reflux disease without esophagitis: Secondary | ICD-10-CM

## 2019-08-17 DIAGNOSIS — F5104 Psychophysiologic insomnia: Secondary | ICD-10-CM

## 2019-08-17 MED ORDER — BUSPIRONE HCL 10 MG PO TABS: 10.0000 mg | ORAL_TABLET | Freq: Two times a day (BID) | ORAL | 1 refills | Status: DC

## 2019-08-17 MED ORDER — QUETIAPINE FUMARATE 25 MG PO TABS: 25.0000 mg | ORAL_TABLET | Freq: Every day | ORAL | 1 refills | Status: DC

## 2019-08-17 MED ORDER — ESCITALOPRAM OXALATE 10 MG PO TABS: ORAL_TABLET | ORAL | 1 refills | Status: DC

## 2019-08-17 MED ORDER — PANTOPRAZOLE SODIUM 40 MG PO TBEC: 40.0000 mg | DELAYED_RELEASE_TABLET | Freq: Every day | ORAL | 1 refills | Status: DC
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
## 2019-08-18 ENCOUNTER — Other Ambulatory Visit: Payer: Self-pay | Admitting: Family Medicine

## 2019-08-18 DIAGNOSIS — K2950 Unspecified chronic gastritis without bleeding: Secondary | ICD-10-CM

## 2019-08-18 DIAGNOSIS — K219 Gastro-esophageal reflux disease without esophagitis: Secondary | ICD-10-CM

## 2019-08-31 ENCOUNTER — Ambulatory Visit: Payer: Medicare HMO | Attending: Internal Medicine

## 2019-08-31 DIAGNOSIS — Z23 Encounter for immunization: Secondary | ICD-10-CM

## 2019-08-31 NOTE — Progress Notes (Signed)
   Covid-19 Vaccination Clinic  Name:  Anna Giles    MRN: [id number] DOB: 1961-03-27  08/31/2019  [name] was observed post Covid-19 immunization for 15 minutes without incident. She was provided with Vaccine Information Sheet and instruction to access the V-Safe system.   [name] was instructed to call 911 with any severe reactions post vaccine: Marland Kitchen Difficulty breathing  . Swelling of face and throat  . A fast heartbeat  . A bad rash all over body  . Dizziness and weakness   Immunizations Administered    Name Date Dose VIS Date Route   Pfizer COVID-19 Vaccine 08/31/2019 12:20 PM 0.3 mL 06/23/2018 Intramuscular   Manufacturer: [place]   Lot: [id number]   [place]: [id number]

## 2019-09-07 DIAGNOSIS — M542 Cervicalgia: Secondary | ICD-10-CM | POA: Diagnosis not present

## 2019-09-09 ENCOUNTER — Encounter: Payer: Self-pay | Admitting: Family Medicine

## 2019-09-09 DIAGNOSIS — M4722 Other spondylosis with radiculopathy, cervical region: Secondary | ICD-10-CM | POA: Diagnosis not present

## 2019-09-09 DIAGNOSIS — M961 Postlaminectomy syndrome, not elsewhere classified: Secondary | ICD-10-CM | POA: Diagnosis not present

## 2019-09-09 DIAGNOSIS — G894 Chronic pain syndrome: Secondary | ICD-10-CM | POA: Diagnosis not present

## 2019-09-09 DIAGNOSIS — Z79891 Long term (current) use of opiate analgesic: Secondary | ICD-10-CM | POA: Diagnosis not present

## 2019-09-22 ENCOUNTER — Other Ambulatory Visit: Payer: Self-pay | Admitting: Family Medicine

## 2019-09-22 DIAGNOSIS — Z1239 Encounter for other screening for malignant neoplasm of breast: Secondary | ICD-10-CM

## 2019-10-12 ENCOUNTER — Other Ambulatory Visit: Payer: Self-pay | Admitting: Family Medicine

## 2019-10-12 DIAGNOSIS — E039 Hypothyroidism, unspecified: Secondary | ICD-10-CM

## 2019-10-12 NOTE — Telephone Encounter (Signed)
Requested medications are due for refill today? Yes  Requested medications are on active medication list?  Yes   Last Refill:  06/18/2019 # 90 with no refills  Future visit scheduled?  Yes  Notes to Clinic:   TSH elevated in August 2020.  Per lab result note patient was supposed to have TSH rechecked 6 weeks after abnormal value.  Order for lab entered, but patient never had labs drawn.

## 2019-10-13 NOTE — Telephone Encounter (Signed)
Spoke with Patient and she agreed to come in for labs at some point today.

## 2019-10-14 ENCOUNTER — Other Ambulatory Visit: Payer: Self-pay | Admitting: Family Medicine

## 2019-10-14 DIAGNOSIS — E039 Hypothyroidism, unspecified: Secondary | ICD-10-CM

## 2019-10-19 ENCOUNTER — Ambulatory Visit: Payer: Medicare HMO | Attending: Neurological Surgery

## 2019-10-21 ENCOUNTER — Ambulatory Visit: Payer: Medicare HMO

## 2019-11-08 DIAGNOSIS — M961 Postlaminectomy syndrome, not elsewhere classified: Secondary | ICD-10-CM | POA: Diagnosis not present

## 2019-11-08 DIAGNOSIS — G894 Chronic pain syndrome: Secondary | ICD-10-CM | POA: Diagnosis not present

## 2019-11-08 DIAGNOSIS — Z79891 Long term (current) use of opiate analgesic: Secondary | ICD-10-CM | POA: Diagnosis not present

## 2019-11-08 DIAGNOSIS — M4722 Other spondylosis with radiculopathy, cervical region: Secondary | ICD-10-CM | POA: Diagnosis not present

## 2019-11-09 ENCOUNTER — Other Ambulatory Visit: Payer: Self-pay | Admitting: Family Medicine

## 2019-11-09 DIAGNOSIS — E039 Hypothyroidism, unspecified: Secondary | ICD-10-CM | POA: Diagnosis not present

## 2019-11-09 NOTE — Telephone Encounter (Signed)
Requested Prescriptions  Pending Prescriptions Disp Refills   levothyroxine (SYNTHROID) 88 MCG tablet [Pharmacy Med Name: LEVOTHYROXINE 88 MCG TABLET] 7 tablet 0    Sig: TAKE ONE TABLET BY MOUTH DAILY 91 MINUTES BEFORE BREAKFAST     Endocrinology:  Hypothyroid Agents Failed - 11/09/2019  1:12 PM      Failed - TSH needs to be rechecked within 3 months after an abnormal result. Refill until TSH is due.      Failed - TSH in normal range and within 360 days    TSH  Date Value Ref Range Status  12/28/2018 4.76 (H) 0.40 - 4.50 mIU/L Final         Passed - Valid encounter within last 12 months    Recent Outpatient Visits          2 months ago History of alcoholism [facility])   [facility] [place], Drue Stager, MD   10 months ago Bipolar affective disorder, currently depressed, mild [facility])   [facility] Steele Sizer, MD   1 year ago Bipolar affective disorder, currently depressed, mild [facility])   [facility] Steele Sizer, MD   1 year ago Bipolar affective disorder, currently depressed, mild [facility])   [facility] [place], Drue Stager, MD   2 years ago Chronic gastritis without bleeding, unspecified gastritis type   [facility], MD      Future Appointments            In 3 months Ancil Boozer, Drue Stager, MD [facility], [place]   In 6 months  [facility], [facility]

## 2019-11-10 ENCOUNTER — Other Ambulatory Visit: Payer: Self-pay | Admitting: Family Medicine

## 2019-11-10 DIAGNOSIS — E039 Hypothyroidism, unspecified: Secondary | ICD-10-CM

## 2019-11-10 LAB — TSH: TSH: 2.76 mIU/L (ref 0.40–4.50)

## 2019-11-10 MED ORDER — LEVOTHYROXINE SODIUM 88 MCG PO TABS: 88.0000 ug | ORAL_TABLET | Freq: Every day | ORAL | 0 refills | Status: DC

## 2019-11-17 NOTE — Progress Notes (Signed)
Reviewed lab results and physician's note with the patient. No further questions. 

## 2019-12-08 ENCOUNTER — Telehealth: Payer: Self-pay | Admitting: *Deleted

## 2019-12-08 NOTE — Telephone Encounter (Signed)
(  12/08/2019) Left message for pt to notify them that it is time to schedule annual low dose lung cancer screening CT scan. Instructed patient to call back to verify information prior to the scan being scheduled SRW

## 2019-12-23 DIAGNOSIS — M542 Cervicalgia: Secondary | ICD-10-CM | POA: Diagnosis not present

## 2020-01-08 ENCOUNTER — Telehealth: Payer: Self-pay | Admitting: *Deleted

## 2020-01-08 NOTE — Telephone Encounter (Signed)
Left a voicemail to inform patient that she is currently due for her lung cancer screening scan. Instructed patient to call back to update information and get the CT scan scheduled.

## 2020-01-20 DIAGNOSIS — Z79891 Long term (current) use of opiate analgesic: Secondary | ICD-10-CM | POA: Diagnosis not present

## 2020-01-20 DIAGNOSIS — M4722 Other spondylosis with radiculopathy, cervical region: Secondary | ICD-10-CM | POA: Diagnosis not present

## 2020-01-20 DIAGNOSIS — M961 Postlaminectomy syndrome, not elsewhere classified: Secondary | ICD-10-CM | POA: Diagnosis not present

## 2020-01-20 DIAGNOSIS — G894 Chronic pain syndrome: Secondary | ICD-10-CM | POA: Diagnosis not present

## 2020-02-03 ENCOUNTER — Telehealth: Payer: Self-pay | Admitting: *Deleted

## 2020-02-03 NOTE — Telephone Encounter (Signed)
Attempted to contact and schedule lung screening scan. Message left for patient to call back to schedule. 

## 2020-02-10 ENCOUNTER — Other Ambulatory Visit: Payer: Self-pay | Admitting: Family Medicine

## 2020-02-10 DIAGNOSIS — F5104 Psychophysiologic insomnia: Secondary | ICD-10-CM

## 2020-02-10 DIAGNOSIS — K219 Gastro-esophageal reflux disease without esophagitis: Secondary | ICD-10-CM

## 2020-02-10 DIAGNOSIS — F3131 Bipolar disorder, current episode depressed, mild: Secondary | ICD-10-CM

## 2020-02-10 DIAGNOSIS — K2950 Unspecified chronic gastritis without bleeding: Secondary | ICD-10-CM

## 2020-02-10 NOTE — Telephone Encounter (Signed)
Requested medication (s) are due for refill today: Due 02/16/20  Requested medication (s) are on the active medication list:yes   Last refill: 08/17/19 #90  1 refill  Future visit scheduled yes 02/22/20  Notes to clinic: not delegated  Requested Prescriptions  Pending Prescriptions Disp Refills   QUEtiapine (SEROQUEL) 25 MG tablet [Pharmacy Med Name: QUEtiapine FUMARATE 25 MG TAB] 90 tablet 1    Sig: TAKE ONE TABLET BY MOUTH EVERY NIGHT AT BEDTIME      Not Delegated - Psychiatry:  Antipsychotics - Second Generation (Atypical) - quetiapine Failed - 02/10/2020  6:21 AM      Failed - This refill cannot be delegated      Failed - ALT in normal range and within 180 days    ALT  Date Value Ref Range Status  12/28/2018 21 6 - 29 U/L Final   SGPT (ALT)  Date Value Ref Range Status  01/17/2013 33 12 - 78 U/L Final          Failed - AST in normal range and within 180 days    AST  Date Value Ref Range Status  12/28/2018 20 10 - 35 U/L Final   SGOT(AST)  Date Value Ref Range Status  01/17/2013 29 15 - 37 Unit/L Final          Passed - Last BP in normal range    BP Readings from Last 1 Encounters:  04/03/19 100/68          Passed - Valid encounter within last 6 months    Recent Outpatient Visits           5 months ago History of alcoholism ([place])   [facility] Steele Sizer, MD   1 year ago Bipolar affective disorder, currently depressed, mild [facility])   [facility] Steele Sizer, MD   1 year ago Bipolar affective disorder, currently depressed, mild [facility])   [facility] Steele Sizer, MD   1 year ago Bipolar affective disorder, currently depressed, mild [facility])   [facility], Drue Stager, MD   2 years ago Chronic gastritis without bleeding, unspecified gastritis type   [facility] Steele Sizer, MD       Future Appointments             In 1 week Steele Sizer,  MD [facility], [place]   In 3 months  [facility]             Signed Prescriptions Disp Refills   busPIRone (BUSPAR) 10 MG tablet 180 tablet 0    Sig: TAKE ONE TABLET BY MOUTH TWICE A DAY      Psychiatry: Anxiolytics/Hypnotics - Non-controlled Passed - 02/10/2020  6:21 AM      Passed - Valid encounter within last 6 months    Recent Outpatient Visits           5 months ago History of alcoholism [facility])   [facility] Steele Sizer, MD   1 year ago Bipolar affective disorder, currently depressed, mild [facility])   [facility] Steele Sizer, MD   1 year ago Bipolar affective disorder, currently depressed, mild [facility])   [facility] Steele Sizer, MD   1 year ago Bipolar affective disorder, currently depressed, mild [facility])   [facility], Drue Stager, MD   2 years ago Chronic gastritis without bleeding, unspecified gastritis type   [facility] Steele Sizer, MD       Future Appointments             In 1 week Steele Sizer, MD [facility], [place]   In 3 months  [facility]              pantoprazole (PROTONIX) 40 MG tablet 90 tablet 1    Sig: TAKE ONE TABLET BY MOUTH DAILY BEFORE SUPPER      Gastroenterology: Proton Pump Inhibitors Passed - 02/10/2020  6:21 AM      Passed - Valid encounter within last 12 months    Recent Outpatient Visits           5 months ago History of alcoholism [facility])   [facility] Steele Sizer, MD   1 year ago Bipolar affective disorder, currently depressed, mild [facility])   [facility] Steele Sizer, MD   1 year ago Bipolar affective disorder, currently depressed, mild [facility])   [facility] Steele Sizer, MD   1 year ago Bipolar affective disorder, currently depressed, mild [facility])   [facility] [place], Drue Stager, MD   2 years ago Chronic gastritis without bleeding, unspecified gastritis type   [facility] Steele Sizer, MD       Future Appointments             In 1 week Steele Sizer, MD [facility], [place]   In 3 months  [facility], [facility]

## 2020-02-10 NOTE — Telephone Encounter (Signed)
Pt has appt 02/22/20

## 2020-02-11 ENCOUNTER — Other Ambulatory Visit: Payer: Self-pay | Admitting: Family Medicine

## 2020-02-11 DIAGNOSIS — E039 Hypothyroidism, unspecified: Secondary | ICD-10-CM

## 2020-02-11 MED ORDER — LEVOTHYROXINE SODIUM 88 MCG PO TABS: 88.0000 ug | ORAL_TABLET | Freq: Every day | ORAL | 1 refills | Status: DC

## 2020-02-11 NOTE — Addendum Note (Signed)
Addended by: Loistine Chance on: 02/11/2020 06:33 PM   Modules accepted: Orders

## 2020-02-11 NOTE — Telephone Encounter (Signed)
Requested Prescriptions  Pending Prescriptions Disp Refills  . levothyroxine (SYNTHROID) 88 MCG tablet [Pharmacy Med Name: LEVOTHYROXINE 88 MCG TABLET] 90 tablet 0    Sig: TAKE ONE TABLET BY MOUTH EVERY MORNING BEFORE BREAKFAST     Endocrinology:  Hypothyroid Agents Failed - 02/11/2020  6:20 AM      Failed - TSH needs to be rechecked within 3 months after an abnormal result. Refill until TSH is due.      Passed - TSH in normal range and within 360 days    TSH  Date Value Ref Range Status  11/09/2019 2.76 0.40 - 4.50 mIU/L Final         Passed - Valid encounter within last 12 months    Recent Outpatient Visits          5 months ago History of alcoholism [facility])   [facility] Steele Sizer, MD   1 year ago Bipolar affective disorder, currently depressed, mild [facility])   [facility] Steele Sizer, MD   1 year ago Bipolar affective disorder, currently depressed, mild [facility])   [facility] Steele Sizer, MD   1 year ago Bipolar affective disorder, currently depressed, mild [facility])   [facility], Drue Stager, MD   2 years ago Chronic gastritis without bleeding, unspecified gastritis type   [facility] Steele Sizer, MD      Future Appointments            In 1 week Steele Sizer, MD [facility] [place]   In 3 months  [facility], [facility]

## 2020-02-12 ENCOUNTER — Other Ambulatory Visit: Payer: Self-pay | Admitting: Family Medicine

## 2020-02-12 DIAGNOSIS — F3131 Bipolar disorder, current episode depressed, mild: Secondary | ICD-10-CM

## 2020-02-22 ENCOUNTER — Other Ambulatory Visit: Payer: Self-pay

## 2020-02-22 ENCOUNTER — Other Ambulatory Visit: Payer: Self-pay | Admitting: Family Medicine

## 2020-02-22 ENCOUNTER — Encounter: Payer: Self-pay | Admitting: Family Medicine

## 2020-02-22 ENCOUNTER — Ambulatory Visit (INDEPENDENT_AMBULATORY_CARE_PROVIDER_SITE_OTHER): Payer: Medicare HMO | Admitting: Family Medicine

## 2020-02-22 VITALS — BP 112/74 | HR 64 | Temp 97.9°F | Resp 14 | Ht 64.0 in | Wt 140.0 lb

## 2020-02-22 DIAGNOSIS — E039 Hypothyroidism, unspecified: Secondary | ICD-10-CM

## 2020-02-22 DIAGNOSIS — F3131 Bipolar disorder, current episode depressed, mild: Secondary | ICD-10-CM

## 2020-02-22 DIAGNOSIS — E785 Hyperlipidemia, unspecified: Secondary | ICD-10-CM

## 2020-02-22 DIAGNOSIS — Z0000 Encounter for general adult medical examination without abnormal findings: Secondary | ICD-10-CM

## 2020-02-22 DIAGNOSIS — K219 Gastro-esophageal reflux disease without esophagitis: Secondary | ICD-10-CM | POA: Diagnosis not present

## 2020-02-22 DIAGNOSIS — Z981 Arthrodesis status: Secondary | ICD-10-CM | POA: Diagnosis not present

## 2020-02-22 DIAGNOSIS — E2839 Other primary ovarian failure: Secondary | ICD-10-CM

## 2020-02-22 DIAGNOSIS — F5104 Psychophysiologic insomnia: Secondary | ICD-10-CM

## 2020-02-22 DIAGNOSIS — Z1231 Encounter for screening mammogram for malignant neoplasm of breast: Secondary | ICD-10-CM

## 2020-02-22 DIAGNOSIS — F1021 Alcohol dependence, in remission: Secondary | ICD-10-CM

## 2020-02-22 DIAGNOSIS — M542 Cervicalgia: Secondary | ICD-10-CM | POA: Diagnosis not present

## 2020-02-22 DIAGNOSIS — J432 Centrilobular emphysema: Secondary | ICD-10-CM

## 2020-02-22 DIAGNOSIS — R69 Illness, unspecified: Secondary | ICD-10-CM | POA: Diagnosis not present

## 2020-02-22 DIAGNOSIS — K5903 Drug induced constipation: Secondary | ICD-10-CM | POA: Diagnosis not present

## 2020-02-22 DIAGNOSIS — G8929 Other chronic pain: Secondary | ICD-10-CM

## 2020-02-22 DIAGNOSIS — T402X5A Adverse effect of other opioids, initial encounter: Secondary | ICD-10-CM

## 2020-02-22 MED ORDER — QUETIAPINE FUMARATE 25 MG PO TABS: 25.0000 mg | ORAL_TABLET | Freq: Every day | ORAL | 1 refills | Status: DC

## 2020-02-22 MED ORDER — ESCITALOPRAM OXALATE 10 MG PO TABS: ORAL_TABLET | ORAL | 0 refills | Status: DC

## 2020-02-22 MED ORDER — BUSPIRONE HCL 10 MG PO TABS: 10.0000 mg | ORAL_TABLET | Freq: Two times a day (BID) | ORAL | 0 refills | Status: DC
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
## 2020-02-22 NOTE — Patient Instructions (Addendum)
You are due for lung cancer screening, please contact the cancer center about it - [facility]  Get flu and pneumonia 59 at [facility] , she will also get Shingrix   Preventive Care 32-58 Years Old, Female Preventive care refers to visits with your health care provider and lifestyle choices that can promote health and wellness. This includes:  A yearly physical exam. This may also be called an annual well check.  Regular dental visits and eye exams.  Immunizations.  Screening for certain conditions.  Healthy lifestyle choices, such as eating a healthy diet, getting regular exercise, not using drugs or products that contain nicotine and tobacco, and limiting alcohol use. What can I expect for my preventive care visit? Physical exam Your health care provider will check your:  Height and weight. This may be used to calculate body mass index (BMI), which tells if you are at a healthy weight.  Heart rate and blood pressure.  Skin for abnormal spots. Counseling Your health care provider may ask you questions about your:  Alcohol, tobacco, and drug use.  Emotional well-being.  Home and relationship well-being.  Sexual activity.  Eating habits.  Work and work Statistician.  Method of birth control.  Menstrual cycle.  Pregnancy history. What immunizations do I need?  Influenza (flu) vaccine  This is recommended every year. Tetanus, diphtheria, and pertussis (Tdap) vaccine  You may need a Td booster every 10 years. Varicella (chickenpox) vaccine  You may need this if you have not been vaccinated. Zoster (shingles) vaccine  You may need this after age 58. Measles, mumps, and rubella (MMR) vaccine  You may need at least one dose of MMR if you were born in 1957 or later. You may also need a second dose. Pneumococcal conjugate (PCV13) vaccine  You may need this if you have certain conditions and were not previously vaccinated. Pneumococcal polysaccharide  (PPSV23) vaccine  You may need one or two doses if you smoke cigarettes or if you have certain conditions. Meningococcal conjugate (MenACWY) vaccine  You may need this if you have certain conditions. Hepatitis A vaccine  You may need this if you have certain conditions or if you travel or work in places where you may be exposed to hepatitis A. Hepatitis B vaccine  You may need this if you have certain conditions or if you travel or work in places where you may be exposed to hepatitis B. Haemophilus influenzae type b (Hib) vaccine  You may need this if you have certain conditions. Human papillomavirus (HPV) vaccine  If recommended by your health care provider, you may need three doses over 6 months. You may receive vaccines as individual doses or as more than one vaccine together in one shot (combination vaccines). Talk with your health care provider about the risks and benefits of combination vaccines. What tests do I need? Blood tests  Lipid and cholesterol levels. These may be checked every 5 years, or more frequently if you are over 85 years old.  Hepatitis C test.  Hepatitis B test. Screening  Lung cancer screening. You may have this screening every year starting at age 58 if you have a 30-pack-year history of smoking and currently smoke or have quit within the past 15 years.  Colorectal cancer screening. All adults should have this screening starting at age 58 and continuing until age 58. Your health care provider may recommend screening at age 40 if you are at increased risk. You will have tests every 1-10 years, depending  on your results and the type of screening test.  Diabetes screening. This is done by checking your blood sugar (glucose) after you have not eaten for a while (fasting). You may have this done every 1-3 years.  Mammogram. This may be done every 1-2 years. Talk with your health care provider about when you should start having regular mammograms. This may  depend on whether you have a family history of breast cancer.  BRCA-related cancer screening. This may be done if you have a family history of breast, ovarian, tubal, or peritoneal cancers.  Pelvic exam and Pap test. This may be done every 3 years starting at age 58. Starting at age 58, this may be done every 5 years if you have a Pap test in combination with an HPV test. Other tests  Sexually transmitted disease (STD) testing.  Bone density scan. This is done to screen for osteoporosis. You may have this scan if you are at high risk for osteoporosis. Follow these instructions at home: Eating and drinking  Eat a diet that includes fresh fruits and vegetables, whole grains, lean protein, and low-fat dairy.  Take vitamin and mineral supplements as recommended by your health care provider.  Do not drink alcohol if: ? Your health care provider tells you not to drink. ? You are pregnant, may be pregnant, or are planning to become pregnant.  If you drink alcohol: ? Limit how much you have to 0-1 drink a day. ? Be aware of how much alcohol is in your drink. In the [place] one drink equals one 12 oz bottle of beer (355 mL), one 5 oz glass of wine (148 mL), or one 1 oz glass of hard liquor (44 mL). Lifestyle  Take daily care of your teeth and gums.  Stay active. Exercise for at least 30 minutes on 5 or more days each week.  Do not use any products that contain nicotine or tobacco, such as cigarettes, e-cigarettes, and chewing tobacco. If you need help quitting, ask your health care provider.  If you are sexually active, practice safe sex. Use a condom or other form of birth control (contraception) in order to prevent pregnancy and STIs (sexually transmitted infections).  If told by your health care provider, take low-dose aspirin daily starting at age 50. What's next?  Visit your health care provider once a year for a well check visit.  Ask your health care provider how often you should  have your eyes and teeth checked.  Stay up to date on all vaccines. This information is not intended to replace advice given to you by your health care provider. Make sure you discuss any questions you have with your health care provider. Document Revised: 12/25/2017 Document Reviewed: 12/25/2017 Elsevier Patient Education  2020 [employer]  

## 2020-03-28 DIAGNOSIS — M4722 Other spondylosis with radiculopathy, cervical region: Secondary | ICD-10-CM | POA: Diagnosis not present

## 2020-03-28 DIAGNOSIS — M961 Postlaminectomy syndrome, not elsewhere classified: Secondary | ICD-10-CM | POA: Diagnosis not present

## 2020-03-28 DIAGNOSIS — G894 Chronic pain syndrome: Secondary | ICD-10-CM | POA: Diagnosis not present

## 2020-03-28 DIAGNOSIS — Z79891 Long term (current) use of opiate analgesic: Secondary | ICD-10-CM | POA: Diagnosis not present

## 2020-05-03 ENCOUNTER — Encounter: Payer: Self-pay | Admitting: *Deleted

## 2020-05-09 ENCOUNTER — Other Ambulatory Visit: Payer: Self-pay | Admitting: Family Medicine

## 2020-05-09 DIAGNOSIS — F3131 Bipolar disorder, current episode depressed, mild: Secondary | ICD-10-CM

## 2020-05-23 DIAGNOSIS — M961 Postlaminectomy syndrome, not elsewhere classified: Secondary | ICD-10-CM | POA: Diagnosis not present

## 2020-05-23 DIAGNOSIS — Z79891 Long term (current) use of opiate analgesic: Secondary | ICD-10-CM | POA: Diagnosis not present

## 2020-05-23 DIAGNOSIS — M4722 Other spondylosis with radiculopathy, cervical region: Secondary | ICD-10-CM | POA: Diagnosis not present

## 2020-05-23 DIAGNOSIS — G894 Chronic pain syndrome: Secondary | ICD-10-CM | POA: Diagnosis not present

## 2020-05-25 ENCOUNTER — Ambulatory Visit: Payer: Medicare HMO

## 2020-05-30 ENCOUNTER — Ambulatory Visit (INDEPENDENT_AMBULATORY_CARE_PROVIDER_SITE_OTHER): Payer: Self-pay

## 2020-05-30 DIAGNOSIS — Z0000 Encounter for general adult medical examination without abnormal findings: Secondary | ICD-10-CM
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
## 2020-05-30 NOTE — Patient Instructions (Signed)
Anna Giles , Thank you for taking time to come for your Medicare Wellness Visit. I appreciate your ongoing commitment to your health goals. Please review the following plan we discussed and let me know if I can assist you in the future.   Screening recommendations/referrals: Colonoscopy: done  Mammogram: Please call [phone] to schedule your mammogram and bone density screening.   Recommended yearly ophthalmology/optometry visit for glaucoma screening and checkup Recommended yearly dental visit for hygiene and checkup  Vaccinations: Influenza vaccine: declined Pneumococcal vaccine: due at age 59 Tdap vaccine: due Shingles vaccine: Shingrix discussed. Please contact your pharmacy for coverage information.  Covid-19:  Done 08/07/19, 08/31/19 & 03/22/20  Advanced directives: Please bring a copy of your health care power of attorney and living will to the office at your convenience once you have completed those documents.   Conditions/risks identified: Keep up the great work!  Next appointment: Follow up in one year for your annual wellness visit.   Preventive Care 40-64 Years, Female Preventive care refers to lifestyle choices and visits with your health care provider that can promote health and wellness. What does preventive care include?  A yearly physical exam. This is also called an annual well check.  Dental exams once or twice a year.  Routine eye exams. Ask your health care provider how often you should have your eyes checked.  Personal lifestyle choices, including:  Daily care of your teeth and gums.  Regular physical activity.  Eating a healthy diet.  Avoiding tobacco and drug use.  Limiting alcohol use.  Practicing safe sex.  Taking low-dose aspirin daily starting at age 59.  Taking vitamin and mineral supplements as recommended by your health care provider. What happens during an annual well check? The services and screenings done by your health care  provider during your annual well check will depend on your age, overall health, lifestyle risk factors, and family history of disease. Counseling  Your health care provider may ask you questions about your:  Alcohol use.  Tobacco use.  Drug use.  Emotional well-being.  Home and relationship well-being.  Sexual activity.  Eating habits.  Work and work Statistician.  Method of birth control.  Menstrual cycle.  Pregnancy history. Screening  You may have the following tests or measurements:  Height, weight, and BMI.  Blood pressure.  Lipid and cholesterol levels. These may be checked every 5 years, or more frequently if you are over 59 years old.  Skin check.  Lung cancer screening. You may have this screening every year starting at age 33 if you have a 30-pack-year history of smoking and currently smoke or have quit within the past 15 years.  Fecal occult blood test (FOBT) of the stool. You may have this test every year starting at age 59  Flexible sigmoidoscopy or colonoscopy. You may have a sigmoidoscopy every 5 years or a colonoscopy every 10 years starting at age 59.  Hepatitis C blood test.  Hepatitis B blood test.  Sexually transmitted disease (STD) testing.  Diabetes screening. This is done by checking your blood sugar (glucose) after you have not eaten for a while (fasting). You may have this done every 1-3 years.  Mammogram. This may be done every 1-2 years. Talk to your health care provider about when you should start having regular mammograms. This may depend on whether you have a family history of breast cancer.  BRCA-related cancer screening. This may be done if you have a family history of breast, ovarian, tubal,  or peritoneal cancers.  Pelvic exam and Pap test. This may be done every 3 years starting at age 59. Starting at age 59, this may be done every 5 years if you have a Pap test in combination with an HPV test.  Bone density scan. This is done  to screen for osteoporosis. You may have this scan if you are at high risk for osteoporosis. Discuss your test results, treatment options, and if necessary, the need for more tests with your health care provider. Vaccines  Your health care provider may recommend certain vaccines, such as:  Influenza vaccine. This is recommended every year.  Tetanus, diphtheria, and acellular pertussis (Tdap, Td) vaccine. You may need a Td booster every 10 years.  Zoster vaccine. You may need this after age 59.  Pneumococcal 13-valent conjugate (PCV13) vaccine. You may need this if you have certain conditions and were not previously vaccinated.  Pneumococcal polysaccharide (PPSV23) vaccine. You may need one or two doses if you smoke cigarettes or if you have certain conditions. Talk to your health care provider about which screenings and vaccines you need and how often you need them. This information is not intended to replace advice given to you by your health care provider. Make sure you discuss any questions you have with your health care provider. Document Released: 05/12/2015 Document Revised: 01/03/2016 Document Reviewed: 02/14/2015 Elsevier Interactive Patient Education  2017 [employer] Prevention in the Home Falls can cause injuries. They can happen to people of all ages. There are many things you can do to make your home safe and to help prevent falls. What can I do on the outside of my home?  Regularly fix the edges of walkways and driveways and fix any cracks.  Remove anything that might make you trip as you walk through a door, such as a raised step or threshold.  Trim any bushes or trees on the path to your home.  Use bright outdoor lighting.  Clear any walking paths of anything that might make someone trip, such as rocks or tools.  Regularly check to see if handrails are loose or broken. Make sure that both sides of any steps have handrails.  Any raised decks and porches  should have guardrails on the edges.  Have any leaves, snow, or ice cleared regularly.  Use sand or salt on walking paths during winter.  Clean up any spills in your garage right away. This includes oil or grease spills. What can I do in the bathroom?  Use night lights.  Install grab bars by the toilet and in the tub and shower. Do not use towel bars as grab bars.  Use non-skid mats or decals in the tub or shower.  If you need to sit down in the shower, use a plastic, non-slip stool.  Keep the floor dry. Clean up any water that spills on the floor as soon as it happens.  Remove soap buildup in the tub or shower regularly.  Attach bath mats securely with double-sided non-slip rug tape.  Do not have throw rugs and other things on the floor that can make you trip. What can I do in the bedroom?  Use night lights.  Make sure that you have a light by your bed that is easy to reach.  Do not use any sheets or blankets that are too big for your bed. They should not hang down onto the floor.  Have a firm chair that has side arms. You  can use this for support while you get dressed.  Do not have throw rugs and other things on the floor that can make you trip. What can I do in the kitchen?  Clean up any spills right away.  Avoid walking on wet floors.  Keep items that you use a lot in easy-to-reach places.  If you need to reach something above you, use a strong step stool that has a grab bar.  Keep electrical cords out of the way.  Do not use floor polish or wax that makes floors slippery. If you must use wax, use non-skid floor wax.  Do not have throw rugs and other things on the floor that can make you trip. What can I do with my stairs?  Do not leave any items on the stairs.  Make sure that there are handrails on both sides of the stairs and use them. Fix handrails that are broken or loose. Make sure that handrails are as long as the stairways.  Check any carpeting to  make sure that it is firmly attached to the stairs. Fix any carpet that is loose or worn.  Avoid having throw rugs at the top or bottom of the stairs. If you do have throw rugs, attach them to the floor with carpet tape.  Make sure that you have a light switch at the top of the stairs and the bottom of the stairs. If you do not have them, ask someone to add them for you. What else can I do to help prevent falls?  Wear shoes that:  Do not have high heels.  Have rubber bottoms.  Are comfortable and fit you well.  Are closed at the toe. Do not wear sandals.  If you use a stepladder:  Make sure that it is fully opened. Do not climb a closed stepladder.  Make sure that both sides of the stepladder are locked into place.  Ask someone to hold it for you, if possible.  Clearly mark and make sure that you can see:  Any grab bars or handrails.  First and last steps.  Where the edge of each step is.  Use tools that help you move around (mobility aids) if they are needed. These include:  Canes.  Walkers.  Scooters.  Crutches.  Turn on the lights when you go into a dark area. Replace any light bulbs as soon as they burn out.  Set up your furniture so you have a clear path. Avoid moving your furniture around.  If any of your floors are uneven, fix them.  If there are any pets around you, be aware of where they are.  Review your medicines with your doctor. Some medicines can make you feel dizzy. This can increase your chance of falling. Ask your doctor what other things that you can do to help prevent falls. This information is not intended to replace advice given to you by your health care provider. Make sure you discuss any questions you have with your health care provider. Document Released: 02/09/2009 Document Revised: 09/21/2015 Document Reviewed: 05/20/2014 Elsevier Interactive Patient Education  2017 [employer].

## 2020-07-18 DIAGNOSIS — M961 Postlaminectomy syndrome, not elsewhere classified: Secondary | ICD-10-CM | POA: Diagnosis not present

## 2020-07-18 DIAGNOSIS — G894 Chronic pain syndrome: Secondary | ICD-10-CM | POA: Diagnosis not present

## 2020-07-18 DIAGNOSIS — M4722 Other spondylosis with radiculopathy, cervical region: Secondary | ICD-10-CM | POA: Diagnosis not present

## 2020-07-18 DIAGNOSIS — Z79891 Long term (current) use of opiate analgesic: Secondary | ICD-10-CM | POA: Diagnosis not present

## 2020-08-01 ENCOUNTER — Other Ambulatory Visit: Payer: Self-pay | Admitting: Family Medicine

## 2020-08-01 DIAGNOSIS — K2950 Unspecified chronic gastritis without bleeding: Secondary | ICD-10-CM

## 2020-08-01 DIAGNOSIS — K219 Gastro-esophageal reflux disease without esophagitis: Secondary | ICD-10-CM

## 2020-08-01 DIAGNOSIS — E039 Hypothyroidism, unspecified: Secondary | ICD-10-CM

## 2020-08-02 ENCOUNTER — Other Ambulatory Visit: Payer: Self-pay | Admitting: Family Medicine

## 2020-08-02 DIAGNOSIS — F3131 Bipolar disorder, current episode depressed, mild: Secondary | ICD-10-CM

## 2020-08-03 ENCOUNTER — Ambulatory Visit: Payer: Self-pay | Admitting: *Deleted

## 2020-08-03 NOTE — Progress Notes (Signed)
Name: Anna Giles   MRN: [id number]    DOB: 07/03/1960   Date:08/04/2020       Progress Note  Subjective  Chief Complaint  Left eyelid edema/bumps  HPI  Facial pain: she states her comb hit her left upper eye lid over the weekend, on Monday she woke up with a few blister over left eye lid and some discomfort, she applied some neosporin and it crusted over, however over the past couple of days noticed swelling, redness and some peri-auricular tenderness and swelling.  This morning she woke up with redness and puffiness under her left eye.  She was stung by a bee last weekend but it was on left hand. No personal history of allergies or angioedema. She denies nausea, vomiting, change in appetite or fever. She states pain is mild and does not radiate. No visual disturbance.  She takes chronic pain medication and has not been taking anything else for her symptoms   Patient Active Problem List   Diagnosis Date Noted  . S/P cervical spinal fusion 04/02/2019  . History of alcoholism ([place]) 12/28/2018  . Dyslipidemia 07/24/2017  . Skin lesion of back 01/31/2015  . Alveolar aeration decreased 08/30/2014  . Anxiety 08/30/2014  . Bipolar 2 disorder ([place]) 08/30/2014  . Chronic cervical pain 08/30/2014  . Pain in shoulder 08/30/2014  . Blood pressure elevated 08/30/2014  . Fatty liver disease, nonalcoholic 61/44/3154  . Gastritis 08/30/2014  . Adult hypothyroidism 08/30/2014  . Elevation of level of transaminase or lactic acid dehydrogenase (LDH) 08/30/2014    Past Surgical History:  Procedure Laterality Date  . ANTERIOR CERVICAL DECOMP/DISCECTOMY FUSION N/A 04/02/2019   Procedure: ANTERIOR CERVICAL DECOMPRESSION FUSION CERVICAL SIX-SEVEN, REMOVAL OF PLATE.;  Surgeon: Eustace Moore, MD;  Location: [facility];  Service: Neurosurgery;  Laterality: N/A;  anterior  . bladder sugery     Age 28 or 6  . NECK SURGERY  10/01/2005    Family History  Problem Relation Age of Onset  . Depression Mother    . Ulcers Sister   . Breast cancer Paternal Grandmother     Social History   Tobacco Use  . Smoking status: Former Smoker    Packs/day: 1.00    Years: 40.00    Pack years: 40.00    Types: Cigarettes    Quit date: 07/05/2018    Years since quitting: 2.0  . Smokeless tobacco: Never Used  Substance Use Topics  . Alcohol use: Not Currently    Alcohol/week: 0.0 standard drinks    Comment: used to drink heavily , quit in 2007      Current Outpatient Medications:  .  amoxicillin-clavulanate (AUGMENTIN) 875-125 MG tablet, Take 1 tablet by mouth 2 (two) times daily., Disp: 14 tablet, Rfl: 0 .  busPIRone (BUSPAR) 10 MG tablet, TAKE ONE TABLET BY MOUTH TWICE A DAY, Disp: 180 tablet, Rfl: 0 .  Cholecalciferol (VITAMIN D3 PO), Take 1 tablet by mouth daily., Disp: , Rfl:  .  escitalopram (LEXAPRO) 10 MG tablet, TAKE ONE TABLET BY MOUTH EVERY NIGHT AT BEDTIME, Disp: 90 tablet, Rfl: 0 .  levothyroxine (SYNTHROID) 88 MCG tablet, TAKE ONE TABLET BY MOUTH DAILY WITH BREAKFAST, Disp: 90 tablet, Rfl: 0 .  morphine (MS CONTIN) 60 MG 12 hr tablet, Take 60 mg by mouth 3 (three) times daily. *scheduled*, Disp: , Rfl:  .  oxyCODONE-acetaminophen (PERCOCET/ROXICET) 5-325 MG per tablet, Take 1 tablet by mouth 3 (three) times daily. *scheduled*, Disp: , Rfl:  .  pantoprazole (PROTONIX)  40 MG tablet, TAKE ONE TABLET BY MOUTH DAILY BEFORE SUPPER, Disp: 90 tablet, Rfl: 0 .  polyethylene glycol (MIRALAX / GLYCOLAX) 17 g packet, Take 17 g by mouth daily as needed (constipation). , Disp: , Rfl:  .  QUEtiapine (SEROQUEL) 25 MG tablet, Take 1 tablet (25 mg total) by mouth at bedtime., Disp: 90 tablet, Rfl: 1 .  sulfamethoxazole-trimethoprim (BACTRIM DS) 800-160 MG tablet, Take 1 tablet by mouth 2 (two) times daily., Disp: 14 tablet, Rfl: 0 .  tiZANidine (ZANAFLEX) 4 MG tablet, Take 6 mg by mouth 3 (three) times daily. *scheduled*, Disp: , Rfl:   No Known Allergies  I personally reviewed active problem list,  medication list, allergies, family history, social history with the patient/caregiver today.   ROS  Ten systems reviewed and is negative except as mentioned in HPI   Objective  Vitals:   08/04/20 1006  BP: 126/74  Pulse: 77  Resp: 16  Temp: 98.4 F (36.9 C)  TempSrc: Oral  SpO2: 99%  Weight: 141 lb (64 kg)  Height: 5\' 4"  (1.626 m)    Body mass index is 24.2 kg/m.  Physical Exam  Constitutional: Patient appears well-developed and well-nourished. No distress.  HEENT: head atraumatic, normocephalic, pupils equal and reactive to light, ears normal TM, neck supple, not done, Normal extra ocular movements, conjunctiva intact, tender to palpation on left upper lid, some crusting on left upper lid, auricular tender lymphadenopathy Cardiovascular: Normal rate, regular rhythm and normal heart sounds.  No murmur heard. No BLE edema. Pulmonary/Chest: Effort normal and breath sounds normal. No respiratory distress. Abdominal: Soft.  There is no tenderness. Psychiatric: Patient has a normal mood and affect. behavior is normal. Judgment and thought content normal.  PHQ2/9: Depression screen [facility] 2/9 08/04/2020 05/30/2020 02/22/2020 02/22/2020 08/17/2019  Decreased Interest 0 0 0 0 0  Down, Depressed, Hopeless 0 0 0 0 0  PHQ - 2 Score 0 0 0 0 0  Altered sleeping - - 2 0 1  Tired, decreased energy - - 1 0 0  Change in appetite - - 0 0 0  Feeling bad or failure about yourself  - - 0 0 0  Trouble concentrating - - 0 0 0  Moving slowly or fidgety/restless - - 0 0 0  Suicidal thoughts - - 0 0 0  PHQ-9 Score - - 3 0 1  Difficult doing work/chores - - Not difficult at all - Not difficult at all  Some recent data might be hidden    phq 9 is negative   Fall Risk: Fall Risk  08/04/2020 05/30/2020 02/22/2020 08/17/2019 05/21/2019  Falls in the past year? 0 0 0 0 0  Number falls in past yr: 0 0 0 0 0  Comment - - - - -  Injury with Fall? 0 0 0 0 0  Risk for fall due to : - No Fall Risks - - No  Fall Risks  Follow up - Falls prevention discussed - - Falls prevention discussed     Functional Status Survey: Is the patient deaf or have difficulty hearing?: No Does the patient have difficulty seeing, even when wearing glasses/contacts?: No Does the patient have difficulty concentrating, remembering, or making decisions?: No Does the patient have difficulty walking or climbing stairs?: Yes Does the patient have difficulty dressing or bathing?: Yes Does the patient have difficulty doing errands alone such as visiting a doctor's office or shopping?: No    Assessment & Plan  1. Facial cellulitis  -  amoxicillin-clavulanate (AUGMENTIN) 875-125 MG tablet; Take 1 tablet by mouth 2 (two) times daily.  Dispense: 14 tablet; Refill: 0 - sulfamethoxazole-trimethoprim (BACTRIM DS) 800-160 MG tablet; Take 1 tablet by mouth 2 (two) times daily.  Dispense: 14 tablet; Refill: 0  Explained we will cover with dual antibiotic therapy to cover strep and MRSA as recommended on [employer] since she had an abrasion prior to symptoms Advised to go to [facility] if worsening of symptoms in 24 hours or no improvement in 48 hours Also advised to take otc loratadine since it seems puffy under eye - unlikely but possible some angioedema Discussed EOM and headache Advised some otc nsaid's but she states pain clinic prefers if she does not take it   2. Lymphadenopathy, periauricular

## 2020-08-03 NOTE — Telephone Encounter (Signed)
Patient is calling to report she has swelling of left eyelid- she states she has 3 small blisters that have opened. Patient states she has no itching and pain is mild. Patient states the eyelid is "puffy" and she is able to see. Appointment scheduled within disposition- advised if symptoms get worse- UC.  Reason for Disposition . MODERATE-SEVERE eyelid swelling on one side  (Exception: due to a mosquito bite)  Answer Assessment - Initial Assessment Questions 1. ONSET: "When did the swelling start?" (e.g., minutes, hours, days)     3 days ago 2. LOCATION: "What part of the eyelids is swollen?"     Upper Eyelid only- left 3. SEVERITY: "How swollen is it?"     Puffy- patient can see out of eye 4. ITCHING: "Is there any itching?" If Yes, ask: "How much?"   (Scale 1-10; mild, moderate or severe)     No itching 5. PAIN: "Is the swelling painful to touch?" If Yes, ask: "How painful is it?"   (Scale 1-10; mild, moderate or severe)     Yes- mild 6. FEVER: "Do you have a fever?" If Yes, ask: "What is it, how was it measured, and when did it start?"      No fever 7. CAUSE: "What do you think is causing the swelling?"     Unsure- shingles-possible blisters 8. RECURRENT SYMPTOM: "Have you had eyelid swelling before?" If Yes, ask: "When was the last time?" "What happened that time?"     no 9. OTHER SYMPTOMS: "Do you have any other symptoms?" (e.g., blurred vision, eye discharge, rash, runny nose)     Blisters- 3 tiny blisters- did erupt and are open now 10. PREGNANCY: "Is there any chance you are pregnant?" "When was your last menstrual period?"       n/a  Protocols used: EYE - [facility]

## 2020-08-04 ENCOUNTER — Other Ambulatory Visit: Payer: Self-pay | Admitting: Family Medicine

## 2020-08-04 ENCOUNTER — Ambulatory Visit (INDEPENDENT_AMBULATORY_CARE_PROVIDER_SITE_OTHER): Payer: Medicare HMO | Admitting: Family Medicine

## 2020-08-04 ENCOUNTER — Encounter: Payer: Self-pay | Admitting: Family Medicine

## 2020-08-04 ENCOUNTER — Other Ambulatory Visit: Payer: Self-pay

## 2020-08-04 VITALS — BP 126/74 | HR 77 | Temp 98.4°F | Resp 16 | Ht 64.0 in | Wt 141.0 lb

## 2020-08-04 DIAGNOSIS — L03211 Cellulitis of face: Secondary | ICD-10-CM

## 2020-08-04 DIAGNOSIS — R590 Localized enlarged lymph nodes: Secondary | ICD-10-CM | POA: Diagnosis not present

## 2020-08-04 DIAGNOSIS — K219 Gastro-esophageal reflux disease without esophagitis: Secondary | ICD-10-CM

## 2020-08-04 DIAGNOSIS — K2950 Unspecified chronic gastritis without bleeding: Secondary | ICD-10-CM

## 2020-08-04 MED ORDER — SULFAMETHOXAZOLE-TRIMETHOPRIM 800-160 MG PO TABS: 1.0000 | ORAL_TABLET | Freq: Two times a day (BID) | ORAL | 0 refills | Status: DC

## 2020-08-04 MED ORDER — AMOXICILLIN-POT CLAVULANATE 875-125 MG PO TABS: 1.0000 | ORAL_TABLET | Freq: Two times a day (BID) | ORAL | 0 refills | Status: DC

## 2020-08-10 ENCOUNTER — Other Ambulatory Visit: Payer: Self-pay | Admitting: Family Medicine

## 2020-08-10 DIAGNOSIS — E039 Hypothyroidism, unspecified: Secondary | ICD-10-CM

## 2020-08-10 NOTE — Telephone Encounter (Signed)
Requested Prescriptions  Pending Prescriptions Disp Refills  . levothyroxine (SYNTHROID) 88 MCG tablet [Pharmacy Med Name: LEVOTHYROXINE 88 MCG TABLET] 90 tablet 2    Sig: TAKE ONE TABLET BY MOUTH DAILY WITH BREAKFAST     Endocrinology:  Hypothyroid Agents Failed - 08/10/2020  6:20 AM      Failed - TSH needs to be rechecked within 3 months after an abnormal result. Refill until TSH is due.      Passed - TSH in normal range and within 360 days    TSH  Date Value Ref Range Status  11/09/2019 2.76 0.40 - 4.50 mIU/L Final         Passed - Valid encounter within last 12 months    Recent Outpatient Visits          6 days ago Facial cellulitis   [facility] Steele Sizer, MD   5 months ago Centrilobular emphysema [facility])   [facility] Steele Sizer, MD   11 months ago History of alcoholism [facility])   [facility] Steele Sizer, MD   1 year ago Bipolar affective disorder, currently depressed, mild [facility])   [facility] Steele Sizer, MD   2 years ago Bipolar affective disorder, currently depressed, mild [facility])   [facility] Steele Sizer, MD      Future Appointments            In 9 months [facility], [facility]

## 2020-08-17 ENCOUNTER — Other Ambulatory Visit: Payer: Self-pay | Admitting: Family Medicine

## 2020-08-17 DIAGNOSIS — Z1231 Encounter for screening mammogram for malignant neoplasm of breast: Secondary | ICD-10-CM

## 2020-08-18 ENCOUNTER — Telehealth: Payer: Self-pay | Admitting: Family Medicine

## 2020-08-18 DIAGNOSIS — F3131 Bipolar disorder, current episode depressed, mild: Secondary | ICD-10-CM

## 2020-08-18 NOTE — Telephone Encounter (Signed)
Requested medications are due for refill today yes  Requested medications are on the active medication list yes  Last refill 1/25  Last visit 01/2020  Future visit scheduled 05/2021 (one year from [facility] wellness visit)  Notes to clinic Failed protocol due to no valid visit within 6  months.

## 2020-08-21 NOTE — Telephone Encounter (Signed)
Tried calling pt to schedule an appt. Name mentioned on voicemail did not match patients name. No VM was left

## 2020-09-19 DIAGNOSIS — G894 Chronic pain syndrome: Secondary | ICD-10-CM | POA: Diagnosis not present

## 2020-09-19 DIAGNOSIS — M961 Postlaminectomy syndrome, not elsewhere classified: Secondary | ICD-10-CM | POA: Diagnosis not present

## 2020-09-19 DIAGNOSIS — M4722 Other spondylosis with radiculopathy, cervical region: Secondary | ICD-10-CM | POA: Diagnosis not present

## 2020-09-19 DIAGNOSIS — Z79891 Long term (current) use of opiate analgesic: Secondary | ICD-10-CM | POA: Diagnosis not present

## 2020-10-27 ENCOUNTER — Other Ambulatory Visit: Payer: Self-pay | Admitting: Family Medicine

## 2020-10-27 DIAGNOSIS — K219 Gastro-esophageal reflux disease without esophagitis: Secondary | ICD-10-CM

## 2020-10-27 DIAGNOSIS — F3131 Bipolar disorder, current episode depressed, mild: Secondary | ICD-10-CM

## 2020-10-27 DIAGNOSIS — K2950 Unspecified chronic gastritis without bleeding: Secondary | ICD-10-CM

## 2020-11-21 DIAGNOSIS — Z79891 Long term (current) use of opiate analgesic: Secondary | ICD-10-CM | POA: Diagnosis not present

## 2020-11-21 DIAGNOSIS — G894 Chronic pain syndrome: Secondary | ICD-10-CM | POA: Diagnosis not present

## 2020-11-21 DIAGNOSIS — M4722 Other spondylosis with radiculopathy, cervical region: Secondary | ICD-10-CM | POA: Diagnosis not present

## 2020-11-21 DIAGNOSIS — M961 Postlaminectomy syndrome, not elsewhere classified: Secondary | ICD-10-CM | POA: Diagnosis not present

## 2020-12-19 DIAGNOSIS — Z79891 Long term (current) use of opiate analgesic: Secondary | ICD-10-CM | POA: Diagnosis not present

## 2020-12-19 DIAGNOSIS — M961 Postlaminectomy syndrome, not elsewhere classified: Secondary | ICD-10-CM | POA: Diagnosis not present

## 2020-12-19 DIAGNOSIS — G894 Chronic pain syndrome: Secondary | ICD-10-CM | POA: Diagnosis not present

## 2020-12-19 DIAGNOSIS — M4722 Other spondylosis with radiculopathy, cervical region: Secondary | ICD-10-CM | POA: Diagnosis not present

## 2021-01-17 DIAGNOSIS — Z79891 Long term (current) use of opiate analgesic: Secondary | ICD-10-CM | POA: Diagnosis not present

## 2021-01-17 DIAGNOSIS — G894 Chronic pain syndrome: Secondary | ICD-10-CM | POA: Diagnosis not present

## 2021-01-17 DIAGNOSIS — M961 Postlaminectomy syndrome, not elsewhere classified: Secondary | ICD-10-CM | POA: Diagnosis not present

## 2021-01-17 DIAGNOSIS — M4722 Other spondylosis with radiculopathy, cervical region: Secondary | ICD-10-CM | POA: Diagnosis not present

## 2021-01-25 ENCOUNTER — Other Ambulatory Visit: Payer: Self-pay | Admitting: Family Medicine

## 2021-01-25 DIAGNOSIS — F3131 Bipolar disorder, current episode depressed, mild: Secondary | ICD-10-CM

## 2021-01-25 DIAGNOSIS — K2950 Unspecified chronic gastritis without bleeding: Secondary | ICD-10-CM

## 2021-01-25 DIAGNOSIS — K219 Gastro-esophageal reflux disease without esophagitis: Secondary | ICD-10-CM

## 2021-02-08 ENCOUNTER — Other Ambulatory Visit: Payer: Self-pay

## 2021-02-08 ENCOUNTER — Observation Stay
Admission: EM | Admit: 2021-02-08 | Discharge: 2021-02-09 | Disposition: A | Discharge status: Home / Self Care | Payer: Medicare HMO | Attending: Family Medicine | Admitting: Family Medicine

## 2021-02-08 ENCOUNTER — Emergency Department: Payer: Medicare HMO

## 2021-02-08 ENCOUNTER — Inpatient Hospital Stay: Payer: Medicare HMO | Admitting: Anesthesiology

## 2021-02-08 ENCOUNTER — Encounter: Payer: Self-pay | Admitting: Internal Medicine

## 2021-02-08 ENCOUNTER — Encounter
Admission: EM | Disposition: A | Discharge status: Home / Self Care | Payer: Medicare HMO | Source: Home / Self Care | Attending: Emergency Medicine

## 2021-02-08 DIAGNOSIS — Y900 Blood alcohol level of less than 20 mg/100 ml: Secondary | ICD-10-CM | POA: Diagnosis not present

## 2021-02-08 DIAGNOSIS — Z20822 Contact with and (suspected) exposure to covid-19: Secondary | ICD-10-CM | POA: Diagnosis not present

## 2021-02-08 DIAGNOSIS — Z87891 Personal history of nicotine dependence: Secondary | ICD-10-CM | POA: Diagnosis not present

## 2021-02-08 DIAGNOSIS — I10 Essential (primary) hypertension: Secondary | ICD-10-CM | POA: Diagnosis not present

## 2021-02-08 DIAGNOSIS — D649 Anemia, unspecified: Secondary | ICD-10-CM

## 2021-02-08 DIAGNOSIS — R1084 Generalized abdominal pain: Secondary | ICD-10-CM | POA: Diagnosis not present

## 2021-02-08 DIAGNOSIS — Z79899 Other long term (current) drug therapy: Secondary | ICD-10-CM | POA: Diagnosis not present

## 2021-02-08 DIAGNOSIS — R748 Abnormal levels of other serum enzymes: Secondary | ICD-10-CM | POA: Diagnosis not present

## 2021-02-08 DIAGNOSIS — K8050 Calculus of bile duct without cholangitis or cholecystitis without obstruction: Secondary | ICD-10-CM

## 2021-02-08 DIAGNOSIS — Z743 Need for continuous supervision: Secondary | ICD-10-CM | POA: Diagnosis not present

## 2021-02-08 DIAGNOSIS — E039 Hypothyroidism, unspecified: Secondary | ICD-10-CM | POA: Diagnosis not present

## 2021-02-08 DIAGNOSIS — R739 Hyperglycemia, unspecified: Secondary | ICD-10-CM | POA: Insufficient documentation

## 2021-02-08 DIAGNOSIS — K811 Chronic cholecystitis: Secondary | ICD-10-CM | POA: Diagnosis not present

## 2021-02-08 DIAGNOSIS — K810 Acute cholecystitis: Secondary | ICD-10-CM | POA: Diagnosis not present

## 2021-02-08 DIAGNOSIS — K8012 Calculus of gallbladder with acute and chronic cholecystitis without obstruction: Secondary | ICD-10-CM | POA: Diagnosis not present

## 2021-02-08 DIAGNOSIS — R52 Pain, unspecified: Secondary | ICD-10-CM | POA: Diagnosis not present

## 2021-02-08 DIAGNOSIS — D696 Thrombocytopenia, unspecified: Secondary | ICD-10-CM

## 2021-02-08 DIAGNOSIS — K8020 Calculus of gallbladder without cholecystitis without obstruction: Secondary | ICD-10-CM | POA: Diagnosis not present

## 2021-02-08 DIAGNOSIS — K2970 Gastritis, unspecified, without bleeding: Secondary | ICD-10-CM | POA: Diagnosis not present

## 2021-02-08 DIAGNOSIS — K760 Fatty (change of) liver, not elsewhere classified: Secondary | ICD-10-CM | POA: Insufficient documentation

## 2021-02-08 DIAGNOSIS — F3181 Bipolar II disorder: Secondary | ICD-10-CM | POA: Diagnosis present

## 2021-02-08 DIAGNOSIS — R112 Nausea with vomiting, unspecified: Secondary | ICD-10-CM | POA: Diagnosis not present

## 2021-02-08 DIAGNOSIS — R69 Illness, unspecified: Secondary | ICD-10-CM | POA: Diagnosis not present

## 2021-02-08 DIAGNOSIS — K8000 Calculus of gallbladder with acute cholecystitis without obstruction: Secondary | ICD-10-CM | POA: Diagnosis not present

## 2021-02-08 DIAGNOSIS — R1110 Vomiting, unspecified: Secondary | ICD-10-CM | POA: Diagnosis not present

## 2021-02-08 DIAGNOSIS — R1011 Right upper quadrant pain: Secondary | ICD-10-CM | POA: Diagnosis not present

## 2021-02-08 DIAGNOSIS — R109 Unspecified abdominal pain: Secondary | ICD-10-CM | POA: Diagnosis not present

## 2021-02-08 DIAGNOSIS — R7401 Elevation of levels of liver transaminase levels: Secondary | ICD-10-CM

## 2021-02-08 LAB — URINALYSIS, ROUTINE W REFLEX MICROSCOPIC
Bacteria, UA: NONE SEEN
Bilirubin Urine: NEGATIVE
Glucose, UA: NEGATIVE mg/dL
Ketones, ur: 20 mg/dL — AB
Leukocytes,Ua: NEGATIVE
Nitrite: NEGATIVE
Protein, ur: 30 mg/dL — AB
Specific Gravity, Urine: >1.046 — ABNORMAL HIGH (ref 1.005–1.030)
pH: 5 (ref 5.0–8.0)

## 2021-02-08 LAB — CBC WITH DIFFERENTIAL/PLATELET
Abs Immature Granulocytes: 0.02 10*3/uL (ref 0.00–0.07)
Basophils Absolute: 0 10*3/uL (ref 0.0–0.1)
Basophils Relative: 0 %
Eosinophils Absolute: 0 10*3/uL (ref 0.0–0.5)
Eosinophils Relative: 0 %
HCT: 37.7 % (ref 36.0–46.0)
Hemoglobin: 13.2 g/dL (ref 12.0–15.0)
Immature Granulocytes: 0 %
Lymphocytes Relative: 6 %
Lymphs Abs: 0.4 10*3/uL — ABNORMAL LOW (ref 0.7–4.0)
MCH: 30.5 pg (ref 26.0–34.0)
MCHC: 35 g/dL (ref 30.0–36.0)
MCV: 87.1 fL (ref 80.0–100.0)
Monocytes Absolute: 0.1 10*3/uL (ref 0.1–1.0)
Monocytes Relative: 1 %
Neutro Abs: 5.3 10*3/uL (ref 1.7–7.7)
Neutrophils Relative %: 93 %
Platelets: 158 10*3/uL (ref 150–400)
RBC: 4.33 MIL/uL (ref 3.87–5.11)
RDW: 12.6 % (ref 11.5–15.5)
WBC: 5.7 10*3/uL (ref 4.0–10.5)
nRBC: 0 % (ref 0.0–0.2)

## 2021-02-08 LAB — COMPREHENSIVE METABOLIC PANEL
ALT: 555 U/L — ABNORMAL HIGH (ref 0–44)
AST: 982 U/L — ABNORMAL HIGH (ref 15–41)
Albumin: 4.1 g/dL (ref 3.5–5.0)
Alkaline Phosphatase: 104 U/L (ref 38–126)
Anion gap: 12 (ref 5–15)
BUN: 25 mg/dL — ABNORMAL HIGH (ref 6–20)
CO2: 24 mmol/L (ref 22–32)
Calcium: 9.6 mg/dL (ref 8.9–10.3)
Chloride: 100 mmol/L (ref 98–111)
Creatinine, Ser: 0.99 mg/dL (ref 0.44–1.00)
GFR, Estimated: >60 mL/min (ref >60)
Glucose, Bld: 166 mg/dL — ABNORMAL HIGH (ref 70–99)
Potassium: 3.5 mmol/L (ref 3.5–5.1)
Sodium: 136 mmol/L (ref 135–145)
Total Bilirubin: 3.4 mg/dL — ABNORMAL HIGH (ref 0.3–1.2)
Total Protein: 7.9 g/dL (ref 6.5–8.1)

## 2021-02-08 LAB — HEPATITIS PANEL, ACUTE
HCV Ab: NONREACTIVE
Hep A IgM: NONREACTIVE
Hep B C IgM: NONREACTIVE
Hepatitis B Surface Ag: NONREACTIVE

## 2021-02-08 LAB — LACTIC ACID, PLASMA
Lactic Acid, Venous: 2.1 mmol/L (ref 0.5–1.9)
Lactic Acid, Venous: 2 mmol/L (ref 0.5–1.9)

## 2021-02-08 LAB — RESP PANEL BY RT-PCR (FLU A&B, COVID) ARPGX2
Influenza A by PCR: NEGATIVE
Influenza B by PCR: NEGATIVE
SARS Coronavirus 2 by RT PCR: NEGATIVE

## 2021-02-08 LAB — LIPASE, BLOOD: Lipase: 69 U/L — ABNORMAL HIGH (ref 11–51)

## 2021-02-08 LAB — TROPONIN I (HIGH SENSITIVITY): Troponin I (High Sensitivity): 4 ng/L (ref <18)

## 2021-02-08 LAB — PROTIME-INR
INR: 1 (ref 0.8–1.2)
Prothrombin Time: 13.5 seconds (ref 11.4–15.2)

## 2021-02-08 LAB — BILIRUBIN, DIRECT: Bilirubin, Direct: 2 mg/dL — ABNORMAL HIGH (ref 0.0–0.2)

## 2021-02-08 LAB — HEMOGLOBIN A1C
Hgb A1c MFr Bld: 5.5 % (ref 4.8–5.6)
Mean Plasma Glucose: 111 mg/dL

## 2021-02-08 LAB — ETHANOL: Alcohol, Ethyl (B): <10 mg/dL (ref <10)

## 2021-02-08 LAB — ACETAMINOPHEN LEVEL: Acetaminophen (Tylenol), Serum: <10 ug/mL — ABNORMAL LOW (ref 10–30)

## 2021-02-08 LAB — HIV ANTIBODY (ROUTINE TESTING W REFLEX): HIV Screen 4th Generation wRfx: NONREACTIVE

## 2021-02-08 IMAGING — US US ABDOMEN LIMITED
1 series · 14 of 25 positions shown · non-contrast
Comparison: Same day CT abdomen/pelvis, abdominal ultrasound
[DATE]

CLINICAL DATA: Right upper quadrant pain

EXAM:
ULTRASOUND ABDOMEN LIMITED RIGHT UPPER QUADRANT

[Series 1: us abdomen limited ruq (liver/gb) · 14 of 37 slices shown]
[im 1/37]
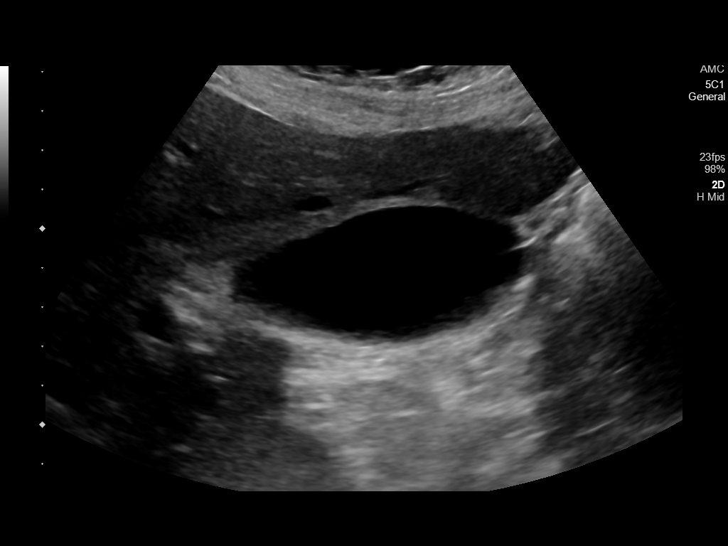
[im 4/37]
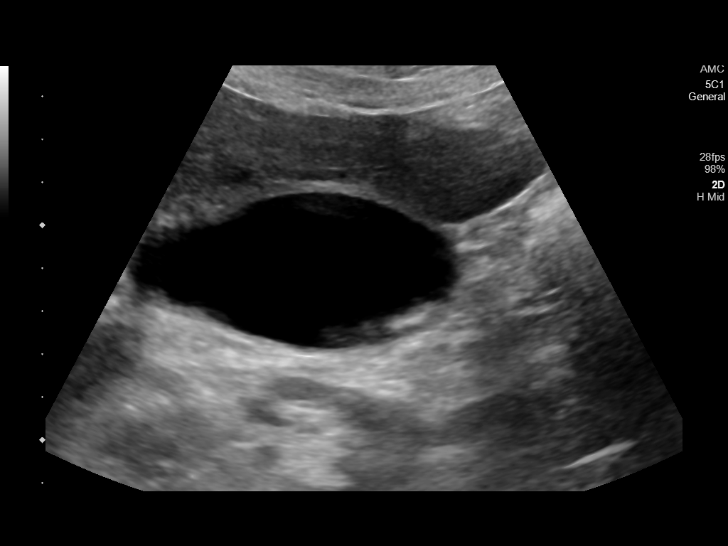
[im 7/37]
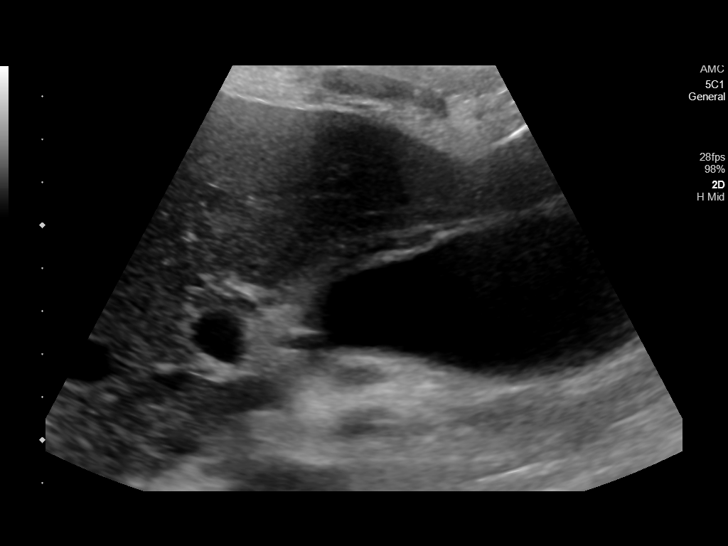
[im 10/37]
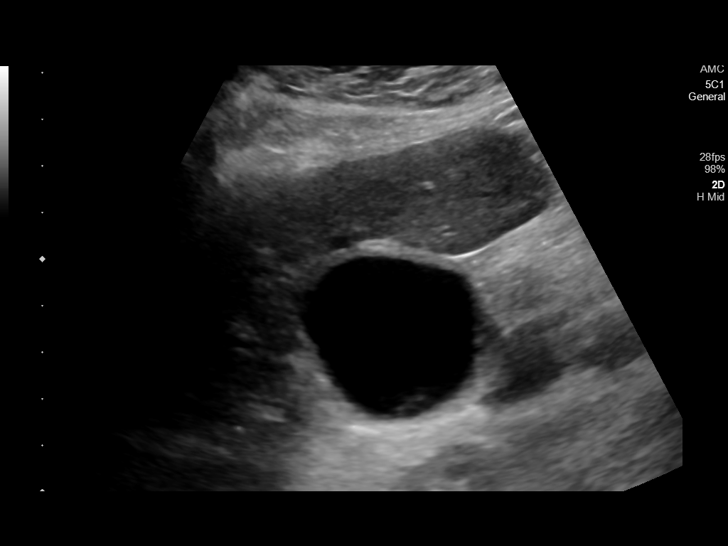
[im 13/37]
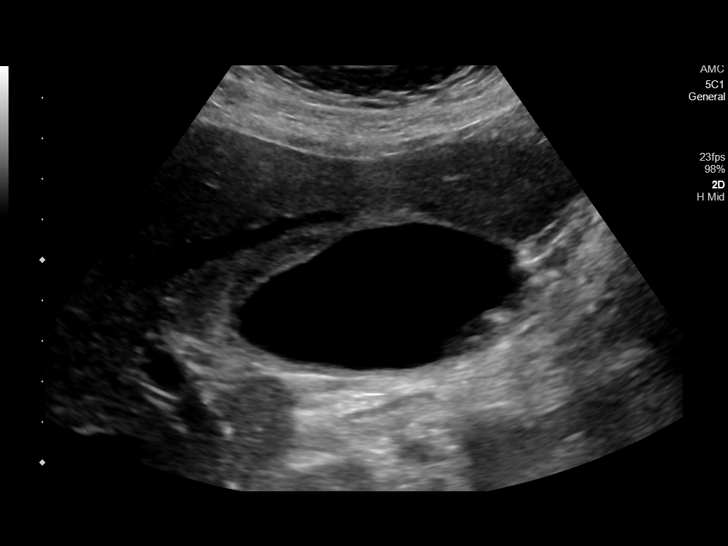
[im 14/37]
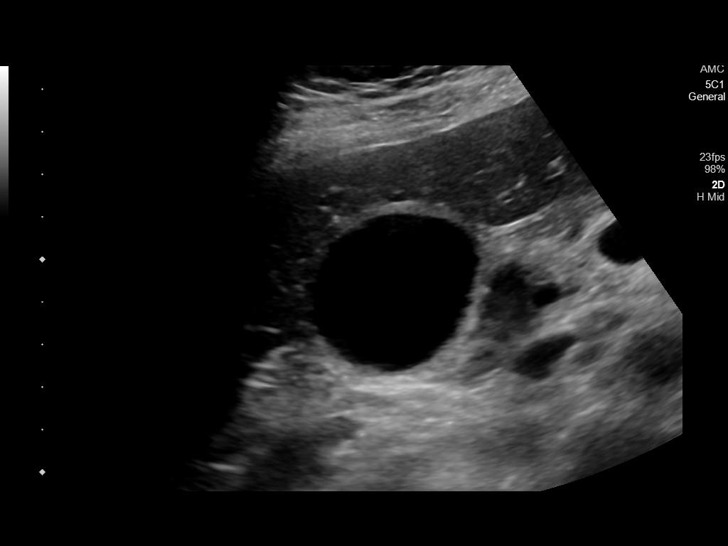
[im 17/37]
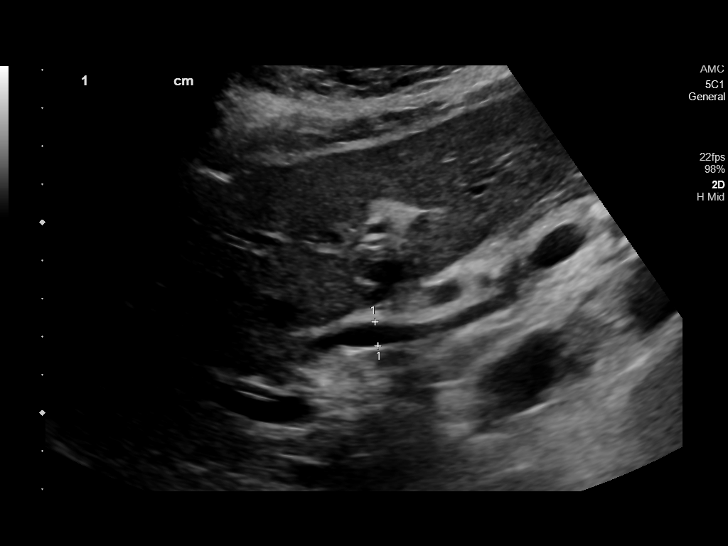
[im 20/37]
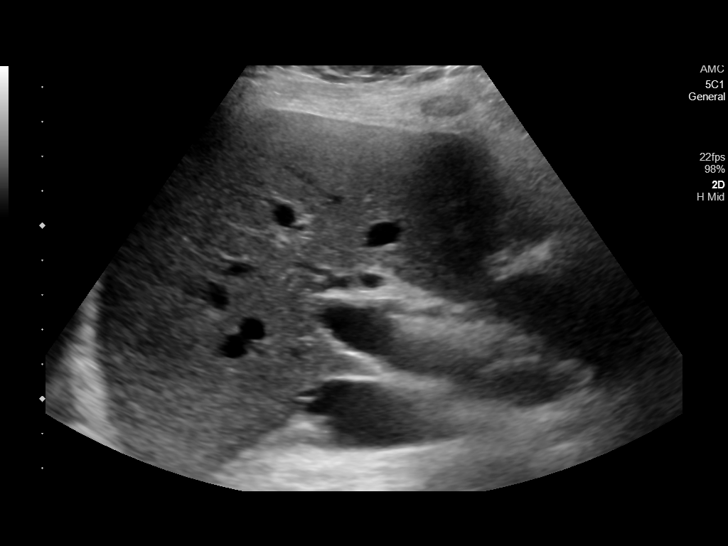
[im 23/37]
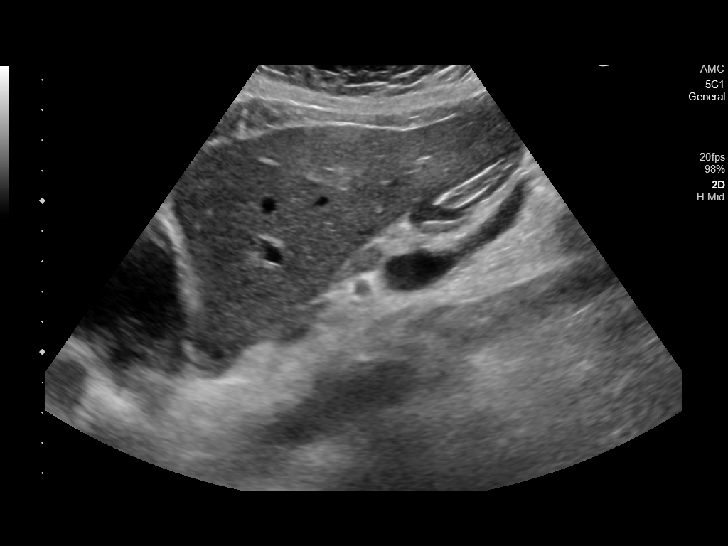
[im 25/37]
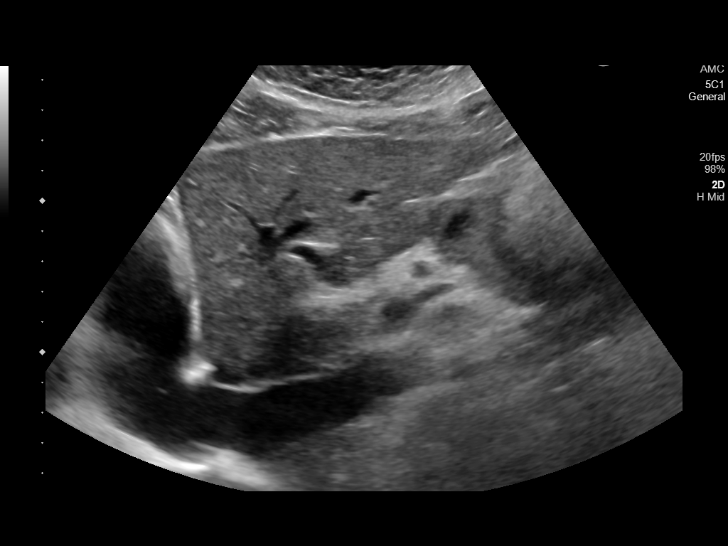
[im 28/37]
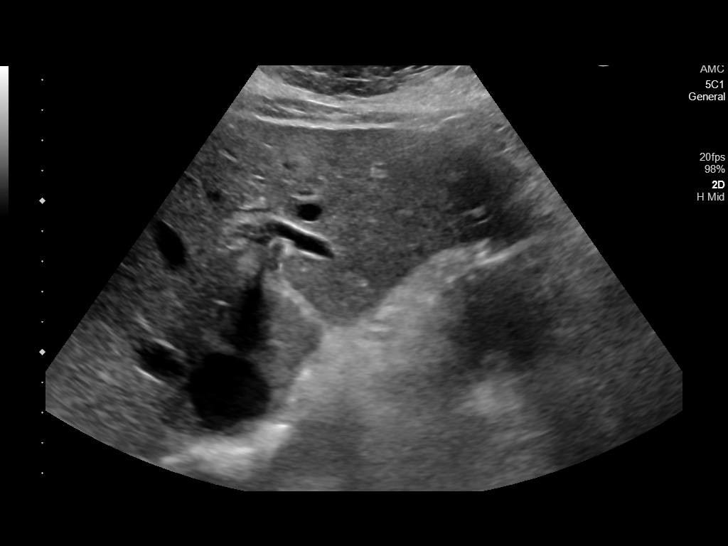
[im 31/37]
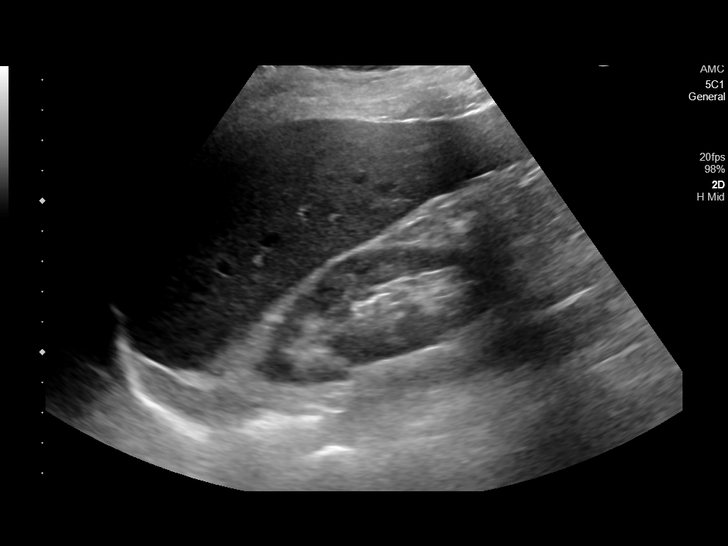
[im 34/37]
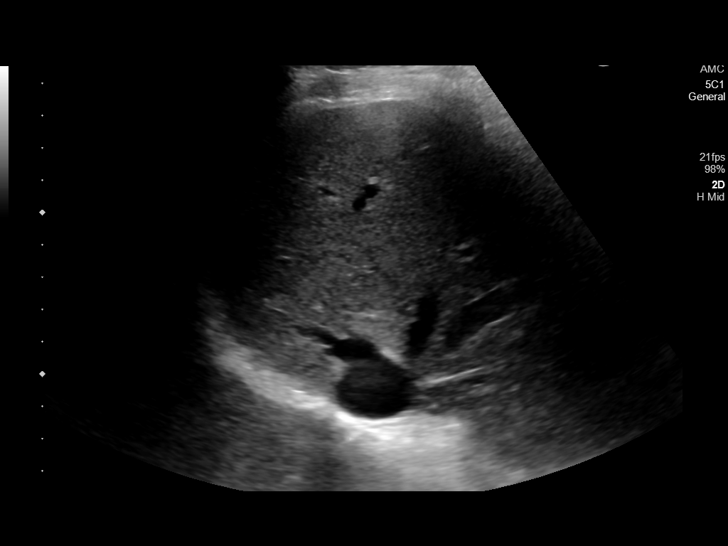
[im 37/37]
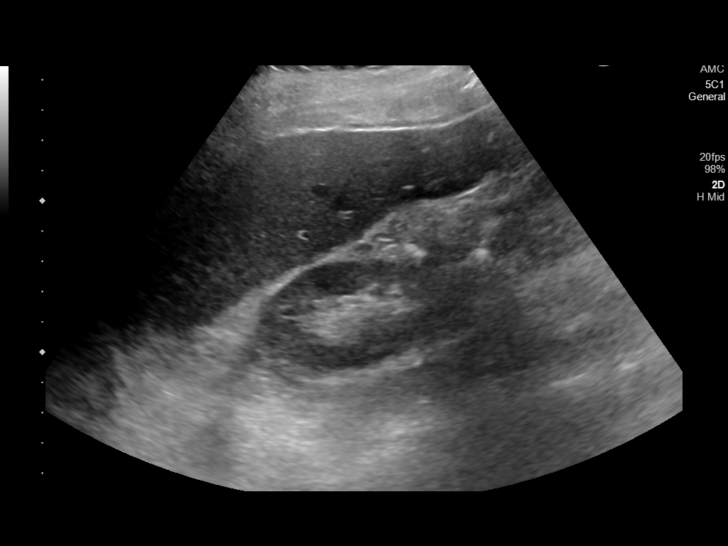

[14 of 25 positions shown; findings below may reference images not displayed]

FINDINGS: Gallbladder:

There are small layering stones and sludge within the gallbladder
lumen. There is mild gallbladder wall thickening measuring up to 5
mm. There is no pericholecystic fluid. Sonographic Murphy sign was
reported to be negative.

Common bile duct:

Diameter: 6 mm. There is mild intrahepatic biliary ductal
dilatation.

Liver:

No focal lesion identified. Within normal limits in parenchymal
echogenicity. Portal vein is patent on color Doppler imaging with
normal direction of blood flow towards the liver.

Other: None.
IMPRESSION: 1. Small layering stones and sludge in the gallbladder lumen with
mild wall thickening. Sonographic Murphy sign was negative. If there
is clinical concern for acute cholecystitis, HIDA scan may be
obtained.
2. Mild intra and extrahepatic biliary ductal dilatation. Correlate
with LFTs. This may be further evaluated with MRCP as indicated.
This could be performed on a nonemergent outpatient basis.

## 2021-02-08 IMAGING — CT CT ABD-PELV W/ CM
2 of 5 series · 15 of 46 positions shown, 17 images · IV contrast (APPLIED)
Comparison: CT scan [DATE]

CLINICAL DATA: Abdominal pain, nausea and vomiting since last
evening.

EXAM:
CT ABDOMEN AND PELVIS WITH CONTRAST
TECHNIQUE: Multidetector CT imaging of the abdomen and pelvis was performed
using the standard protocol following bolus administration of
intravenous contrast.
CONTRAST:  80mL OMNIPAQUE IOHEXOL 350 MG/ML SOLN

[Series 2: routine abd/pel with · axial · 0.73mm/px · z∈[-488,-93]mm · 12 of 89 slices shown, 14 images]
[im 5/89  soft-tissue]
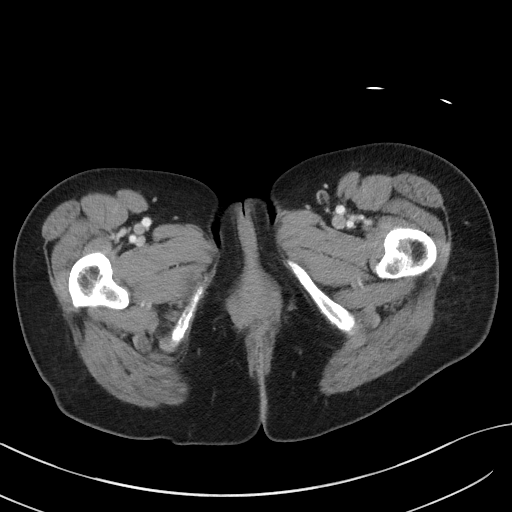
[im 5/89  bone]
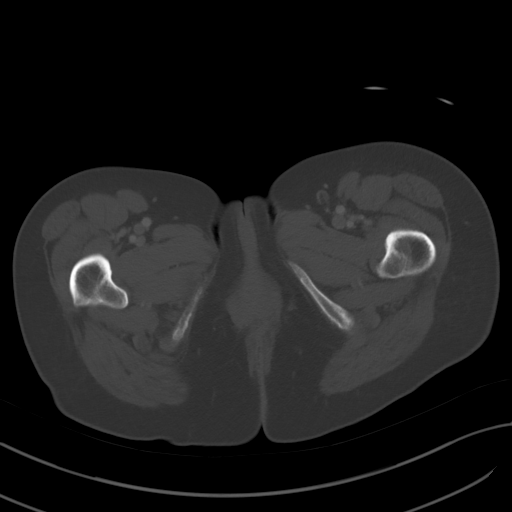
[im 15/89  soft-tissue]
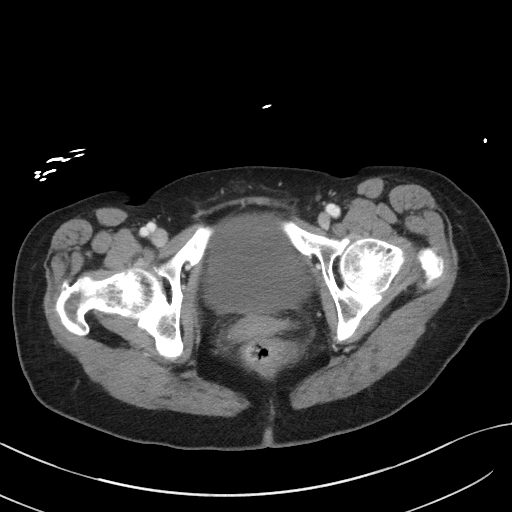
[im 20/89  soft-tissue]
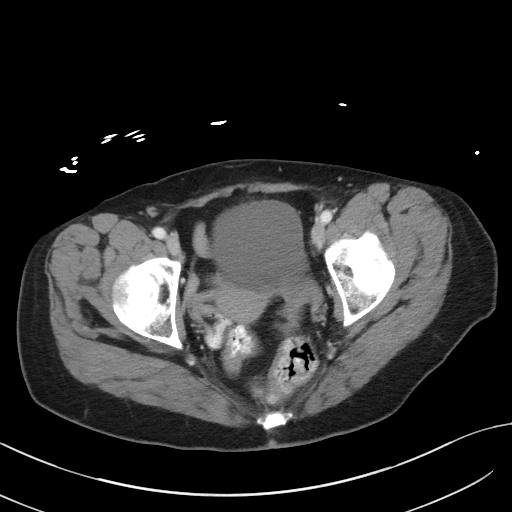
[im 25/89  soft-tissue]
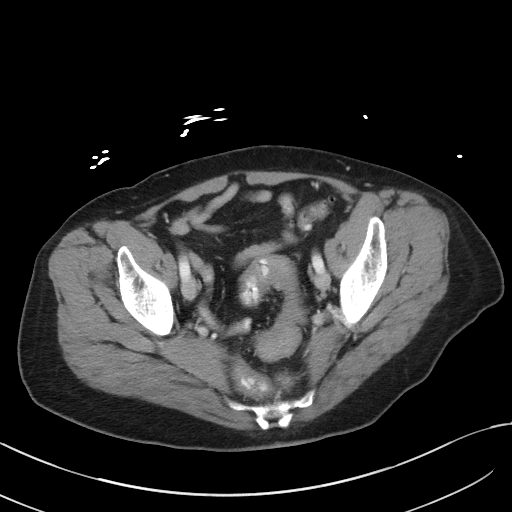
[im 35/89  soft-tissue]
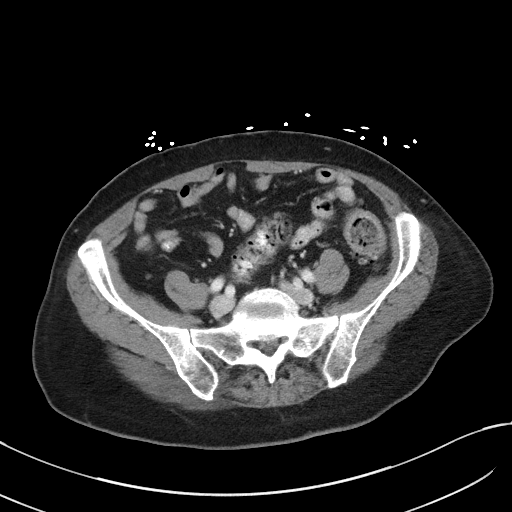
[im 40/89  soft-tissue]
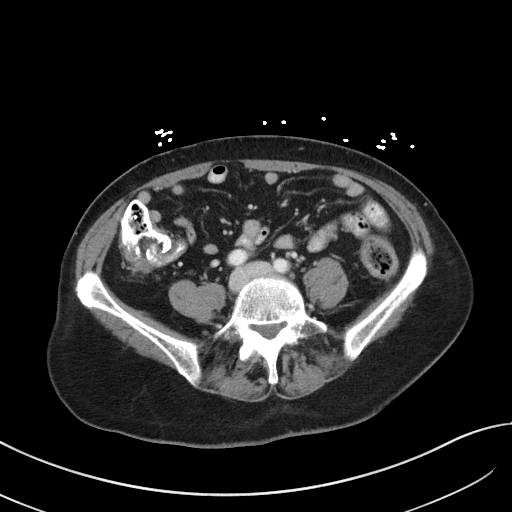
[im 49/89  soft-tissue]
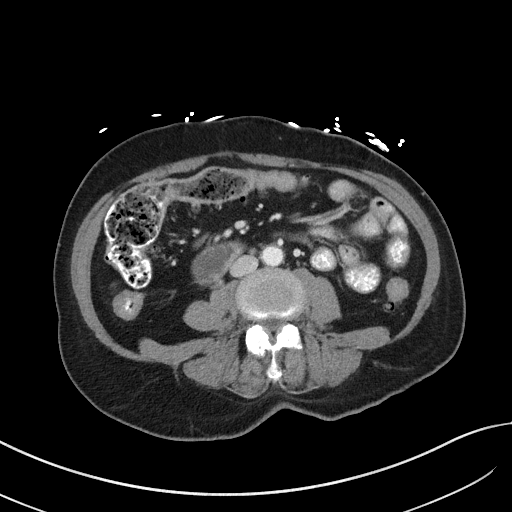
[im 54/89  soft-tissue]
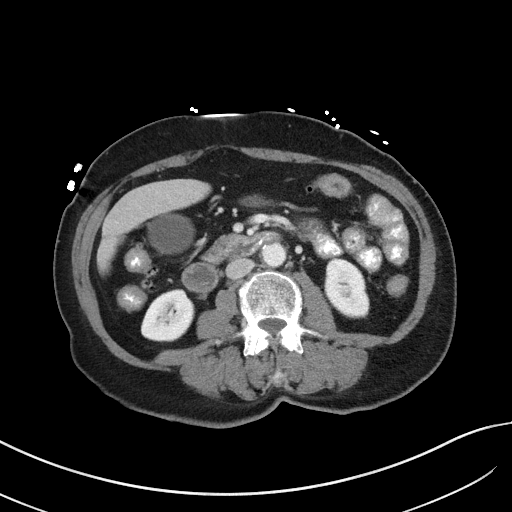
[im 64/89  soft-tissue]
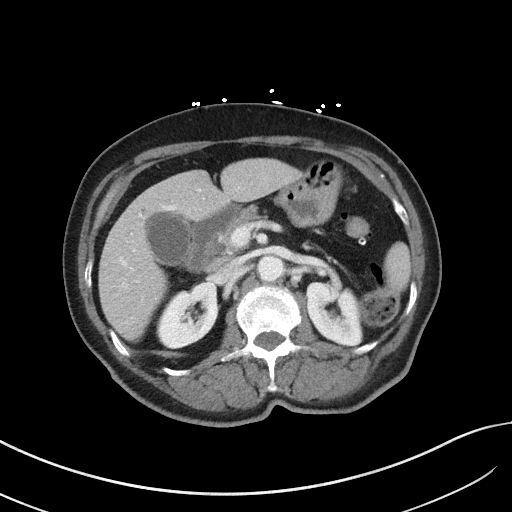
[im 64/89  bone]
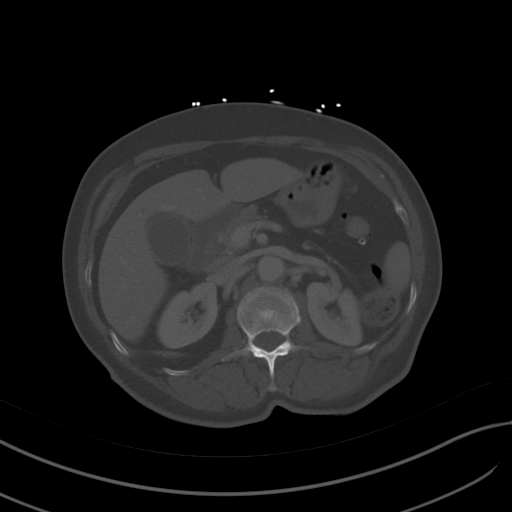
[im 69/89  soft-tissue]
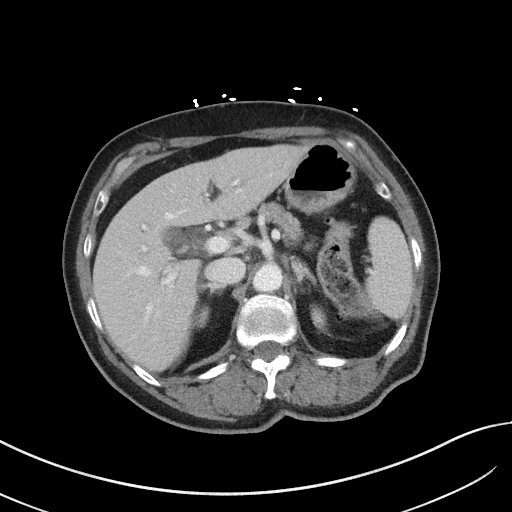
[im 74/89  soft-tissue]
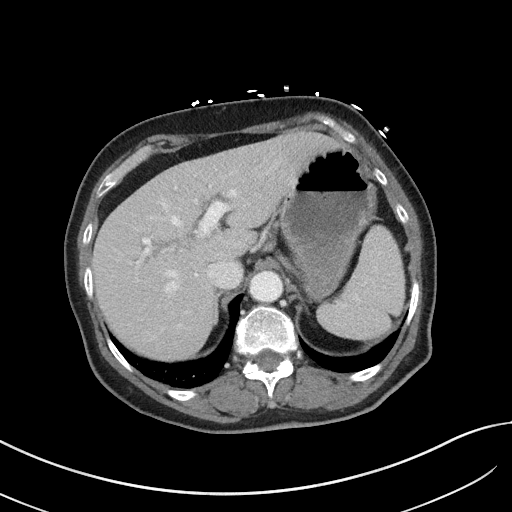
[im 84/89  soft-tissue]
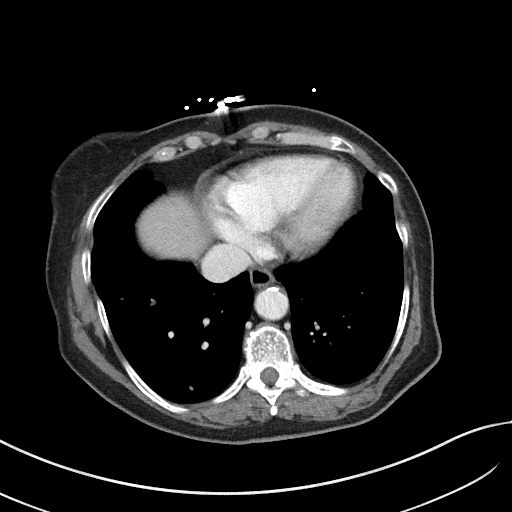

[Series 5: coronal st · coronal · 0.63mm/px · 3 of 82 slices shown]
[im 28/82  soft-tissue]
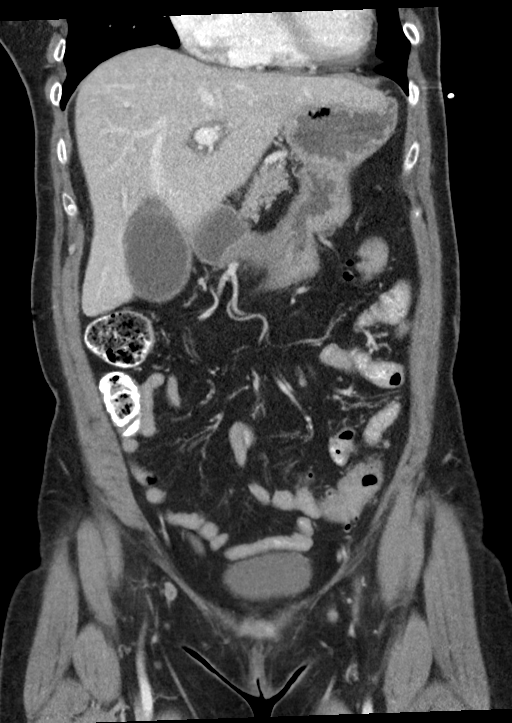
[im 37/82  soft-tissue]
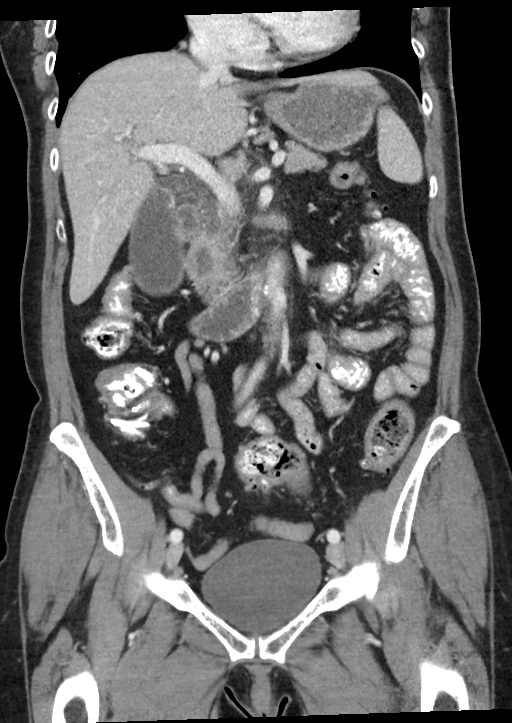
[im 46/82  soft-tissue]
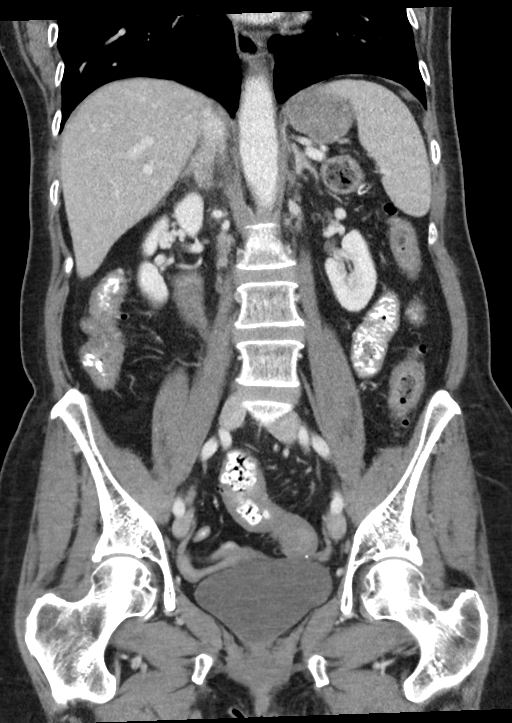

[15 of 46 positions shown; findings below may reference images not displayed]

FINDINGS: Lower chest: The lung bases are clear of acute process. No pleural
effusion or pulmonary lesions. The heart is normal in size. No
pericardial effusion. The distal esophagus and aorta are
unremarkable.

Hepatobiliary: No hepatic lesions are identified. There is mild
intrahepatic biliary dilatation centrally along with mild common
bile duct dilatation measuring 9 mm in the porta hepatis and 8.5 mm
in the head of the pancreas. No obvious gallstones. Slight
enhancement of the gallbladder wall and possible mild gallbladder
wall thickening.

Pancreas: No mass, inflammation or ductal dilatation.

Spleen: Normal size. No focal lesions.

Adrenals/Urinary Tract: Adrenal glands and kidneys are unremarkable.
The bladder is unremarkable.

Stomach/Bowel: The stomach, duodenum, small bowel and colon are
unremarkable. No acute inflammatory process, mass lesion or
obstructive findings. The terminal ileum and appendix are normal.
There is scattered colonic diverticulosis but no findings for acute
diverticulitis.

Vascular/Lymphatic: The aorta is normal in caliber. No dissection.
The branch vessels are patent. The major venous structures are
patent. No mesenteric or retroperitoneal mass or adenopathy. Small
scattered lymph nodes are noted.

Reproductive: The uterus and ovaries are unremarkable.

Other: No pelvic mass or adenopathy. No free pelvic fluid
collections. No inguinal mass or adenopathy. No abdominal wall
hernia or subcutaneous lesions.

Musculoskeletal: No significant bony findings.
IMPRESSION: 1. No abdominal mass lesions or adenopathy.
2. Mild intrahepatic and extrahepatic biliary dilatation and
possible mild gallbladder inflammation. Recommend correlation with
right upper quadrant ultrasound examination.
3. Scattered colonic diverticulosis without findings for acute
diverticulitis.

## 2021-02-08 IMAGING — MR MR 3D RECON AT SCANNER
21 series · 21 of 21 positions shown · IV contrast (gadavist)
Comparison: CT on [DATE], and chest CT on [DATE]

CLINICAL DATA: Severe epigastric and abdominal pain. Nausea and
vomiting. Alcohol misuse. Cholelithiasis

EXAM:
MRI ABDOMEN WITHOUT AND WITH CONTRAST (INCLUDING MRCP)
TECHNIQUE: Multiplanar multisequence MR imaging of the abdomen was performed
both before and after the administration of intravenous contrast.
Heavily T2-weighted images of the biliary and pancreatic ducts were
obtained, and three-dimensional MRCP images were rendered by post
processing.
CONTRAST:  6mL GADAVIST GADOBUTROL 1 MMOL/ML IV SOLN

[Series 3: T2 · coronal · 6.2mm · 1.19mm/px · 1 of 31 slices shown (1 of 2)]
[im 1/31]
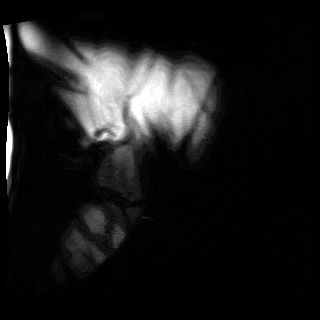

[Series 4: T2 · axial · 6.0mm · 1.19mm/px · 1 of 32 slices shown (2 of 2)]
[im 1/32]
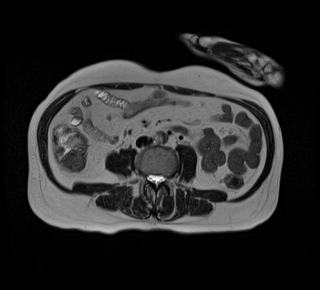

[Series 5: T1 · axial · 3.0mm · 1.19mm/px · 1 of 72 slices shown (1 of 2)]
[im 1/72]
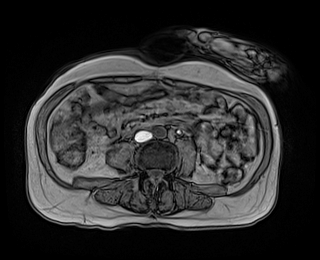

[Series 6: T1 · axial · 3.0mm · 1.19mm/px · 1 of 72 slices shown (2 of 2)]
[im 1/72]
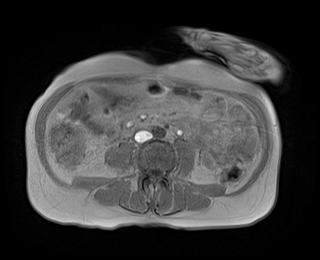

[Series 9: T2 fat-sat · axial · 6.5mm · 1.19mm/px · 1 of 30 slices shown]
[im 1/30]
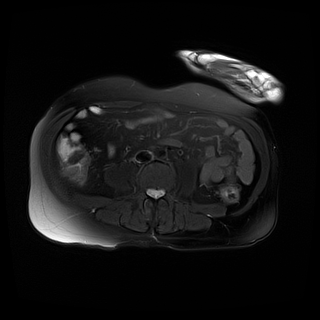

[Series 10: ax dwi_tracew · axial · 6.5mm · 1.42mm/px · 1 of 90 slices shown]
[im 1/90]
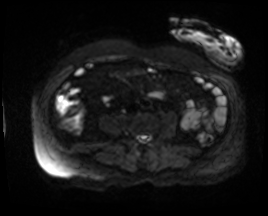

[Series 11: ax dwi_adc · axial · 6.5mm · 1.42mm/px · 1 of 30 slices shown]
[im 1/30]
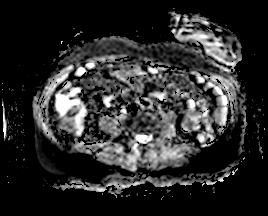

[Series 12: MRCP · coronal · 1.0mm · 0.49mm/px · 1 of 72 slices shown (1 of 2)]
[im 1/72]
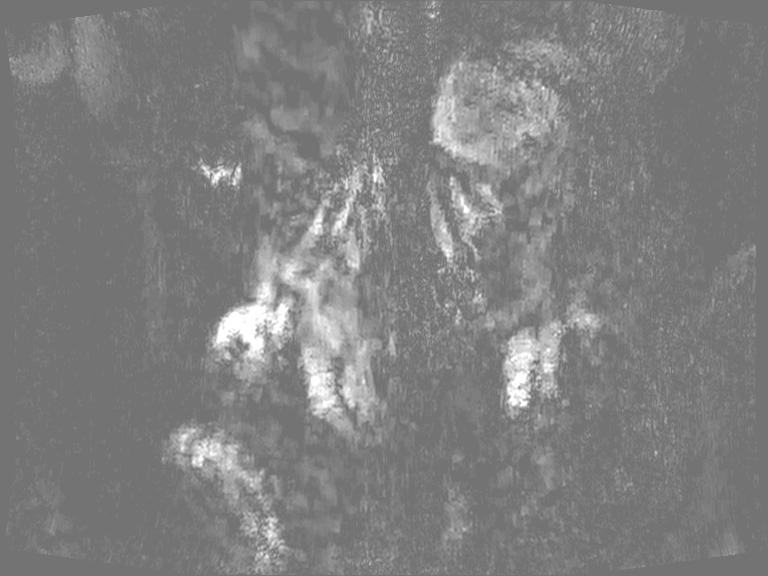

[Series 13: 3d mrcp_mip_radials · oblique · 0.49mm/px · 1 of 19 slices shown]
[im 1/19]
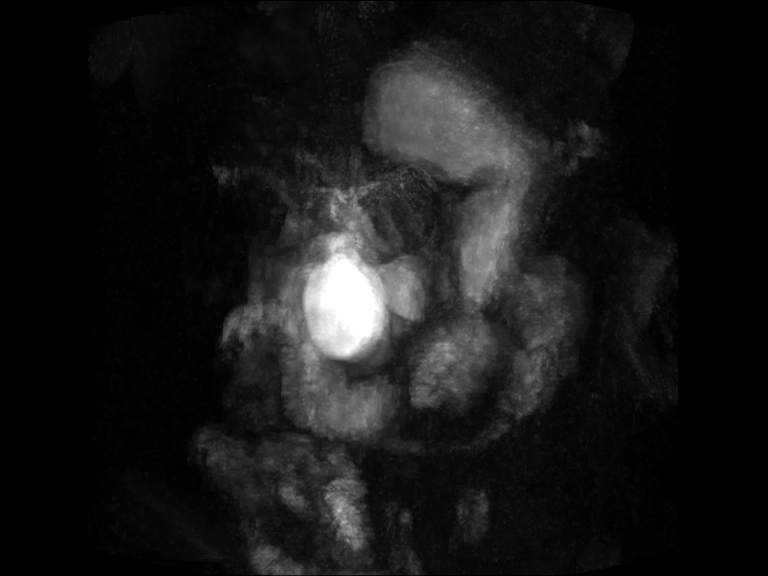

[Series 14: MRCP · coronal · 3.0mm · 1.12mm/px · 1 of 17 slices shown (2 of 2)]
[im 1/17]
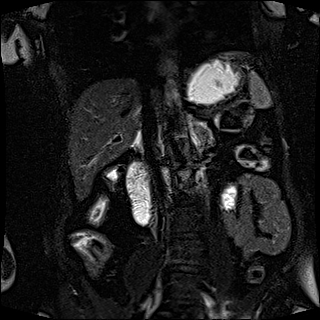

[Series 15: radials · sagittal · 50.0mm · 0.78mm/px · 1 of 5 slices shown]
[im 1/5]
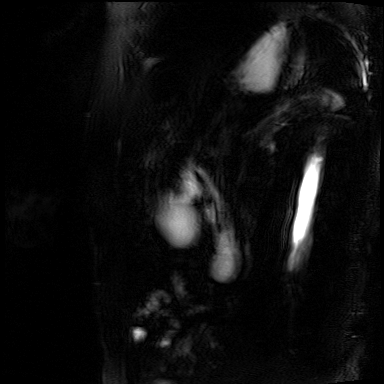

[Series 16: T1 dynamic fat-sat · axial · non-contrast · 3.2mm · 1.19mm/px · 1 of 72 slices shown (1 of 5)]
[im 1/72]
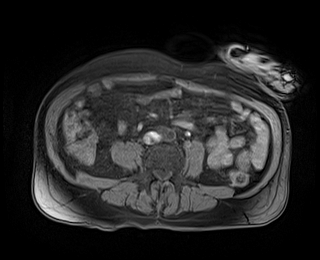

[Series 17: T1 dynamic fat-sat post-contrast · axial · 3.2mm · 1.19mm/px · 1 of 72 slices shown (1 of 4)]
[im 1/72]
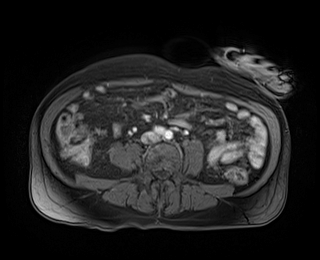

[Series 18: T1 dynamic fat-sat · axial · 3.2mm · 1.19mm/px · 1 of 72 slices shown (2 of 5)]
[im 1/72]
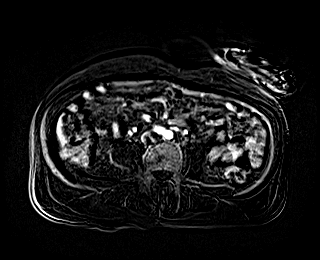

[Series 19: T1 dynamic fat-sat post-contrast · axial · 3.2mm · 1.19mm/px · 1 of 72 slices shown (2 of 4)]
[im 1/72]
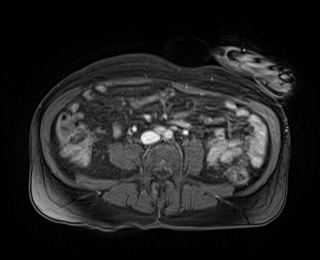

[Series 20: T1 dynamic fat-sat · axial · 3.2mm · 1.19mm/px · 1 of 72 slices shown (3 of 5)]
[im 1/72]
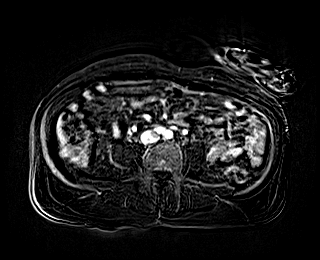

[Series 21: T1 dynamic fat-sat post-contrast · axial · 3.2mm · 1.19mm/px · 1 of 72 slices shown (3 of 4)]
[im 1/72]
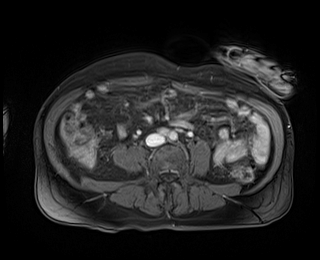

[Series 22: T1 dynamic fat-sat · axial · 3.2mm · 1.19mm/px · 1 of 72 slices shown (4 of 5)]
[im 1/72]
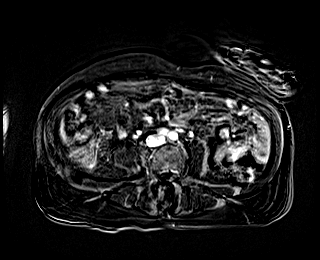

[Series 23: T1 dynamic post-contrast · coronal · 3.0mm · 1.31mm/px · 1 of 72 slices shown]
[im 1/72]
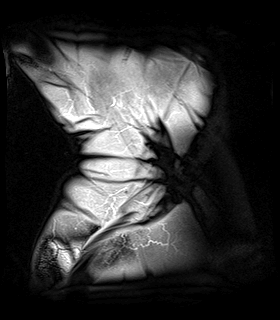

[Series 24: T1 dynamic fat-sat post-contrast · axial · 3.2mm · 1.19mm/px · 1 of 72 slices shown (4 of 4)]
[im 1/72]
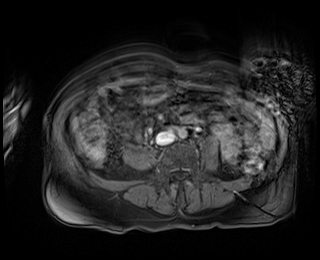

[Series 25: T1 dynamic fat-sat · axial · 3.2mm · 1.19mm/px · 1 of 72 slices shown (5 of 5)]
[im 1/72]
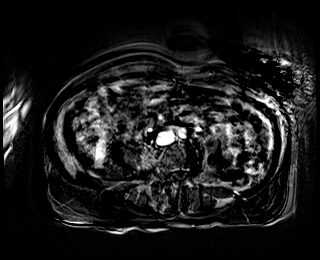

[21 of 21 positions shown; findings below may reference images not displayed]

FINDINGS: Lower chest: Sub-cm T2 hyperintense pulmonary nodule in the
posterior right lung base is stable compared to previous chest CT in
[2G], consistent with benign etiology.

Hepatobiliary: No hepatic masses identified. No evidence of
steatosis. A few tiny less than 1 cm gallstones are noted. No
evidence of gallbladder wall thickening or pericholecystic
inflammatory changes. No evidence of biliary ductal dilatation with
common bile duct measuring 5 mm. No evidence of choledocholithiasis.

Pancreas: No mass or inflammatory changes. No evidence of pancreatic
ductal dilatation.

Spleen:  Within normal limits in size and appearance.

Adrenals/Urinary Tract: No masses identified. No evidence of
hydronephrosis.

Stomach/Bowel: Visualized portion unremarkable.

Vascular/Lymphatic: No pathologically enlarged lymph nodes
identified. No acute vascular findings.

Other:  None.

Musculoskeletal:  No suspicious bone lesions identified.
IMPRESSION: Cholelithiasis. No evidence of acute cholecystitis, biliary ductal
dilatation, or other acute findings.

## 2021-02-08 SURGERY — CHOLECYSTECTOMY, ROBOT-ASSISTED, LAPAROSCOPIC
Anesthesia: General

## 2021-02-08 MED ORDER — ONDANSETRON HCL 4 MG/2ML IJ SOLN: 4.0000 mg | Freq: Four times a day (QID) | INTRAMUSCULAR | Status: DC | PRN

## 2021-02-08 MED ORDER — INDOCYANINE GREEN 25 MG IV SOLR
1.2500 mg | Freq: Once | INTRAVENOUS | Status: AC
  Administered 2021-02-08: 1.25 mg via INTRAVENOUS
  Filled 2021-02-08: qty 0.5

## 2021-02-08 MED ORDER — DEXMEDETOMIDINE (PRECEDEX) IN NS 20 MCG/5ML (4 MCG/ML) IV SYRINGE
PREFILLED_SYRINGE | INTRAVENOUS | Status: DC | PRN
  Administered 2021-02-08: 12 ug via INTRAVENOUS
  Administered 2021-02-08: 4 ug via INTRAVENOUS

## 2021-02-08 MED ORDER — SUGAMMADEX SODIUM 200 MG/2ML IV SOLN
INTRAVENOUS | Status: DC | PRN
  Administered 2021-02-08: 200 mg via INTRAVENOUS

## 2021-02-08 MED ORDER — HYDROMORPHONE HCL 1 MG/ML IJ SOLN
0.5000 mg | INTRAMUSCULAR | Status: DC | PRN
Start: 2021-02-08 — End: 2021-02-09
  Administered 2021-02-08 – 2021-02-09 (×2): 1 mg via INTRAVENOUS
  Filled 2021-02-08 (×2): qty 1

## 2021-02-08 MED ORDER — QUETIAPINE FUMARATE 25 MG PO TABS
25.0000 mg | ORAL_TABLET | Freq: Every day | ORAL | Status: DC
  Administered 2021-02-08: 25 mg via ORAL
  Filled 2021-02-08: qty 1

## 2021-02-08 MED ORDER — BUSPIRONE HCL 10 MG PO TABS
10.0000 mg | ORAL_TABLET | Freq: Two times a day (BID) | ORAL | Status: DC
  Administered 2021-02-08 – 2021-02-09 (×2): 10 mg via ORAL
  Filled 2021-02-08 (×2): qty 1

## 2021-02-08 MED ORDER — ACETAMINOPHEN 10 MG/ML IV SOLN
INTRAVENOUS | Status: DC | PRN
  Administered 2021-02-08: 1000 mg via INTRAVENOUS

## 2021-02-08 MED ORDER — SODIUM CHLORIDE 0.9 % IV SOLN
3.0000 g | Freq: Four times a day (QID) | INTRAVENOUS | Status: DC
  Administered 2021-02-09: 3 g via INTRAVENOUS
  Filled 2021-02-08: qty 8
  Filled 2021-02-08: qty 3
  Filled 2021-02-08 (×2): qty 8

## 2021-02-08 MED ORDER — FENTANYL CITRATE (PF) 100 MCG/2ML IJ SOLN
INTRAMUSCULAR | Status: AC
  Filled 2021-02-08: qty 2

## 2021-02-08 MED ORDER — ENOXAPARIN SODIUM 40 MG/0.4ML IJ SOSY
40.0000 mg | PREFILLED_SYRINGE | INTRAMUSCULAR | Status: DC
  Administered 2021-02-08: 40 mg via SUBCUTANEOUS
  Filled 2021-02-08: qty 0.4

## 2021-02-08 MED ORDER — EPHEDRINE SULFATE 50 MG/ML IJ SOLN
INTRAMUSCULAR | Status: DC | PRN
  Administered 2021-02-08: 10 mg via INTRAVENOUS

## 2021-02-08 MED ORDER — TIZANIDINE HCL 4 MG PO TABS
6.0000 mg | ORAL_TABLET | Freq: Three times a day (TID) | ORAL | Status: DC
  Administered 2021-02-08 – 2021-02-09 (×2): 6 mg via ORAL
  Filled 2021-02-08 (×4): qty 1

## 2021-02-08 MED ORDER — PROPOFOL 10 MG/ML IV BOLUS
INTRAVENOUS | Status: AC
  Filled 2021-02-08: qty 20

## 2021-02-08 MED ORDER — GADOBUTROL 1 MMOL/ML IV SOLN
6.0000 mL | Freq: Once | INTRAVENOUS | Status: AC | PRN
  Administered 2021-02-08: 6 mL via INTRAVENOUS

## 2021-02-08 MED ORDER — PROMETHAZINE HCL 25 MG/ML IJ SOLN: 6.2500 mg | INTRAMUSCULAR | Status: DC | PRN

## 2021-02-08 MED ORDER — LIDOCAINE-EPINEPHRINE (PF) 1 %-1:200000 IJ SOLN
INTRAMUSCULAR | Status: DC | PRN
  Administered 2021-02-08: 25 mL
  Administered 2021-02-08: 20 mL

## 2021-02-08 MED ORDER — ROCURONIUM BROMIDE 10 MG/ML (PF) SYRINGE
PREFILLED_SYRINGE | INTRAVENOUS | Status: AC
  Filled 2021-02-08: qty 10

## 2021-02-08 MED ORDER — DIPHENHYDRAMINE HCL 50 MG/ML IJ SOLN
25.0000 mg | Freq: Once | INTRAMUSCULAR | Status: AC
  Administered 2021-02-08: 25 mg via INTRAVENOUS
  Filled 2021-02-08: qty 1

## 2021-02-08 MED ORDER — 0.9 % SODIUM CHLORIDE (POUR BTL) OPTIME
TOPICAL | Status: DC | PRN
  Administered 2021-02-08: 200 mL

## 2021-02-08 MED ORDER — OXYCODONE HCL 5 MG PO TABS
5.0000 mg | ORAL_TABLET | Freq: Four times a day (QID) | ORAL | Status: DC | PRN
  Administered 2021-02-09: 5 mg via ORAL
  Filled 2021-02-08: qty 1

## 2021-02-08 MED ORDER — ALUM & MAG HYDROXIDE-SIMETH 200-200-20 MG/5ML PO SUSP
30.0000 mL | Freq: Once | ORAL | Status: AC
  Administered 2021-02-08: 30 mL via ORAL
  Filled 2021-02-08: qty 30

## 2021-02-08 MED ORDER — SODIUM CHLORIDE 0.9 % IV BOLUS
1000.0000 mL | Freq: Once | INTRAVENOUS | Status: AC
  Administered 2021-02-08: 1000 mL via INTRAVENOUS

## 2021-02-08 MED ORDER — ESCITALOPRAM OXALATE 10 MG PO TABS
10.0000 mg | ORAL_TABLET | Freq: Every day | ORAL | Status: DC
  Administered 2021-02-08: 10 mg via ORAL
  Filled 2021-02-08 (×3): qty 1

## 2021-02-08 MED ORDER — LIDOCAINE HCL (PF) 2 % IJ SOLN
INTRAMUSCULAR | Status: AC
  Filled 2021-02-08: qty 5

## 2021-02-08 MED ORDER — SEVOFLURANE IN SOLN
RESPIRATORY_TRACT | Status: AC
  Filled 2021-02-08: qty 250

## 2021-02-08 MED ORDER — BISACODYL 5 MG PO TBEC: 5.0000 mg | DELAYED_RELEASE_TABLET | Freq: Every day | ORAL | Status: DC | PRN

## 2021-02-08 MED ORDER — IOHEXOL 350 MG/ML SOLN
80.0000 mL | Freq: Once | INTRAVENOUS | Status: AC | PRN
  Administered 2021-02-08: 80 mL via INTRAVENOUS

## 2021-02-08 MED ORDER — LIDOCAINE-EPINEPHRINE 1 %-1:100000 IJ SOLN
INTRAMUSCULAR | Status: AC
  Filled 2021-02-08: qty 1

## 2021-02-08 MED ORDER — KETOROLAC TROMETHAMINE 30 MG/ML IJ SOLN
INTRAMUSCULAR | Status: AC
  Filled 2021-02-08: qty 1

## 2021-02-08 MED ORDER — DEXAMETHASONE SODIUM PHOSPHATE 10 MG/ML IJ SOLN
INTRAMUSCULAR | Status: DC | PRN
  Administered 2021-02-08: 10 mg via INTRAVENOUS

## 2021-02-08 MED ORDER — LEVOTHYROXINE SODIUM 88 MCG PO TABS
88.0000 ug | ORAL_TABLET | Freq: Every day | ORAL | Status: DC
  Administered 2021-02-09: 88 ug via ORAL
  Filled 2021-02-08 (×2): qty 1

## 2021-02-08 MED ORDER — PANTOPRAZOLE SODIUM 40 MG PO TBEC
40.0000 mg | DELAYED_RELEASE_TABLET | Freq: Every day | ORAL | Status: DC
  Administered 2021-02-09: 40 mg via ORAL
  Filled 2021-02-08: qty 1

## 2021-02-08 MED ORDER — LIDOCAINE VISCOUS HCL 2 % MT SOLN
15.0000 mL | Freq: Once | OROMUCOSAL | Status: AC
  Administered 2021-02-08: 15 mL via ORAL
  Filled 2021-02-08: qty 15

## 2021-02-08 MED ORDER — KETOROLAC TROMETHAMINE 30 MG/ML IJ SOLN
INTRAMUSCULAR | Status: DC | PRN
  Administered 2021-02-08: 30 mg via INTRAVENOUS

## 2021-02-08 MED ORDER — IBUPROFEN 400 MG PO TABS
600.0000 mg | ORAL_TABLET | Freq: Four times a day (QID) | ORAL | Status: DC | PRN
  Administered 2021-02-08: 600 mg via ORAL
  Filled 2021-02-08: qty 1

## 2021-02-08 MED ORDER — FENTANYL CITRATE (PF) 100 MCG/2ML IJ SOLN: 25.0000 ug | INTRAMUSCULAR | Status: DC | PRN

## 2021-02-08 MED ORDER — FENTANYL CITRATE (PF) 100 MCG/2ML IJ SOLN
INTRAMUSCULAR | Status: DC | PRN
  Administered 2021-02-08 (×2): 50 ug via INTRAVENOUS

## 2021-02-08 MED ORDER — ONDANSETRON HCL 4 MG/2ML IJ SOLN
INTRAMUSCULAR | Status: DC | PRN
  Administered 2021-02-08: 4 mg via INTRAVENOUS

## 2021-02-08 MED ORDER — HALOPERIDOL LACTATE 5 MG/ML IJ SOLN
2.5000 mg | Freq: Once | INTRAMUSCULAR | Status: AC
  Administered 2021-02-08: 2.5 mg via INTRAVENOUS
  Filled 2021-02-08: qty 1

## 2021-02-08 MED ORDER — LIDOCAINE HCL (CARDIAC) PF 100 MG/5ML IV SOSY
PREFILLED_SYRINGE | INTRAVENOUS | Status: DC | PRN
  Administered 2021-02-08: 80 mg via INTRAVENOUS

## 2021-02-08 MED ORDER — LACTATED RINGERS IV SOLN: INTRAVENOUS | Status: DC | PRN

## 2021-02-08 MED ORDER — ONDANSETRON HCL 4 MG PO TABS: 4.0000 mg | ORAL_TABLET | Freq: Four times a day (QID) | ORAL | Status: DC | PRN

## 2021-02-08 MED ORDER — ACETAMINOPHEN 10 MG/ML IV SOLN
INTRAVENOUS | Status: AC
  Filled 2021-02-08: qty 100

## 2021-02-08 MED ORDER — SODIUM CHLORIDE 0.9 % IV SOLN: INTRAVENOUS | Status: DC

## 2021-02-08 MED ORDER — ROCURONIUM BROMIDE 100 MG/10ML IV SOLN
INTRAVENOUS | Status: DC | PRN
  Administered 2021-02-08: 50 mg via INTRAVENOUS

## 2021-02-08 MED ORDER — PROPOFOL 10 MG/ML IV BOLUS
INTRAVENOUS | Status: DC | PRN
  Administered 2021-02-08: 120 mg via INTRAVENOUS

## 2021-02-08 MED ORDER — SODIUM CHLORIDE 0.9 % IV SOLN
3.0000 g | Freq: Once | INTRAVENOUS | Status: AC
  Administered 2021-02-08: 3 g via INTRAVENOUS
  Filled 2021-02-08: qty 8

## 2021-02-08 MED ORDER — BUPIVACAINE HCL (PF) 0.5 % IJ SOLN
INTRAMUSCULAR | Status: AC
  Filled 2021-02-08: qty 30

## 2021-02-08 SURGICAL SUPPLY — 51 items
ADH SKN CLS APL DERMABOND .7 (GAUZE/BANDAGES/DRESSINGS) ×1
ANCHOR TIS RET SYS 235ML (MISCELLANEOUS) ×2 IMPLANT
APL PRP STRL LF DISP 70% ISPRP (MISCELLANEOUS) ×1
BAG TISS RTRVL C235 10X14 (MISCELLANEOUS) ×1
BLADE SURG SZ11 CARB STEEL (BLADE) ×2 IMPLANT
CANNULA REDUC XI 12-8 STAPL (CANNULA) ×2
CANNULA REDUCER 12-8 DVNC XI (CANNULA) ×1 IMPLANT
CHLORAPREP W/TINT 26 (MISCELLANEOUS) ×2 IMPLANT
CLIP LIGATING HEMO O LOK GREEN (MISCELLANEOUS) ×2 IMPLANT
DECANTER SPIKE VIAL GLASS SM (MISCELLANEOUS) ×4 IMPLANT
DEFOGGER SCOPE WARMER CLEARIFY (MISCELLANEOUS) ×2 IMPLANT
DERMABOND ADVANCED (GAUZE/BANDAGES/DRESSINGS) ×1
DERMABOND ADVANCED .7 DNX12 (GAUZE/BANDAGES/DRESSINGS) ×1 IMPLANT
DRAPE ARM DVNC X/XI (DISPOSABLE) ×4 IMPLANT
DRAPE COLUMN DVNC XI (DISPOSABLE) ×1 IMPLANT
DRAPE DA VINCI XI ARM (DISPOSABLE) ×8
DRAPE DA VINCI XI COLUMN (DISPOSABLE) ×2
DRSG AQUACEL AG 3.5X4 (GAUZE/BANDAGES/DRESSINGS) ×1 IMPLANT
ELECT CAUTERY BLADE 6.4 (BLADE) ×2 IMPLANT
ELECT REM PT RETURN 9FT ADLT (ELECTROSURGICAL) ×2
ELECTRODE REM PT RTRN 9FT ADLT (ELECTROSURGICAL) ×1 IMPLANT
GAUZE 4X4 16PLY ~~LOC~~+RFID DBL (SPONGE) ×2 IMPLANT
GLOVE SURG SYN 6.5 ES PF (GLOVE) ×4 IMPLANT
GLOVE SURG SYN 6.5 PF PI (GLOVE) ×2 IMPLANT
GLOVE SURG UNDER POLY LF SZ7 (GLOVE) ×4 IMPLANT
GOWN STRL REUS W/ TWL LRG LVL3 (GOWN DISPOSABLE) ×3 IMPLANT
GOWN STRL REUS W/TWL LRG LVL3 (GOWN DISPOSABLE) ×6
LABEL OR SOLS (LABEL) ×2 IMPLANT
MANIFOLD NEPTUNE II (INSTRUMENTS) ×2 IMPLANT
NDL INSUFFLATION 14GA 120MM (NEEDLE) ×1 IMPLANT
NEEDLE HYPO 22GX1.5 SAFETY (NEEDLE) ×2 IMPLANT
NEEDLE INSUFFLATION 14GA 120MM (NEEDLE) ×2 IMPLANT
NS IRRIG 500ML POUR BTL (IV SOLUTION) ×2 IMPLANT
OBTURATOR OPTICAL STANDARD 8MM (TROCAR) ×2
OBTURATOR OPTICAL STND 8 DVNC (TROCAR) ×1
OBTURATOR OPTICALSTD 8 DVNC (TROCAR) ×1 IMPLANT
PACK LAP CHOLECYSTECTOMY (MISCELLANEOUS) ×2 IMPLANT
PENCIL ELECTRO HAND CTR (MISCELLANEOUS) ×2 IMPLANT
SEAL CANN UNIV 5-8 DVNC XI (MISCELLANEOUS) ×3 IMPLANT
SEAL XI 5MM-8MM UNIVERSAL (MISCELLANEOUS) ×6
SET TUBE SMOKE EVAC HIGH FLOW (TUBING) ×2 IMPLANT
SOLUTION ELECTROLUBE (MISCELLANEOUS) ×2 IMPLANT
STAPLER CANNULA SEAL DVNC XI (STAPLE) ×1 IMPLANT
STAPLER CANNULA SEAL XI (STAPLE) ×2
SUT MNCRL 4-0 (SUTURE) ×4
SUT MNCRL 4-0 27XMFL (SUTURE) ×2
SUT VICRYL 0 AB UR-6 (SUTURE) ×2 IMPLANT
SUTURE MNCRL 4-0 27XMF (SUTURE) ×2 IMPLANT
SYR 30ML LL (SYRINGE) IMPLANT
SYSTEM WECK SHIELD CLOSURE (TROCAR) ×1 IMPLANT
WATER STERILE IRR 500ML POUR (IV SOLUTION) ×2 IMPLANT

## 2021-02-08 NOTE — Consult Note (Addendum)
Vonda Antigua, MD [address], [place], [place], [place], [address] [id number], [place], [place], [place], [address] Phone: [phone]  Fax: [phone]  Consultation  Referring Provider:     Dr. Roosevelt Locks Primary Care Physician:  Steele Sizer, MD Reason for Consultation:     Elevated liver enzymes  Date of Admission:  02/08/2021 Date of Consultation:  02/08/2021         HPI:   Anna Giles is a 60 y.o. female presents with 12-hour history of right upper quadrant abdominal pain, constant, dull, nonradiating, 5/10, that has improved since being in the ER and receiving pain medications.  Pain is nonexistent at this time.  Denies any nausea vomiting.  Denies any previous history of similar symptoms.  Liver enzymes were noted to be elevated with elevated transaminases, elevated total bilirubin to 3.4, and normal alkaline phosphatase.  Lipase mildly elevated to 69.  Acute hepatitis panel nonreactive.  CT in the ER showed mild intrahepatic and extrahepatic biliary duct dilatation and possible mild gallbladder inflammation.  Right upper quadrant ultrasound also showed biliary duct dilatation.  MRCP shows cholelithiasis, with no cholecystitis.  No biliary duct dilatation, or choledocholithiasis.  MRCP was done 4 hours after the CT scan on presentation  Past Medical History:  Diagnosis Date   Abnormal exam/test finding    Anxiety    Bilateral shoulder pain    Bipolar disorder (HCC)    Bipolar II disorder (HCC)    Chronic neck pain    Chronic pain of both shoulders    Depression    Encounter to establish care    Fatty liver disease, nonalcoholic    Gastritis    GERD (gastroesophageal reflux disease)    Hypertension    Hypothyroidism    Hypothyroidism, adult    Screening for depression    Transaminitis     Past Surgical History:  Procedure Laterality Date   ANTERIOR CERVICAL DECOMP/DISCECTOMY FUSION N/A 04/02/2019   Procedure: ANTERIOR CERVICAL DECOMPRESSION FUSION  CERVICAL SIX-SEVEN, REMOVAL OF PLATE.;  Surgeon: Eustace Moore, MD;  Location: [facility];  Service: Neurosurgery;  Laterality: N/A;  anterior   bladder sugery     Age 53 or 6   NECK SURGERY  10/01/2005    Prior to Admission medications   Medication Sig Start Date End Date Taking? Authorizing Provider  amoxicillin-clavulanate (AUGMENTIN) 875-125 MG tablet Take 1 tablet by mouth 2 (two) times daily. 08/04/20   Steele Sizer, MD  busPIRone (BUSPAR) 10 MG tablet TAKE ONE TABLET BY MOUTH TWICE A DAY 01/25/21   Steele Sizer, MD  Cholecalciferol (VITAMIN D3 PO) Take 1 tablet by mouth daily.    [provider]  escitalopram (LEXAPRO) 10 MG tablet TAKE ONE TABLET BY MOUTH EVERY NIGHT AT BEDTIME 08/20/20   Steele Sizer, MD  levothyroxine (SYNTHROID) 88 MCG tablet TAKE ONE TABLET BY MOUTH DAILY WITH BREAKFAST 08/10/20   Steele Sizer, MD  morphine (MS CONTIN) 60 MG 12 hr tablet Take 60 mg by mouth 3 (three) times daily. *scheduledHardin Negus, Elta Guadeloupe, MD  morphine (MSIR) 15 MG tablet Take 15 mg by mouth every 6 (six) hours as needed. 01/17/21   [provider]  oxyCODONE (OXY IR/ROXICODONE) 5 MG immediate release tablet Take 5 mg by mouth 4 (four) times daily as needed. 09/19/20   [provider]  oxyCODONE-acetaminophen (PERCOCET/ROXICET) 5-325 MG per tablet Take 1 tablet by mouth 3 (three) times daily. *scheduledNicholaus Bloom, MD  pantoprazole (PROTONIX) 40 MG tablet TAKE 1 TABLET BY MOUTH DAILY BEFORE SUPPER. 01/25/21   Ancil Boozer, Drue Stager, MD  polyethylene glycol (MIRALAX / GLYCOLAX) 17 g packet Take 17 g by mouth daily as needed (constipation).     [provider]  QUEtiapine (SEROQUEL) 25 MG tablet Take 1 tablet (25 mg total) by mouth at bedtime. 02/22/20   Steele Sizer, MD  sulfamethoxazole-trimethoprim (BACTRIM DS) 800-160 MG tablet Take 1 tablet by mouth 2 (two) times daily. 08/04/20   Steele Sizer, MD  tiZANidine (ZANAFLEX) 4 MG tablet Take 6 mg by mouth 3  (three) times daily. *scheduledNicholaus Bloom, MD    Family History  Problem Relation Age of Onset   Depression Mother    Ulcers Sister    Breast cancer Paternal Grandmother      Social History   Tobacco Use   Smoking status: Former    Packs/day: 1.00    Years: 40.00    Pack years: 40.00    Types: Cigarettes    Quit date: 07/05/2018    Years since quitting: 2.6   Smokeless tobacco: Never  Vaping Use   Vaping Use: Every day   Start date: 07/03/2018   Substances: Flavoring  Substance Use Topics   Alcohol use: Not Currently    Alcohol/week: 0.0 standard drinks    Comment: used to drink heavily , quit in 2007    Drug use: No    Allergies as of 02/08/2021   (No Known Allergies)    Review of Systems:    All systems reviewed and negative except where noted in HPI.   Physical Exam:  Constitutional: General:   Alert,  Well-developed, well-nourished, pleasant and cooperative in NAD BP 96/66 (BP Location: Left Arm)   Pulse 79   Temp 99.8 F (37.7 C) (Oral)   Resp 20   Ht 5\' 4"  (1.626 m)   Wt 61.2 kg   SpO2 96%   BMI 23.17 kg/m   Eyes:  Sclera clear, no icterus.   Conjunctiva pink. PERRLA  Ears:  No scars, lesions or masses, Normal auditory acuity. Nose:  No deformity, discharge, or lesions. Mouth:  No deformity or lesions, oropharynx pink & moist.  Neck:  Supple; no masses or thyromegaly.  Respiratory: Normal respiratory effort, Normal percussion  Gastrointestinal:  Normal bowel sounds.  No bruits.  Soft, non-tender and non-distended without masses, hepatosplenomegaly or hernias noted.  No guarding or rebound tenderness.     Cardiac: No clubbing or edema.  No cyanosis. Normal posterior tibial pedal pulses noted.  Lymphatic:  No significant cervical or axillary adenopathy.  Psych:  Alert and cooperative. Normal mood and affect.  Musculoskeletal:  Normal gait. Head normocephalic, atraumatic. Symmetrical without gross deformities. 5/5 Upper and Lower  extremity strength bilaterally.  Skin: Warm. Intact without significant lesions or rashes. No jaundice.  Neurologic:  Face symmetrical, tongue midline, Normal sensation to touch;  grossly normal neurologically.  Psych:  Alert and oriented x3, Alert and cooperative. Normal mood and affect.   LAB RESULTS: Recent Labs    02/08/21 0453  WBC 5.7  HGB 13.2  HCT 37.7  PLT 158   BMET Recent Labs    02/08/21 0453  NA 136  K 3.5  CL 100  CO2 24  GLUCOSE 166*  BUN 25*  CREATININE 0.99  CALCIUM 9.6   LFT Recent Labs    02/08/21 0453 02/08/21 0531  PROT 7.9  --   ALBUMIN 4.1  --   AST  982*  --   ALT 555*  --   ALKPHOS 104  --   BILITOT 3.4*  --   BILIDIR  --  2.0*   PT/INR Recent Labs    02/08/21 0531  LABPROT 13.5  INR 1.0    STUDIES: CT ABDOMEN PELVIS W CONTRAST  Result Date: 02/08/2021 CLINICAL DATA:  Abdominal pain, nausea and vomiting since last evening. EXAM: CT ABDOMEN AND PELVIS WITH CONTRAST TECHNIQUE: Multidetector CT imaging of the abdomen and pelvis was performed using the standard protocol following bolus administration of intravenous contrast. CONTRAST:  3mL OMNIPAQUE IOHEXOL 350 MG/ML SOLN COMPARISON:  CT scan 02/21/2011 FINDINGS: Lower chest: The lung bases are clear of acute process. No pleural effusion or pulmonary lesions. The heart is normal in size. No pericardial effusion. The distal esophagus and aorta are unremarkable. Hepatobiliary: No hepatic lesions are identified. There is mild intrahepatic biliary dilatation centrally along with mild common bile duct dilatation measuring 9 mm in the porta hepatis and 8.5 mm in the head of the pancreas. No obvious gallstones. Slight enhancement of the gallbladder wall and possible mild gallbladder wall thickening. Pancreas: No mass, inflammation or ductal dilatation. Spleen: Normal size. No focal lesions. Adrenals/Urinary Tract: Adrenal glands and kidneys are unremarkable. The bladder is unremarkable.  Stomach/Bowel: The stomach, duodenum, small bowel and colon are unremarkable. No acute inflammatory process, mass lesion or obstructive findings. The terminal ileum and appendix are normal. There is scattered colonic diverticulosis but no findings for acute diverticulitis. Vascular/Lymphatic: The aorta is normal in caliber. No dissection. The branch vessels are patent. The major venous structures are patent. No mesenteric or retroperitoneal mass or adenopathy. Small scattered lymph nodes are noted. Reproductive: The uterus and ovaries are unremarkable. Other: No pelvic mass or adenopathy. No free pelvic fluid collections. No inguinal mass or adenopathy. No abdominal wall hernia or subcutaneous lesions. Musculoskeletal: No significant bony findings. IMPRESSION: 1. No abdominal mass lesions or adenopathy. 2. Mild intrahepatic and extrahepatic biliary dilatation and possible mild gallbladder inflammation. Recommend correlation with right upper quadrant ultrasound examination. 3. Scattered colonic diverticulosis without findings for acute diverticulitis. Electronically Signed   By: Marijo Sanes M.D.   On: 02/08/2021 06:52   MR 3D Recon At Scanner  Result Date: 02/08/2021 CLINICAL DATA:  Severe epigastric and abdominal pain. Nausea and vomiting. Alcohol misuse. Cholelithiasis EXAM: MRI ABDOMEN WITHOUT AND WITH CONTRAST (INCLUDING MRCP) TECHNIQUE: Multiplanar multisequence MR imaging of the abdomen was performed both before and after the administration of intravenous contrast. Heavily T2-weighted images of the biliary and pancreatic ducts were obtained, and three-dimensional MRCP images were rendered by post processing. CONTRAST:  66mL GADAVIST GADOBUTROL 1 MMOL/ML IV SOLN COMPARISON:  CT on 02/08/2021, and chest CT on 01/05/2019 FINDINGS: Lower chest: Sub-cm T2 hyperintense pulmonary nodule in the posterior right lung base is stable compared to previous chest CT in 2020, consistent with benign etiology.  Hepatobiliary: No hepatic masses identified. No evidence of steatosis. A few tiny less than 1 cm gallstones are noted. No evidence of gallbladder wall thickening or pericholecystic inflammatory changes. No evidence of biliary ductal dilatation with common bile duct measuring 5 mm. No evidence of choledocholithiasis. Pancreas: No mass or inflammatory changes. No evidence of pancreatic ductal dilatation. Spleen:  Within normal limits in size and appearance. Adrenals/Urinary Tract: No masses identified. No evidence of hydronephrosis. Stomach/Bowel: Visualized portion unremarkable. Vascular/Lymphatic: No pathologically enlarged lymph nodes identified. No acute vascular findings. Other:  None. Musculoskeletal:  No suspicious bone lesions identified. IMPRESSION: Cholelithiasis. No evidence  of acute cholecystitis, biliary ductal dilatation, or other acute findings. Electronically Signed   By: Marlaine Hind M.D.   On: 02/08/2021 11:23   MR ABDOMEN MRCP W WO CONTAST  Result Date: 02/08/2021 CLINICAL DATA:  Severe epigastric and abdominal pain. Nausea and vomiting. Alcohol misuse. Cholelithiasis EXAM: MRI ABDOMEN WITHOUT AND WITH CONTRAST (INCLUDING MRCP) TECHNIQUE: Multiplanar multisequence MR imaging of the abdomen was performed both before and after the administration of intravenous contrast. Heavily T2-weighted images of the biliary and pancreatic ducts were obtained, and three-dimensional MRCP images were rendered by post processing. CONTRAST:  56mL GADAVIST GADOBUTROL 1 MMOL/ML IV SOLN COMPARISON:  CT on 02/08/2021, and chest CT on 01/05/2019 FINDINGS: Lower chest: Sub-cm T2 hyperintense pulmonary nodule in the posterior right lung base is stable compared to previous chest CT in 2020, consistent with benign etiology. Hepatobiliary: No hepatic masses identified. No evidence of steatosis. A few tiny less than 1 cm gallstones are noted. No evidence of gallbladder wall thickening or pericholecystic inflammatory changes.  No evidence of biliary ductal dilatation with common bile duct measuring 5 mm. No evidence of choledocholithiasis. Pancreas: No mass or inflammatory changes. No evidence of pancreatic ductal dilatation. Spleen:  Within normal limits in size and appearance. Adrenals/Urinary Tract: No masses identified. No evidence of hydronephrosis. Stomach/Bowel: Visualized portion unremarkable. Vascular/Lymphatic: No pathologically enlarged lymph nodes identified. No acute vascular findings. Other:  None. Musculoskeletal:  No suspicious bone lesions identified. IMPRESSION: Cholelithiasis. No evidence of acute cholecystitis, biliary ductal dilatation, or other acute findings. Electronically Signed   By: Marlaine Hind M.D.   On: 02/08/2021 11:23   US Abdomen Limited RUQ (LIVER/GB)  Result Date: 02/08/2021 CLINICAL DATA:  Right upper quadrant pain EXAM: ULTRASOUND ABDOMEN LIMITED RIGHT UPPER QUADRANT COMPARISON:  Same day CT abdomen/pelvis, abdominal ultrasound 01/07/2014 FINDINGS: Gallbladder: There are small layering stones and sludge within the gallbladder lumen. There is mild gallbladder wall thickening measuring up to 5 mm. There is no pericholecystic fluid. Sonographic Percell Miller sign was reported to be negative. Common bile duct: Diameter: 6 mm. There is mild intrahepatic biliary ductal dilatation. Liver: No focal lesion identified. Within normal limits in parenchymal echogenicity. Portal vein is patent on color Doppler imaging with normal direction of blood flow towards the liver. Other: None. IMPRESSION: 1. Small layering stones and sludge in the gallbladder lumen with mild wall thickening. Sonographic Murphy sign was negative. If there is clinical concern for acute cholecystitis, HIDA scan may be obtained. 2. Mild intra and extrahepatic biliary ductal dilatation. Correlate with LFTs. This may be further evaluated with MRCP as indicated. This could be performed on a nonemergent outpatient basis. Electronically Signed   By:  Valetta Mole M.D.   On: 02/08/2021 08:50      Impression / Plan:   Anna Giles is a 60 y.o. y/o female with right upper quadrant pain on presentation, with elevated liver enzymes, with CT and ultrasound showing biliary ductal dilatation, with MRCP not showing any choledocholithiasis or bile duct dilatation, and showing small gallstones, with resolution of abdominal pain at this time  Clinical symptoms with resolution of abdominal pain, and MRCP not showing any choledocholithiasis, consistent with passed stones  Discussed with Dr. Lysle Pearl who is also seeing the patient and is planning on taking patient for cholecystectomy today  Would expect repeat labs to show bilirubin to have decreased and these can be done later today or tomorrow morning  However, patient's transaminases are acutely elevated as well which would not be entirely explained by  past gallstones  She denies any recent alcohol use, but does report history of fatty liver and used to drink daily wine for about 10 years, until 2007.  Denies any previous history of cirrhosis.  Tylenol level is normal.  Acute hepatitis panel is negative.  Ethanol level is negative.  INR is normal, no clinical evidence of cirrhosis  Avoid hepatotoxic drugs Acute elevation in transaminases could be due to ischemic hepatitis.  Avoid hypotension.  IV fluid hydration as necessary  If transaminases do not improve as expected, can obtain further work-up for elevated liver enzymes, including autoimmune work-up if necessary  Patient denies any new medications  Will continue to follow  Thank you for involving me in the care of this patient.      LOS: 0 days   Virgel Manifold, MD  02/08/2021, 3:34 PM

## 2021-02-08 NOTE — ED Notes (Signed)
Report off to karen rn OR nurse

## 2021-02-08 NOTE — ED Notes (Signed)
Dr. Roosevelt Locks notified face to face of critical lactic acid 2.1. Verbal order for 1L NS received.

## 2021-02-08 NOTE — ED Notes (Signed)
Pt alert, iv in place.  Pt waiting on bed assignment.

## 2021-02-08 NOTE — ED Notes (Signed)
Consent signed by pt for surgery.  Iv fluids infusing.  Pt alert

## 2021-02-08 NOTE — Transfer of Care (Signed)
Immediate Anesthesia Transfer of Care Note  Patient: Anna Giles  Procedure(s) Performed: XI ROBOTIC ASSISTED LAPAROSCOPIC CHOLECYSTECTOMY INDOCYANINE GREEN FLUORESCENCE IMAGING (ICG)  Patient Location: PACU  Anesthesia Type:General  Level of Consciousness: awake and alert   Airway & Oxygen Therapy: Patient Spontanous Breathing and Patient connected to face mask oxygen  Post-op Assessment: Report given to RN and Post -op Vital signs reviewed and stable  Post vital signs: Reviewed and stable  Last Vitals:  Vitals Value Taken Time  BP 100/81 02/08/21 1923  Temp    Pulse 161 02/08/21 1924  Resp 23 02/08/21 1924  SpO2 100 % 02/08/21 1924  Vitals shown include unvalidated device data.  Last Pain:  Vitals:   02/08/21 1704  TempSrc: Temporal  PainSc: 0-No pain         Complications: No notable events documented.

## 2021-02-08 NOTE — Consult Note (Signed)
Pharmacy Antibiotic Note  Anna Giles is a 60 y.o. female with PMH of chronic back pain, narcotic dependent, bipolar disorder who was admitted on 02/08/2021 with  intra-abdominal infection .  Pharmacy has been consulted for Unasyn dosing.  Febrile to 100.5, WBC 5.7  Bcx sent  Plan: Unasyn -Will start Unasyn 3g IV q6h  Will continue to monitor and adjust renal function as clinically indicated   Temp (24hrs), Avg:99.6 F (37.6 C), Min:98.6 F (37 C), Max:100.5 F (38.1 C)  Recent Labs  Lab 02/08/21 0453  WBC 5.7  CREATININE 0.99    CrCl cannot be calculated (Unknown ideal weight.).    No Known Allergies  Antimicrobials this admission: Unasyn 10/13 >>   Microbiology results: 10/13 BCx: sent  Thank you for allowing pharmacy to be a part of this patient's care.  Narda Rutherford, PharmD Pharmacy Resident  02/08/2021 2:14 PM

## 2021-02-08 NOTE — ED Notes (Signed)
Pt found to be standing at foot of bed with monitors unplugged and cardiac monitor removed. Pt states she needed to use the bathroom and nobody came to help. Pt denies using call light. Pt educated on call light and instructed to use prior to getting OOB. Pt verbalizes understanding.

## 2021-02-08 NOTE — ED Notes (Signed)
Transported back to room from MRI at this time.

## 2021-02-08 NOTE — ED Notes (Signed)
Pt transported to MRI via stretcher at this time.  °

## 2021-02-08 NOTE — Consult Note (Signed)
Subjective:   CC: biliary colic  HPI:  Anna Giles is a 60 y.o. female who is consulted by [facility] for evaluation of above cc.  Symptoms were first noted 2 days ago. Pain is sharp, intermittent, localized to epigastric area.  Associated with nausea, exacerbated by nothing specific.     Past Medical History:  has a past medical history of Abnormal exam/test finding, Anxiety, Bilateral shoulder pain, Bipolar disorder ([place]), Bipolar II disorder ([place]), Chronic neck pain, Chronic pain of both shoulders, Depression, Encounter to establish care, Fatty liver disease, nonalcoholic, Gastritis, GERD (gastroesophageal reflux disease), Hypertension, Hypothyroidism, Hypothyroidism, adult, Screening for depression, and Transaminitis.  Past Surgical History:  has a past surgical history that includes Neck surgery (10/01/2005); bladder sugery; and Anterior cervical decomp/discectomy fusion (N/A, 04/02/2019).  Family History: family history includes Breast cancer in her paternal grandmother; Depression in her mother; Ulcers in her sister.  Social History:  reports that she quit smoking about 2 years ago. Her smoking use included cigarettes. She has a 40.00 pack-year smoking history. She has never used smokeless tobacco. She reports that she does not currently use alcohol. She reports that she does not use drugs.  Current Medications:  Prior to Admission medications   Medication Sig Start Date End Date Taking? Authorizing Provider  amoxicillin-clavulanate (AUGMENTIN) 875-125 MG tablet Take 1 tablet by mouth 2 (two) times daily. 08/04/20   Steele Sizer, MD  busPIRone (BUSPAR) 10 MG tablet TAKE ONE TABLET BY MOUTH TWICE A DAY 01/25/21   Steele Sizer, MD  Cholecalciferol (VITAMIN D3 PO) Take 1 tablet by mouth daily.    [provider]  escitalopram (LEXAPRO) 10 MG tablet TAKE ONE TABLET BY MOUTH EVERY NIGHT AT BEDTIME 08/20/20   Steele Sizer, MD  levothyroxine (SYNTHROID) 88 MCG tablet TAKE ONE TABLET  BY MOUTH DAILY WITH BREAKFAST 08/10/20   Steele Sizer, MD  morphine (MS CONTIN) 60 MG 12 hr tablet Take 60 mg by mouth 3 (three) times daily. *scheduledHardin Negus, Elta Guadeloupe, MD  morphine (MSIR) 15 MG tablet Take 15 mg by mouth every 6 (six) hours as needed. 01/17/21   [provider]  oxyCODONE (OXY IR/ROXICODONE) 5 MG immediate release tablet Take 5 mg by mouth 4 (four) times daily as needed. 09/19/20   [provider]  oxyCODONE-acetaminophen (PERCOCET/ROXICET) 5-325 MG per tablet Take 1 tablet by mouth 3 (three) times daily. *scheduledHardin Negus, Elta Guadeloupe, MD  pantoprazole (PROTONIX) 40 MG tablet TAKE 1 TABLET BY MOUTH DAILY BEFORE SUPPER. 01/25/21   Ancil Boozer, Drue Stager, MD  polyethylene glycol (MIRALAX / GLYCOLAX) 17 g packet Take 17 g by mouth daily as needed (constipation).     [provider]  QUEtiapine (SEROQUEL) 25 MG tablet Take 1 tablet (25 mg total) by mouth at bedtime. 02/22/20   Steele Sizer, MD  sulfamethoxazole-trimethoprim (BACTRIM DS) 800-160 MG tablet Take 1 tablet by mouth 2 (two) times daily. 08/04/20   Steele Sizer, MD  tiZANidine (ZANAFLEX) 4 MG tablet Take 6 mg by mouth 3 (three) times daily. *scheduledNicholaus Bloom, MD    Allergies:  Allergies as of 02/08/2021   (No Known Allergies)    ROS:  General: Denies weight loss, weight gain, fatigue, fevers, chills, and night sweats. Eyes: Denies blurry vision, double vision, eye pain, itchy eyes, and tearing. Ears: Denies hearing loss, earache, and ringing in ears. Nose: Denies sinus pain, congestion, infections, runny nose, and nosebleeds. Mouth/throat: Denies hoarseness, sore throat, bleeding gums, and difficulty  swallowing. Heart: Denies chest pain, palpitations, racing heart, irregular heartbeat, leg pain or swelling, and decreased activity tolerance. Respiratory: Denies breathing difficulty, shortness of breath, wheezing, cough, and sputum. GI: Denies change in appetite, heartburn,  vomiting, constipation, diarrhea, and blood in stool. GU: Denies difficulty urinating, pain with urinating, urgency, frequency, blood in urine. Musculoskeletal: Denies joint stiffness, pain, swelling, muscle weakness. Skin: Denies rash, itching, mass, tumors, sores, and boils Neurologic: Denies headache, fainting, dizziness, seizures, numbness, and tingling. Psychiatric: Denies depression, anxiety, difficulty sleeping, and memory loss. Endocrine: Denies heat or cold intolerance, and increased thirst or urination. Blood/lymph: Denies easy bruising, and swollen glands     Objective:     BP 96/66 (BP Location: Left Arm)   Pulse 79   Temp 99.8 F (37.7 C) (Oral)   Resp 20   Ht 5\' 4"  (1.626 m)   Wt 61.2 kg   SpO2 96%   BMI 23.17 kg/m    Constitutional :  alert, cooperative, appears stated age, and no distress  Lymphatics/Throat:  no asymmetry, masses, or scars  Respiratory:  clear to auscultation bilaterally  Cardiovascular:  regular rate and rhythm  Gastrointestinal: Soft, no guarding, some TTP in epigastric region .   Musculoskeletal: Steady movement  Skin: Cool and moist, no surgical scars  Psychiatric: Normal affect, non-agitated, not confused       LABS:  CMP Latest Ref Rng & Units 02/08/2021 03/31/2019 12/28/2018  Glucose 70 - 99 mg/dL 166(H) 93 87  BUN 6 - 20 mg/dL 25(H) 21(H) 20  Creatinine 0.44 - 1.00 mg/dL 0.99 1.02(H) 1.03  Sodium 135 - 145 mmol/L 136 139 140  Potassium 3.5 - 5.1 mmol/L 3.5 3.8 4.2  Chloride 98 - 111 mmol/L 100 103 103  CO2 22 - 32 mmol/L 24 28 34(H)  Calcium 8.9 - 10.3 mg/dL 9.6 9.5 9.9  Total Protein 6.5 - 8.1 g/dL 7.9 - 7.5  Total Bilirubin 0.3 - 1.2 mg/dL 3.4(H) - 0.4  Alkaline Phos 38 - 126 U/L 104 - -  AST 15 - 41 U/L 982(H) - 20  ALT 0 - 44 U/L 555(H) - 21   CBC Latest Ref Rng & Units 02/08/2021 03/31/2019 12/28/2018  WBC 4.0 - 10.5 K/uL 5.7 4.9 5.8  Hemoglobin 12.0 - 15.0 g/dL 13.2 13.3 13.5  Hematocrit 36.0 - 46.0 % 37.7 40.8 41.0   Platelets 150 - 400 K/uL 158 196 203     RADS: CLINICAL DATA:  Right upper quadrant pain   EXAM: ULTRASOUND ABDOMEN LIMITED RIGHT UPPER QUADRANT   COMPARISON:  Same day CT abdomen/pelvis, abdominal ultrasound 01/07/2014   FINDINGS: Gallbladder:   There are small layering stones and sludge within the gallbladder lumen. There is mild gallbladder wall thickening measuring up to 5 mm. There is no pericholecystic fluid. Sonographic Percell Miller sign was reported to be negative.   Common bile duct:   Diameter: 6 mm. There is mild intrahepatic biliary ductal dilatation.   Liver:   No focal lesion identified. Within normal limits in parenchymal echogenicity. Portal vein is patent on color Doppler imaging with normal direction of blood flow towards the liver.   Other: None.   IMPRESSION: 1. Small layering stones and sludge in the gallbladder lumen with mild wall thickening. Sonographic Murphy sign was negative. If there is clinical concern for acute cholecystitis, HIDA scan may be obtained. 2. Mild intra and extrahepatic biliary ductal dilatation. Correlate with LFTs. This may be further evaluated with MRCP as indicated. This could be performed on a nonemergent  outpatient basis.     Electronically Signed   By: Valetta Mole M.D.   On: 02/08/2021 08:50   CLINICAL DATA:  Severe epigastric and abdominal pain. Nausea and vomiting. Alcohol misuse. Cholelithiasis   EXAM: MRI ABDOMEN WITHOUT AND WITH CONTRAST (INCLUDING MRCP)   TECHNIQUE: Multiplanar multisequence MR imaging of the abdomen was performed both before and after the administration of intravenous contrast. Heavily T2-weighted images of the biliary and pancreatic ducts were obtained, and three-dimensional MRCP images were rendered by post processing.   CONTRAST:  21mL GADAVIST GADOBUTROL 1 MMOL/ML IV SOLN   COMPARISON:  CT on 02/08/2021, and chest CT on 01/05/2019   FINDINGS: Lower chest: Sub-cm T2 hyperintense  pulmonary nodule in the posterior right lung base is stable compared to previous chest CT in 2020, consistent with benign etiology.   Hepatobiliary: No hepatic masses identified. No evidence of steatosis. A few tiny less than 1 cm gallstones are noted. No evidence of gallbladder wall thickening or pericholecystic inflammatory changes. No evidence of biliary ductal dilatation with common bile duct measuring 5 mm. No evidence of choledocholithiasis.   Pancreas: No mass or inflammatory changes. No evidence of pancreatic ductal dilatation.   Spleen:  Within normal limits in size and appearance.   Adrenals/Urinary Tract: No masses identified. No evidence of hydronephrosis.   Stomach/Bowel: Visualized portion unremarkable.   Vascular/Lymphatic: No pathologically enlarged lymph nodes identified. No acute vascular findings.   Other:  None.   Musculoskeletal:  No suspicious bone lesions identified.   IMPRESSION: Cholelithiasis. No evidence of acute cholecystitis, biliary ductal dilatation, or other acute findings.     Electronically Signed   By: Marlaine Hind M.D.   On: 02/08/2021 11:23   Assessment:      Epigastric pain, elevated LFTs, consistent with likely biliary colic and resolved choledocolithiasis.  Pt still having persistent epigastric pain, so will proceed with robotic assisted lap chole.  Plan:      Discussed the risk of surgery including post-op infxn, seroma, biloma, chronic pain, poor-delayed wound healing, retained gallstone, conversion to open procedure, post-op SBO or ileus, and need for additional procedures to address said risks.  The risks of general anesthetic including MI, CVA, sudden death or even reaction to anesthetic medications also discussed. Alternatives include continued observation.  Benefits include possible symptom relief, prevention of complications including acute cholecystitis, pancreatitis.  Typical post operative recovery of 3-5 days rest,  continued pain in area and incision sites, possible loose stools up to 4-6 weeks, also discussed.  The patient understands the risks, any and all questions were answered to the patient's satisfaction.

## 2021-02-08 NOTE — Op Note (Signed)
Preoperative diagnosis:  acute and cholecystitis  Postoperative diagnosis: same as above  Procedure: Robotic assisted Laparoscopic Cholecystectomy.   Anesthesia: GETA   Surgeon: Benjamine Sprague  Specimen: Gallbladder  Complications: None  EBL: 10mL  Wound Classification: Clean Contaminated  Indications: see HPI  Findings: Critical view of safety noted Cystic duct and artery identified, ligated and divided, clips remained intact at end of procedure Adequate hemostasis  Description of procedure:  The patient was placed on the operating table in the supine position. SCDs placed, pre-op abx administered.  General anesthesia was induced and OG tube placed by anesthesia. A time-out was completed verifying correct patient, procedure, site, positioning, and implant(s) and/or special equipment prior to beginning this procedure. The abdomen was prepped and draped in the usual sterile fashion.    Veress needle was placed at the Palmer's point and insufflation was started after confirming a positive saline drop test and no immediate increase in abdominal pressure.  After reaching 15 mm, the Veress needle was removed and a 8 mm port was placed via optiview technique under umbilicus measured 67EL from gallbladder.  The abdomen was inspected and no abnormalities or injuries were found.  Under direct vision, ports were placed in the following locations: One 12 mm patient left of the umbilicus, 8cm from the optiviewed port, one 8 mm port placed to the patient right of the umbilical port 8 cm apart.  1 additional 8 mm port placed lateral to the 70mm port.  Once ports were placed, The table was placed in the reverse Trendelenburg position with the right side up. The Xi platform was brought into the operative field and docked to the ports successfully.  An endoscope was placed through the umbilical port, fenestrated grasper through the adjacent patient right port, prograsp to the far patient left port, and then  a hook cautery in the left port.  The dome of the gallbladder was grasped with prograsp, passed and retracted over the dome of the liver. Adhesions between the gallbladder and omentum, duodenum and transverse colon were lysed via hook cautery. The infundibulum was grasped with the fenestrated grasper and retracted toward the right lower quadrant. This maneuver exposed Calot's triangle. The peritoneum overlying the gallbladder infundibulum was then dissected  and the cystic duct and cystic artery identified.  Critical view of safety with the liver bed clearly visible behind the duct and artery with no additional structures noted.  The cystic duct and cystic artery clipped and divided close to the gallbladder.     The gallbladder was then dissected from its peritoneal and liver bed attachments by electrocautery. Hemostasis was checked prior to removing the hook cautery and the Endo Catch bag was then placed through the 12 mm port and the gallbladder was removed.  The gallbladder was passed off the table as a specimen. There was no evidence of bleeding from the gallbladder fossa or cystic artery or leakage of the bile from the cystic duct stump. The 12 mm port site closed with Efx shield.  Abdomen desufflated and secondary trocars were removed under direct vision. No bleeding was noted. All skin incisions then closed with subcuticular sutures of 4-0 monocryl and dressed with topical skin adhesive. Veress needle site had burn injury from cautery used to control bleeding, so hydrogel dressing applied. The orogastric tube was removed and patient extubated.  The patient tolerated the procedure well and was taken to the postanesthesia care unit in stable condition.  All sponge and instrument count correct at end of procedure.

## 2021-02-08 NOTE — H&P (Signed)
History and Physical    Anna Giles FTD:[id number] DOB: Aug 12, 1960 DOA: 02/08/2021  PCP: Steele Sizer, MD (Confirm with patient/family/NH records and if not entered, this has to be entered at [facility] point of entry) Patient coming from: Home  I have personally briefly reviewed patient's old medical records in [place]  Chief Complaint: Belly hurts, feeling nausea  HPI: Anna Giles is a 60 y.o. female with medical history significant of chronic back pain, narcotic dependent, bipolar disorder, comes in for worsening of right upper quadrant abdominal pain.  Patient has had several episodes of RUQ abdominal pain over the last few months, usually after eating, will have some resolved by themselves.  Yesterday evening, after eating a beef meal, patient started to have similar RUQ squeezing-like abdominal pain, nonradiating, associated with nausea and vomited 2 times of stomach content no bile or blood.  Overnight, her abdominal pain gradual getting worse, feels episode of chills but no fever no more vomiting but still feel nauseous.  ED Course: Spiked low grade fever of 100.5 F, BP stable.  CT abdomen pelvis showed gallstones, mild intrahepatic and extrahepatic biliary dilatation and possible mild gallbladder wall inflammation, no CBD dilatation.  Ultrasound showed small layering stones and sludge gallbladder lumen with wall thickening, again mild intra and extrahepatic biliary duct dilatation.  MRCP negative for CBD obstruction.  AST 980, ALT 550.  WBC within normal.  Review of Systems: As per HPI otherwise 14 point review of systems negative.    Past Medical History:  Diagnosis Date   Abnormal exam/test finding    Anxiety    Bilateral shoulder pain    Bipolar disorder (HCC)    Bipolar II disorder (HCC)    Chronic neck pain    Chronic pain of both shoulders    Depression    Encounter to establish care    Fatty liver disease, nonalcoholic    Gastritis    GERD  (gastroesophageal reflux disease)    Hypertension    Hypothyroidism    Hypothyroidism, adult    Screening for depression    Transaminitis     Past Surgical History:  Procedure Laterality Date   ANTERIOR CERVICAL DECOMP/DISCECTOMY FUSION N/A 04/02/2019   Procedure: ANTERIOR CERVICAL DECOMPRESSION FUSION CERVICAL SIX-SEVEN, REMOVAL OF PLATE.;  Surgeon: Eustace Moore, MD;  Location: [place];  Service: Neurosurgery;  Laterality: N/A;  anterior   bladder sugery     Age 58 or 6   NECK SURGERY  10/01/2005     reports that she quit smoking about 2 years ago. Her smoking use included cigarettes. She has a 40.00 pack-year smoking history. She has never used smokeless tobacco. She reports that she does not currently use alcohol. She reports that she does not use drugs.  No Known Allergies  Family History  Problem Relation Age of Onset   Depression Mother    Ulcers Sister    Breast cancer Paternal Grandmother      Prior to Admission medications   Medication Sig Start Date End Date Taking? Authorizing Provider  amoxicillin-clavulanate (AUGMENTIN) 875-125 MG tablet Take 1 tablet by mouth 2 (two) times daily. 08/04/20   Steele Sizer, MD  busPIRone (BUSPAR) 10 MG tablet TAKE ONE TABLET BY MOUTH TWICE A DAY 01/25/21   Steele Sizer, MD  Cholecalciferol (VITAMIN D3 PO) Take 1 tablet by mouth daily.    [provider]  escitalopram (LEXAPRO) 10 MG tablet TAKE ONE TABLET BY MOUTH EVERY NIGHT AT BEDTIME 08/20/20   Sowles,  Drue Stager, MD  levothyroxine (SYNTHROID) 88 MCG tablet TAKE ONE TABLET BY MOUTH DAILY WITH BREAKFAST 08/10/20   Steele Sizer, MD  morphine (MS CONTIN) 60 MG 12 hr tablet Take 60 mg by mouth 3 (three) times daily. *scheduledHardin Negus, Elta Guadeloupe, MD  morphine (MSIR) 15 MG tablet Take 15 mg by mouth every 6 (six) hours as needed. 01/17/21   [provider]  oxyCODONE (OXY IR/ROXICODONE) 5 MG immediate release tablet Take 5 mg by mouth 4 (four) times daily as needed.  09/19/20   [provider]  oxyCODONE-acetaminophen (PERCOCET/ROXICET) 5-325 MG per tablet Take 1 tablet by mouth 3 (three) times daily. *scheduledHardin Negus, Elta Guadeloupe, MD  pantoprazole (PROTONIX) 40 MG tablet TAKE 1 TABLET BY MOUTH DAILY BEFORE SUPPER. 01/25/21   Ancil Boozer, Drue Stager, MD  polyethylene glycol (MIRALAX / GLYCOLAX) 17 g packet Take 17 g by mouth daily as needed (constipation).     [provider]  QUEtiapine (SEROQUEL) 25 MG tablet Take 1 tablet (25 mg total) by mouth at bedtime. 02/22/20   Steele Sizer, MD  sulfamethoxazole-trimethoprim (BACTRIM DS) 800-160 MG tablet Take 1 tablet by mouth 2 (two) times daily. 08/04/20   Steele Sizer, MD  tiZANidine (ZANAFLEX) 4 MG tablet Take 6 mg by mouth 3 (three) times daily. *scheduledNicholaus Bloom, MD    Physical Exam: Vitals:   02/08/21 0930 02/08/21 1000 02/08/21 1156 02/08/21 1248  BP: 109/67 106/68 (!) 98/56 (!) 94/58  Pulse: 90 90 88 82  Resp: 16 14 16  (!) 27  Temp:    (!) 100.5 F (38.1 C)  TempSrc:    Oral  SpO2: 94% 95% 95% 95%    Constitutional: NAD, calm, comfortable Vitals:   02/08/21 0930 02/08/21 1000 02/08/21 1156 02/08/21 1248  BP: 109/67 106/68 (!) 98/56 (!) 94/58  Pulse: 90 90 88 82  Resp: 16 14 16  (!) 27  Temp:    (!) 100.5 F (38.1 C)  TempSrc:    Oral  SpO2: 94% 95% 95% 95%   Eyes: PERRL, lids and conjunctivae normal ENMT: Mucous membranes are dry. Posterior pharynx clear of any exudate or lesions.Normal dentition.  Neck: normal, supple, no masses, no thyromegaly Respiratory: clear to auscultation bilaterally, no wheezing, no crackles. Normal respiratory effort. No accessory muscle use.  Cardiovascular: Regular rate and rhythm, no murmurs / rubs / gallops. No extremity edema. 2+ pedal pulses. No carotid bruits.  Abdomen: mild RUQ tenderness, no rebound, no guarding, no masses palpated. No hepatosplenomegaly. Bowel sounds positive.  Musculoskeletal: no clubbing / cyanosis. No joint  deformity upper and lower extremities. Good ROM, no contractures. Normal muscle tone.  Skin: no rashes, lesions, ulcers. No induration Neurologic: CN 2-12 grossly intact. Sensation intact, DTR normal. Strength 5/5 in all 4.  Psychiatric: Normal judgment and insight. Alert and oriented x 3. Normal mood.     Labs on Admission: I have personally reviewed following labs and imaging studies  CBC: Recent Labs  Lab 02/08/21 0453  WBC 5.7  NEUTROABS 5.3  HGB 13.2  HCT 37.7  MCV 87.1  PLT 710   Basic Metabolic Panel: Recent Labs  Lab 02/08/21 0453  NA 136  K 3.5  CL 100  CO2 24  GLUCOSE 166*  BUN 25*  CREATININE 0.99  CALCIUM 9.6   GFR: CrCl cannot be calculated (Unknown ideal weight.). Liver Function Tests: Recent Labs  Lab 02/08/21 0453  AST 982*  ALT 555*  ALKPHOS 104  BILITOT 3.4*  PROT 7.9  ALBUMIN 4.1   Recent Labs  Lab 02/08/21 0453  LIPASE 69*   No results for input(s): AMMONIA in the last 168 hours. Coagulation Profile: Recent Labs  Lab 02/08/21 0531  INR 1.0   Cardiac Enzymes: No results for input(s): CKTOTAL, CKMB, CKMBINDEX, TROPONINI in the last 168 hours. BNP (last 3 results) No results for input(s): PROBNP in the last 8760 hours. HbA1C: No results for input(s): HGBA1C in the last 72 hours. CBG: No results for input(s): GLUCAP in the last 168 hours. Lipid Profile: No results for input(s): CHOL, HDL, LDLCALC, TRIG, CHOLHDL, LDLDIRECT in the last 72 hours. Thyroid Function Tests: No results for input(s): TSH, T4TOTAL, FREET4, T3FREE, THYROIDAB in the last 72 hours. Anemia Panel: No results for input(s): VITAMINB12, FOLATE, FERRITIN, TIBC, IRON, RETICCTPCT in the last 72 hours. Urine analysis:    Component Value Date/Time   COLORURINE Yellow 01/16/2013 0017   APPEARANCEUR Hazy 01/16/2013 0017   LABSPEC 1.021 01/16/2013 0017   PHURINE 5.0 01/16/2013 0017   GLUCOSEU Negative 01/16/2013 0017   HGBUR 2+ 01/16/2013 0017   BILIRUBINUR neg  09/03/2016 1004   BILIRUBINUR Negative 01/16/2013 0017   KETONESUR 1+ 01/16/2013 0017   PROTEINUR Trace 09/03/2016 1004   PROTEINUR 30 mg/dL 01/16/2013 0017   UROBILINOGEN 0.2 09/03/2016 1004   NITRITE neg 09/03/2016 1004   NITRITE Negative 01/16/2013 0017   LEUKOCYTESUR Negative 09/03/2016 1004   LEUKOCYTESUR Negative 01/16/2013 0017    Radiological Exams on Admission: CT ABDOMEN PELVIS W CONTRAST  Result Date: 02/08/2021 CLINICAL DATA:  Abdominal pain, nausea and vomiting since last evening. EXAM: CT ABDOMEN AND PELVIS WITH CONTRAST TECHNIQUE: Multidetector CT imaging of the abdomen and pelvis was performed using the standard protocol following bolus administration of intravenous contrast. CONTRAST:  53mL OMNIPAQUE IOHEXOL 350 MG/ML SOLN COMPARISON:  CT scan 02/21/2011 FINDINGS: Lower chest: The lung bases are clear of acute process. No pleural effusion or pulmonary lesions. The heart is normal in size. No pericardial effusion. The distal esophagus and aorta are unremarkable. Hepatobiliary: No hepatic lesions are identified. There is mild intrahepatic biliary dilatation centrally along with mild common bile duct dilatation measuring 9 mm in the porta hepatis and 8.5 mm in the head of the pancreas. No obvious gallstones. Slight enhancement of the gallbladder wall and possible mild gallbladder wall thickening. Pancreas: No mass, inflammation or ductal dilatation. Spleen: Normal size. No focal lesions. Adrenals/Urinary Tract: Adrenal glands and kidneys are unremarkable. The bladder is unremarkable. Stomach/Bowel: The stomach, duodenum, small bowel and colon are unremarkable. No acute inflammatory process, mass lesion or obstructive findings. The terminal ileum and appendix are normal. There is scattered colonic diverticulosis but no findings for acute diverticulitis. Vascular/Lymphatic: The aorta is normal in caliber. No dissection. The branch vessels are patent. The major venous structures are  patent. No mesenteric or retroperitoneal mass or adenopathy. Small scattered lymph nodes are noted. Reproductive: The uterus and ovaries are unremarkable. Other: No pelvic mass or adenopathy. No free pelvic fluid collections. No inguinal mass or adenopathy. No abdominal wall hernia or subcutaneous lesions. Musculoskeletal: No significant bony findings. IMPRESSION: 1. No abdominal mass lesions or adenopathy. 2. Mild intrahepatic and extrahepatic biliary dilatation and possible mild gallbladder inflammation. Recommend correlation with right upper quadrant ultrasound examination. 3. Scattered colonic diverticulosis without findings for acute diverticulitis. Electronically Signed   By: Marijo Sanes M.D.   On: 02/08/2021 06:52   MR 3D Recon At Scanner  Result Date: 02/08/2021 CLINICAL DATA:  Severe epigastric and  abdominal pain. Nausea and vomiting. Alcohol misuse. Cholelithiasis EXAM: MRI ABDOMEN WITHOUT AND WITH CONTRAST (INCLUDING MRCP) TECHNIQUE: Multiplanar multisequence MR imaging of the abdomen was performed both before and after the administration of intravenous contrast. Heavily T2-weighted images of the biliary and pancreatic ducts were obtained, and three-dimensional MRCP images were rendered by post processing. CONTRAST:  85mL GADAVIST GADOBUTROL 1 MMOL/ML IV SOLN COMPARISON:  CT on 02/08/2021, and chest CT on 01/05/2019 FINDINGS: Lower chest: Sub-cm T2 hyperintense pulmonary nodule in the posterior right lung base is stable compared to previous chest CT in 2020, consistent with benign etiology. Hepatobiliary: No hepatic masses identified. No evidence of steatosis. A few tiny less than 1 cm gallstones are noted. No evidence of gallbladder wall thickening or pericholecystic inflammatory changes. No evidence of biliary ductal dilatation with common bile duct measuring 5 mm. No evidence of choledocholithiasis. Pancreas: No mass or inflammatory changes. No evidence of pancreatic ductal dilatation. Spleen:   Within normal limits in size and appearance. Adrenals/Urinary Tract: No masses identified. No evidence of hydronephrosis. Stomach/Bowel: Visualized portion unremarkable. Vascular/Lymphatic: No pathologically enlarged lymph nodes identified. No acute vascular findings. Other:  None. Musculoskeletal:  No suspicious bone lesions identified. IMPRESSION: Cholelithiasis. No evidence of acute cholecystitis, biliary ductal dilatation, or other acute findings. Electronically Signed   By: Marlaine Hind M.D.   On: 02/08/2021 11:23   MR ABDOMEN MRCP W WO CONTAST  Result Date: 02/08/2021 CLINICAL DATA:  Severe epigastric and abdominal pain. Nausea and vomiting. Alcohol misuse. Cholelithiasis EXAM: MRI ABDOMEN WITHOUT AND WITH CONTRAST (INCLUDING MRCP) TECHNIQUE: Multiplanar multisequence MR imaging of the abdomen was performed both before and after the administration of intravenous contrast. Heavily T2-weighted images of the biliary and pancreatic ducts were obtained, and three-dimensional MRCP images were rendered by post processing. CONTRAST:  58mL GADAVIST GADOBUTROL 1 MMOL/ML IV SOLN COMPARISON:  CT on 02/08/2021, and chest CT on 01/05/2019 FINDINGS: Lower chest: Sub-cm T2 hyperintense pulmonary nodule in the posterior right lung base is stable compared to previous chest CT in 2020, consistent with benign etiology. Hepatobiliary: No hepatic masses identified. No evidence of steatosis. A few tiny less than 1 cm gallstones are noted. No evidence of gallbladder wall thickening or pericholecystic inflammatory changes. No evidence of biliary ductal dilatation with common bile duct measuring 5 mm. No evidence of choledocholithiasis. Pancreas: No mass or inflammatory changes. No evidence of pancreatic ductal dilatation. Spleen:  Within normal limits in size and appearance. Adrenals/Urinary Tract: No masses identified. No evidence of hydronephrosis. Stomach/Bowel: Visualized portion unremarkable. Vascular/Lymphatic: No  pathologically enlarged lymph nodes identified. No acute vascular findings. Other:  None. Musculoskeletal:  No suspicious bone lesions identified. IMPRESSION: Cholelithiasis. No evidence of acute cholecystitis, biliary ductal dilatation, or other acute findings. Electronically Signed   By: Marlaine Hind M.D.   On: 02/08/2021 11:23   US Abdomen Limited RUQ (LIVER/GB)  Result Date: 02/08/2021 CLINICAL DATA:  Right upper quadrant pain EXAM: ULTRASOUND ABDOMEN LIMITED RIGHT UPPER QUADRANT COMPARISON:  Same day CT abdomen/pelvis, abdominal ultrasound 01/07/2014 FINDINGS: Gallbladder: There are small layering stones and sludge within the gallbladder lumen. There is mild gallbladder wall thickening measuring up to 5 mm. There is no pericholecystic fluid. Sonographic Percell Miller sign was reported to be negative. Common bile duct: Diameter: 6 mm. There is mild intrahepatic biliary ductal dilatation. Liver: No focal lesion identified. Within normal limits in parenchymal echogenicity. Portal vein is patent on color Doppler imaging with normal direction of blood flow towards the liver. Other: None. IMPRESSION: 1. Small layering  stones and sludge in the gallbladder lumen with mild wall thickening. Sonographic Murphy sign was negative. If there is clinical concern for acute cholecystitis, HIDA scan may be obtained. 2. Mild intra and extrahepatic biliary ductal dilatation. Correlate with LFTs. This may be further evaluated with MRCP as indicated. This could be performed on a nonemergent outpatient basis. Electronically Signed   By: Valetta Mole M.D.   On: 02/08/2021 08:50    EKG: Independently reviewed. Sinus, no acute ST-T changes.  Assessment/Plan Active Problems:   Biliary colic   Colic, biliary  (please populate well all problems here in Problem List. (For example, if patient is on BP meds at home and you resume or decide to hold them, it is a problem that needs to be her. Same for CAD, COPD, HLD and so on)  Acute  biliary colic -Suspect early cholecystitis given there is a new low grade fever, check lactic acid and send blood culture x2. Start Unasyn -Image study implying a passing CBD stone, no pancreatitis. -IVF and clear liquid for now and NPO after midnight.  Acute transaminitis -suspect passing CBD stone -Normal hepatic synthetic functions of INR and Alb level -Recheck LFTs in AM.  Elevated glucose -No Hx of DM -Check A1C  Chronic pain syndrome -Stable, on home regimen of narcotics  Bipolar disorder -Was delirius early this morning on arrival to ED and received Halodol x1, mentation back to baseline. Episode likely isolated  and related to the RUQ pain. -No acute issue.  DVT prophylaxis: Lovenox  Code Status: Full Code Family Communication: None at bedside Disposition Plan: Expect more than 2 midnight hospital stay Consults called: General surgery Admission status: MedSurg.   Lequita Halt MD [facility] Pager [phone]  02/08/2021, 12:55 PM

## 2021-02-08 NOTE — ED Provider Notes (Signed)
9:16 AM Assumed care for off going team.   Blood pressure 115/69, pulse 92, temperature 98.6 F (37 C), temperature source Oral, resp. rate 20, SpO2 95 %.  See their HPI for full report but in brief pending ultrasound   Ultrasound consistent with stones and recommended correlating with MRCP for elevated LFTs.  We will get MRCP to further evaluate.  Discussed with GI Dr. Bonna Gains and Dr. Allen Norris is not here so if positive would need tansfer  9:16 AM updated patient on the results.  Patient's pain is well controlled at this time  11:52 AM MRCP is negative for cholecystitis or for choledocholithiasis therefore will admit here for further trend of LFTs.  Patient was admitted to the hospitalist given negative MRCP          Vanessa [place], MD 02/08/21 1315

## 2021-02-08 NOTE — Anesthesia Preprocedure Evaluation (Signed)
Anesthesia Evaluation  Patient identified by MRN, date of birth, ID band Patient awake    Reviewed: Allergy & Precautions, H&P , NPO status , Patient's Chart, lab work & pertinent test results, reviewed documented beta blocker date and time   History of Anesthesia Complications Negative for: history of anesthetic complications  Airway Mallampati: III  TM Distance: <3 FB Neck ROM: full  Mouth opening: Limited Mouth Opening  Dental  (+) Dental Advidsory Given, Upper Dentures, Lower Dentures   Pulmonary neg pulmonary ROS, former smoker,    Pulmonary exam normal breath sounds clear to auscultation       Cardiovascular Exercise Tolerance: Good negative cardio ROS Normal cardiovascular exam Rhythm:regular Rate:Normal     Neuro/Psych PSYCHIATRIC DISORDERS Anxiety Depression Bipolar Disorder negative neurological ROS     GI/Hepatic GERD  ,NAFLD   Endo/Other  neg diabetesHypothyroidism   Renal/GU negative Renal ROS  negative genitourinary   Musculoskeletal   Abdominal   Peds  Hematology negative hematology ROS (+)   Anesthesia Other Findings Past Medical History: No date: Abnormal exam/test finding No date: Anxiety No date: Bilateral shoulder pain No date: Bipolar disorder (HCC) No date: Bipolar II disorder (HCC) No date: Chronic neck pain No date: Chronic pain of both shoulders No date: Depression No date: Encounter to establish care No date: Fatty liver disease, nonalcoholic No date: Gastritis No date: GERD (gastroesophageal reflux disease) No date: Hypertension No date: Hypothyroidism No date: Hypothyroidism, adult No date: Screening for depression No date: Transaminitis   Reproductive/Obstetrics negative OB ROS                             Anesthesia Physical Anesthesia Plan  ASA: 2  Anesthesia Plan: General   Post-op Pain Management:    Induction: Intravenous  PONV Risk  Score and Plan: 3 and Ondansetron, Dexamethasone, Midazolam, Promethazine and Treatment may vary due to age or medical condition  Airway Management Planned: Oral ETT  Additional Equipment:   Intra-op Plan:   Post-operative Plan: Extubation in OR  Informed Consent: I have reviewed the patients History and Physical, chart, labs and discussed the procedure including the risks, benefits and alternatives for the proposed anesthesia with the patient or authorized representative who has indicated his/her understanding and acceptance.     Dental Advisory Given  Plan Discussed with: Anesthesiologist, CRNA and Surgeon  Anesthesia Plan Comments:         Anesthesia Quick Evaluation

## 2021-02-08 NOTE — Progress Notes (Signed)
Lactic acid 2.1, BP holds, mentation baseline.   One liter NS bolus ordered. Upgrade to Tele.

## 2021-02-08 NOTE — Anesthesia Procedure Notes (Signed)
Procedure Name: Intubation Date/Time: 02/08/2021 5:57 PM Performed by: Jonna Clark, CRNA Pre-anesthesia Checklist: Patient identified, Patient being monitored, Timeout performed, Emergency Drugs available and Suction available Patient Re-evaluated:Patient Re-evaluated prior to induction Oxygen Delivery Method: Circle system utilized Preoxygenation: Pre-oxygenation with 100% oxygen Induction Type: IV induction Ventilation: Mask ventilation without difficulty Laryngoscope Size: 3 and McGraph Grade View: Grade I Tube type: Oral Tube size: 7.0 mm Number of attempts: 1 Airway Equipment and Method: Stylet Placement Confirmation: ETT inserted through vocal cords under direct vision, positive ETCO2 and breath sounds checked- equal and bilateral Secured at: 21 cm Tube secured with: Tape Dental Injury: Teeth and Oropharynx as per pre-operative assessment

## 2021-02-08 NOTE — ED Provider Notes (Signed)
[facility] Emergency Department Provider Note  ____________________________________________   Event Date/Time   First MD Initiated Contact with Patient 02/08/21 [phone]     (approximate)  I have reviewed the triage vital signs and the nursing notes.   HISTORY  Chief Complaint Abdominal Pain, Nausea, and Emesis    HPI Anna Giles is a 60 y.o. female  with h/o alcoholism, bipolar d/o, hypothyroidism, hypertension, here with abd pain, nausea, vomiting. Pt reports acute onset of severe, epigastric abd pain with associated nausea, vomiting. Sx have been ongoing and constant. She has been unable to keep anything down. Denies any preceding suspicious food intake or sick contacts. She reports she has a h/o gastritis, is on PPI and has been taking it but did hurt like this when her gastritis "flares up." No known fevers. No recent med changes. No melena.        Past Medical History:  Diagnosis Date   Abnormal exam/test finding    Anxiety    Bilateral shoulder pain    Bipolar disorder (HCC)    Bipolar II disorder (HCC)    Chronic neck pain    Chronic pain of both shoulders    Depression    Encounter to establish care    Fatty liver disease, nonalcoholic    Gastritis    GERD (gastroesophageal reflux disease)    Hypertension    Hypothyroidism    Hypothyroidism, adult    Screening for depression    Transaminitis     Patient Active Problem List   Diagnosis Date Noted   S/P cervical spinal fusion 04/02/2019   History of alcoholism ([place]) 12/28/2018   Dyslipidemia 07/24/2017   Skin lesion of back 01/31/2015   Alveolar aeration decreased 08/30/2014   Anxiety 08/30/2014   Bipolar 2 disorder ([place]) 08/30/2014   Chronic cervical pain 08/30/2014   Pain in shoulder 08/30/2014   Blood pressure elevated 08/30/2014   Fatty liver disease, nonalcoholic 31/54/0086   Gastritis 08/30/2014   Adult hypothyroidism 08/30/2014   Elevation of level of transaminase or  lactic acid dehydrogenase (LDH) 08/30/2014    Past Surgical History:  Procedure Laterality Date   ANTERIOR CERVICAL DECOMP/DISCECTOMY FUSION N/A 04/02/2019   Procedure: ANTERIOR CERVICAL DECOMPRESSION FUSION CERVICAL SIX-SEVEN, REMOVAL OF PLATE.;  Surgeon: Eustace Moore, MD;  Location: [place];  Service: Neurosurgery;  Laterality: N/A;  anterior   bladder sugery     Age 59 or 6   NECK SURGERY  10/01/2005    Prior to Admission medications   Medication Sig Start Date End Date Taking? Authorizing Provider  amoxicillin-clavulanate (AUGMENTIN) 875-125 MG tablet Take 1 tablet by mouth 2 (two) times daily. 08/04/20   Steele Sizer, MD  busPIRone (BUSPAR) 10 MG tablet TAKE ONE TABLET BY MOUTH TWICE A DAY 01/25/21   Steele Sizer, MD  Cholecalciferol (VITAMIN D3 PO) Take 1 tablet by mouth daily.    [provider]  escitalopram (LEXAPRO) 10 MG tablet TAKE ONE TABLET BY MOUTH EVERY NIGHT AT BEDTIME 08/20/20   Steele Sizer, MD  levothyroxine (SYNTHROID) 88 MCG tablet TAKE ONE TABLET BY MOUTH DAILY WITH BREAKFAST 08/10/20   Steele Sizer, MD  morphine (MS CONTIN) 60 MG 12 hr tablet Take 60 mg by mouth 3 (three) times daily. *scheduledHardin Negus, Elta Guadeloupe, MD  morphine (MSIR) 15 MG tablet Take 15 mg by mouth every 6 (six) hours as needed. 01/17/21   [provider]  oxyCODONE (OXY IR/ROXICODONE) 5 MG immediate release tablet Take  5 mg by mouth 4 (four) times daily as needed. 09/19/20   [provider]  oxyCODONE-acetaminophen (PERCOCET/ROXICET) 5-325 MG per tablet Take 1 tablet by mouth 3 (three) times daily. *scheduledHardin Negus, Elta Guadeloupe, MD  pantoprazole (PROTONIX) 40 MG tablet TAKE 1 TABLET BY MOUTH DAILY BEFORE SUPPER. 01/25/21   Ancil Boozer, Drue Stager, MD  polyethylene glycol (MIRALAX / GLYCOLAX) 17 g packet Take 17 g by mouth daily as needed (constipation).     [provider]  QUEtiapine (SEROQUEL) 25 MG tablet Take 1 tablet (25 mg total) by mouth at bedtime. 02/22/20    Steele Sizer, MD  sulfamethoxazole-trimethoprim (BACTRIM DS) 800-160 MG tablet Take 1 tablet by mouth 2 (two) times daily. 08/04/20   Steele Sizer, MD  tiZANidine (ZANAFLEX) 4 MG tablet Take 6 mg by mouth 3 (three) times daily. *scheduledNicholaus Bloom, MD    Allergies Patient has no known allergies.  Family History  Problem Relation Age of Onset   Depression Mother    Ulcers Sister    Breast cancer Paternal Grandmother     Social History Social History   Tobacco Use   Smoking status: Former    Packs/day: 1.00    Years: 40.00    Pack years: 40.00    Types: Cigarettes    Quit date: 07/05/2018    Years since quitting: 2.6   Smokeless tobacco: Never  Vaping Use   Vaping Use: Every day   Start date: 07/03/2018   Substances: Flavoring  Substance Use Topics   Alcohol use: Not Currently    Alcohol/week: 0.0 standard drinks    Comment: used to drink heavily , quit in 2007    Drug use: No    Review of Systems  Review of Systems  Constitutional:  Positive for fatigue. Negative for fever.  HENT:  Negative for congestion and sore throat.   Eyes:  Negative for visual disturbance.  Respiratory:  Negative for cough and shortness of breath.   Cardiovascular:  Negative for chest pain.  Gastrointestinal:  Positive for abdominal pain, nausea and vomiting. Negative for diarrhea.  Genitourinary:  Negative for flank pain.  Musculoskeletal:  Negative for back pain and neck pain.  Skin:  Negative for rash and wound.  Neurological:  Positive for weakness.  All other systems reviewed and are negative.   ____________________________________________  PHYSICAL EXAM:      VITAL SIGNS: ED Triage Vitals [02/08/21 0452]  Enc Vitals Group     BP      Pulse Rate 81     Resp 17     Temp 98.6 F (37 C)     Temp Source Oral     SpO2 100 %     Weight      Height      Head Circumference      Peak Flow      Pain Score      Pain Loc      Pain Edu?      Excl. in [place]?       Physical Exam Vitals and nursing note reviewed.  Constitutional:      General: She is not in acute distress.    Appearance: She is well-developed.  HENT:     Head: Normocephalic and atraumatic.  Eyes:     Conjunctiva/sclera: Conjunctivae normal.  Cardiovascular:     Rate and Rhythm: Normal rate and regular rhythm.     Heart sounds: Normal heart sounds. No murmur heard.   No  friction rub.  Pulmonary:     Effort: Pulmonary effort is normal. No respiratory distress.     Breath sounds: Normal breath sounds. No wheezing or rales.  Abdominal:     General: There is no distension.     Palpations: Abdomen is soft.     Tenderness: There is abdominal tenderness in the epigastric area.  Musculoskeletal:     Cervical back: Neck supple.  Skin:    General: Skin is warm.     Capillary Refill: Capillary refill takes less than 2 seconds.  Neurological:     Mental Status: She is alert and oriented to person, place, and time.     Motor: No abnormal muscle tone.      ____________________________________________   LABS (all labs ordered are listed, but only abnormal results are displayed)  Labs Reviewed  CBC WITH DIFFERENTIAL/PLATELET - Abnormal; Notable for the following components:      Result Value   Lymphs Abs 0.4 (*)    All other components within normal limits  COMPREHENSIVE METABOLIC PANEL - Abnormal; Notable for the following components:   Glucose, Bld 166 (*)    BUN 25 (*)    AST 982 (*)    ALT 555 (*)    Total Bilirubin 3.4 (*)    All other components within normal limits  LIPASE, BLOOD - Abnormal; Notable for the following components:   Lipase 69 (*)    All other components within normal limits  ACETAMINOPHEN LEVEL - Abnormal; Notable for the following components:   Acetaminophen (Tylenol), Serum <10 (*)    All other components within normal limits  PROTIME-INR  ETHANOL  URINALYSIS, ROUTINE W REFLEX MICROSCOPIC  HEPATITIS PANEL, ACUTE  TROPONIN I (HIGH  SENSITIVITY)    ____________________________________________  EKG: Normal sinus rhythm, VR 77. PR 148, QRS 93, QTc 437. No acute St elevations or depressions. No ischemia or infarct. ________________________________________  RADIOLOGY All imaging, including plain films, CT scans, and ultrasounds, independently reviewed by me, and interpretations confirmed via formal radiology reads.  ED MD interpretation:   CT A/P:   Official radiology report(s): CT ABDOMEN PELVIS W CONTRAST  Result Date: 02/08/2021 CLINICAL DATA:  Abdominal pain, nausea and vomiting since last evening. EXAM: CT ABDOMEN AND PELVIS WITH CONTRAST TECHNIQUE: Multidetector CT imaging of the abdomen and pelvis was performed using the standard protocol following bolus administration of intravenous contrast. CONTRAST:  17mL OMNIPAQUE IOHEXOL 350 MG/ML SOLN COMPARISON:  CT scan 02/21/2011 FINDINGS: Lower chest: The lung bases are clear of acute process. No pleural effusion or pulmonary lesions. The heart is normal in size. No pericardial effusion. The distal esophagus and aorta are unremarkable. Hepatobiliary: No hepatic lesions are identified. There is mild intrahepatic biliary dilatation centrally along with mild common bile duct dilatation measuring 9 mm in the porta hepatis and 8.5 mm in the head of the pancreas. No obvious gallstones. Slight enhancement of the gallbladder wall and possible mild gallbladder wall thickening. Pancreas: No mass, inflammation or ductal dilatation. Spleen: Normal size. No focal lesions. Adrenals/Urinary Tract: Adrenal glands and kidneys are unremarkable. The bladder is unremarkable. Stomach/Bowel: The stomach, duodenum, small bowel and colon are unremarkable. No acute inflammatory process, mass lesion or obstructive findings. The terminal ileum and appendix are normal. There is scattered colonic diverticulosis but no findings for acute diverticulitis. Vascular/Lymphatic: The aorta is normal in caliber. No  dissection. The branch vessels are patent. The major venous structures are patent. No mesenteric or retroperitoneal mass or adenopathy. Small scattered lymph nodes are  noted. Reproductive: The uterus and ovaries are unremarkable. Other: No pelvic mass or adenopathy. No free pelvic fluid collections. No inguinal mass or adenopathy. No abdominal wall hernia or subcutaneous lesions. Musculoskeletal: No significant bony findings. IMPRESSION: 1. No abdominal mass lesions or adenopathy. 2. Mild intrahepatic and extrahepatic biliary dilatation and possible mild gallbladder inflammation. Recommend correlation with right upper quadrant ultrasound examination. 3. Scattered colonic diverticulosis without findings for acute diverticulitis. Electronically Signed   By: Marijo Sanes M.D.   On: 02/08/2021 06:52    ____________________________________________  PROCEDURES   Procedure(s) performed (including Critical Care):  Procedures  ____________________________________________  INITIAL IMPRESSION / MDM / [place] / ED COURSE  As part of my medical decision making, I reviewed the following data within the [place] notes reviewed and incorporated, Old chart reviewed, Notes from prior ED visits, and [place] [employer] Sub[employer] Anderson was evaluated in Emergency Department on 02/08/2021 for the symptoms described in the history of present illness. She was evaluated in the context of the global COVID-19 pandemic, which necessitated consideration that the patient might be at risk for infection with the SARS-CoV-2 virus that causes COVID-19. Institutional protocols and algorithms that pertain to the evaluation of patients at risk for COVID-19 are in a state of rapid change based on information released by regulatory bodies including the CDC and federal and state organizations. These policies and algorithms were followed during the patient's care in the ED.   Some ED evaluations and interventions may be delayed as a result of limited staffing during the pandemic.*     Medical Decision Making:  60 yo F here with epigastric abd pain, nausea, vomiting. Initial concern for gastritis, gastroparesis, alcoholic gastritis, pancreatitis, cholecystitis. Will check CT. Labs show marked transaminitis in 2:1 distribution, Bili>3, lipase 69. This is concerning for alcoholic hepatitis vs biliary disease. Will f/u CT. IVF given, antiemetics with good effect. No fever or signs of sepsis. EKG nonischemic, trop neg. No leukocytosis. For her hepatitis/transaminitis, APAP, ETOH levels neg. Hepatitis panel sent.  CT scan reviewed, shows intra- and extra-hepatic duct dilatation. Will check u/s. Pt remains nauseous, continue fluids.   ____________________________________________  FINAL CLINICAL IMPRESSION(S) / ED DIAGNOSES  Final diagnoses:  RUQ pain  Transaminitis     MEDICATIONS GIVEN DURING THIS VISIT:  Medications  haloperidol lactate (HALDOL) injection 2.5 mg (2.5 mg Intravenous Given 02/08/21 0521)  diphenhydrAMINE (BENADRYL) injection 25 mg (25 mg Intravenous Given 02/08/21 0521)  alum & mag hydroxide-simeth (MAALOX/MYLANTA) 200-200-20 MG/5ML suspension 30 mL (30 mLs Oral Given 02/08/21 0521)    And  lidocaine (XYLOCAINE) 2 % viscous mouth solution 15 mL (15 mLs Oral Given 02/08/21 0521)  sodium chloride 0.9 % bolus 1,000 mL (1,000 mLs Intravenous New Bag/Given 02/08/21 0534)  iohexol (OMNIPAQUE) 350 MG/ML injection 80 mL (80 mLs Intravenous Contrast Given 02/08/21 0615)     ED Discharge Orders     None        Note:  This document was prepared using Dragon voice recognition software and may include unintentional dictation errors.   Duffy Bruce, MD 02/08/21 [phone]

## 2021-02-08 NOTE — ED Notes (Signed)
Pt from home brought in per EMS for new onset LUQ abd pain associated w/ nausea and vomiting since 6PM last night. Zofran IV given PTA . Pt presents to ED AAOx4, breathing even-unlabored. . Soft abd . Denies CP , urinary symptoms at this time.

## 2021-02-09 DIAGNOSIS — D696 Thrombocytopenia, unspecified: Secondary | ICD-10-CM

## 2021-02-09 DIAGNOSIS — K8050 Calculus of bile duct without cholangitis or cholecystitis without obstruction: Secondary | ICD-10-CM | POA: Diagnosis present

## 2021-02-09 DIAGNOSIS — R748 Abnormal levels of other serum enzymes: Secondary | ICD-10-CM | POA: Diagnosis not present

## 2021-02-09 DIAGNOSIS — K760 Fatty (change of) liver, not elsewhere classified: Secondary | ICD-10-CM

## 2021-02-09 DIAGNOSIS — R739 Hyperglycemia, unspecified: Secondary | ICD-10-CM

## 2021-02-09 DIAGNOSIS — E039 Hypothyroidism, unspecified: Secondary | ICD-10-CM

## 2021-02-09 DIAGNOSIS — K8000 Calculus of gallbladder with acute cholecystitis without obstruction: Secondary | ICD-10-CM | POA: Diagnosis not present

## 2021-02-09 DIAGNOSIS — F3181 Bipolar II disorder: Secondary | ICD-10-CM

## 2021-02-09 DIAGNOSIS — R7401 Elevation of levels of liver transaminase levels: Secondary | ICD-10-CM | POA: Diagnosis not present

## 2021-02-09 DIAGNOSIS — D649 Anemia, unspecified: Secondary | ICD-10-CM

## 2021-02-09 LAB — HEMOGLOBIN AND HEMATOCRIT, BLOOD
HCT: 32 % — ABNORMAL LOW (ref 36.0–46.0)
Hemoglobin: 10.9 g/dL — ABNORMAL LOW (ref 12.0–15.0)

## 2021-02-09 LAB — CBC
HCT: 31.2 % — ABNORMAL LOW (ref 36.0–46.0)
Hemoglobin: 10.8 g/dL — ABNORMAL LOW (ref 12.0–15.0)
MCH: 30.6 pg (ref 26.0–34.0)
MCHC: 34.6 g/dL (ref 30.0–36.0)
MCV: 88.4 fL (ref 80.0–100.0)
Platelets: 126 10*3/uL — ABNORMAL LOW (ref 150–400)
RBC: 3.53 MIL/uL — ABNORMAL LOW (ref 3.87–5.11)
RDW: 13.3 % (ref 11.5–15.5)
WBC: 10.1 10*3/uL (ref 4.0–10.5)
nRBC: 0 % (ref 0.0–0.2)

## 2021-02-09 LAB — COMPREHENSIVE METABOLIC PANEL
ALT: 313 U/L — ABNORMAL HIGH (ref 0–44)
AST: 177 U/L — ABNORMAL HIGH (ref 15–41)
Albumin: 3 g/dL — ABNORMAL LOW (ref 3.5–5.0)
Alkaline Phosphatase: 83 U/L (ref 38–126)
Anion gap: 6 (ref 5–15)
BUN: 14 mg/dL (ref 6–20)
CO2: 23 mmol/L (ref 22–32)
Calcium: 8.3 mg/dL — ABNORMAL LOW (ref 8.9–10.3)
Chloride: 110 mmol/L (ref 98–111)
Creatinine, Ser: 0.9 mg/dL (ref 0.44–1.00)
GFR, Estimated: >60 mL/min (ref >60)
Glucose, Bld: 185 mg/dL — ABNORMAL HIGH (ref 70–99)
Potassium: 3.5 mmol/L (ref 3.5–5.1)
Sodium: 139 mmol/L (ref 135–145)
Total Bilirubin: 3.9 mg/dL — ABNORMAL HIGH (ref 0.3–1.2)
Total Protein: 6 g/dL — ABNORMAL LOW (ref 6.5–8.1)

## 2021-02-09 MED ORDER — SODIUM CHLORIDE 0.9 % IV SOLN: INTRAVENOUS | Status: DC

## 2021-02-09 MED ORDER — OXYCODONE HCL 5 MG PO TABS: 5.0000 mg | ORAL_TABLET | Freq: Four times a day (QID) | ORAL | 0 refills | Status: DC | PRN

## 2021-02-09 MED ORDER — ACETAMINOPHEN 325 MG PO TABS: 650.0000 mg | ORAL_TABLET | Freq: Three times a day (TID) | ORAL | 0 refills | Status: AC | PRN

## 2021-02-09 MED ORDER — IBUPROFEN 800 MG PO TABS: 800.0000 mg | ORAL_TABLET | Freq: Three times a day (TID) | ORAL | 0 refills | Status: DC | PRN

## 2021-02-09 MED ORDER — DOCUSATE SODIUM 100 MG PO CAPS: 100.0000 mg | ORAL_CAPSULE | Freq: Two times a day (BID) | ORAL | 0 refills | Status: AC | PRN

## 2021-02-09 NOTE — Hospital Course (Addendum)
60 year old woman presented with right upper quadrant pain, low-grade fever, elevated LFTs.  CT showed intrahepatic and extrahepatic biliary ductal dilatation, mild gallbladder inflammation suggested, MRCP showed cholelithiasis, no choledocholithiasis. --10/13 admitted for acute cholecystitis, biliary colic --16/96 Robotic assisted Laparoscopic Cholecystectomy.  --10/14 LFTs improved, feels better, tolerating diet

## 2021-02-09 NOTE — Assessment & Plan Note (Signed)
--   Status postcholecystectomy 10/13.  Diet as per surgery, seems to be improving, may be able to go home later today.

## 2021-02-09 NOTE — Progress Notes (Signed)
  Progress Note    Anna Giles   AQT:[id number]  DOB: 01/01/1961  DOA: 02/08/2021     1 Date of Service: 02/09/2021   60 year old woman presented with right upper quadrant pain, low-grade fever, elevated LFTs.  CT showed intrahepatic and extrahepatic biliary ductal dilatation, mild gallbladder inflammation suggested, MRCP showed cholelithiasis, no choledocholithiasis. --10/13 admitted for acute cholecystitis, biliary colic --56/25 Robotic assisted Laparoscopic Cholecystectomy.  --10/14 LFTs improved, feels better, tolerating diet  Assessment and Plan * Calculus of gallbladder with acute cholecystitis without obstruction -- Status postcholecystectomy 10/13.  Diet as per surgery, seems to be improving, may be able to go home later today.  Transaminitis --Acute hepatitis panel negative, HIV nonreactive, ethanol level negative, Tylenol normal.  Reported history of fatty liver.  Differential includes ischemic hepatitis. --Markedly improved today.  Total bilirubin slightly elevated compared to yesterday.  Thrombocytopenia ([place]) -- Probably secondary to acute infection, illness, follow clinically, follow-up as an outpatient to ensure resolution  Hyperglycemia --Secondary to acute illness.  Hemoglobin A1c 5.5.  Follow-up as an outpatient.  No treatment indicated.  Anemia -- Suspect dilutional in nature, however will repeat CBC later today  Adult hypothyroidism -- Continue levothyroxine  Fatty liver disease, nonalcoholic -- Reported by history, may explain elevated LFTs, follow-up with GI as an outpatient  Bipolar 2 disorder ([place]) -- Stable.  Continue Lexapro and Seroquel  Subjective:  Feels better, some pain, tolerating liquids  Objective Vitals:   02/08/21 2046 02/09/21 0047 02/09/21 0428 02/09/21 0820  BP: 102/71 95/61 (!) 87/63 (!) 85/55  Pulse: 72 65 (!) 59 (!) 57  Resp: 14 18 18 18   Temp: 98.7 F (37.1 C) 97.8 F (36.6 C) 98.7 F (37.1 C) 98.3 F (36.8 C)   TempSrc:      SpO2: 100% 96% 97% 99%  Weight: 65.8 kg     Height: 5\' 4"  (1.626 m)      65.8 kg  Vital signs were reviewed and unremarkable except for: borderlin low BP  Exam Physical Exam Vitals reviewed.  Constitutional:      General: She is not in acute distress.    Appearance: She is not ill-appearing or toxic-appearing.  Cardiovascular:     Rate and Rhythm: Normal rate and regular rhythm.     Heart sounds: No murmur heard.    Comments: Telemetry SR Pulmonary:     Effort: Pulmonary effort is normal. No respiratory distress.     Breath sounds: No wheezing or rales.  Neurological:     Mental Status: She is alert.  Psychiatric:        Mood and Affect: Mood normal.        Behavior: Behavior normal.    Labs / Other Information My review of labs, imaging, notes and other tests is significant for    AST and ALT trending down, total bilirubin slightly higher at 3.9, hemoglobin down to 10.8  Disposition Plan: Status is: Inpatient  Remains inpatient appropriate because: Status post surgery, diet advanced, will see if she tolerates, may be able to go home later today if cleared by GI and surgery  Time spent: 35 minutes [facility] 02/09/2021, 10:20 AM

## 2021-02-09 NOTE — Anesthesia Postprocedure Evaluation (Signed)
Anesthesia Post Note  Patient: Anna Giles  Procedure(s) Performed: XI ROBOTIC ASSISTED LAPAROSCOPIC CHOLECYSTECTOMY INDOCYANINE GREEN FLUORESCENCE IMAGING (ICG)  Patient location during evaluation: PACU Anesthesia Type: General Level of consciousness: awake and alert Pain management: pain level controlled Vital Signs Assessment: post-procedure vital signs reviewed and stable Respiratory status: spontaneous breathing, nonlabored ventilation, respiratory function stable and patient connected to nasal cannula oxygen Cardiovascular status: blood pressure returned to baseline and stable Postop Assessment: no apparent nausea or vomiting Anesthetic complications: no   No notable events documented.   Last Vitals:  Vitals:   02/08/21 2046 02/09/21 0047  BP: 102/71 95/61  Pulse: 72 65  Resp: 14 18  Temp: 37.1 C 36.6 C  SpO2: 100% 96%    Last Pain:  Vitals:   02/09/21 0047  TempSrc:   PainSc: Asleep                 Martha Clan

## 2021-02-09 NOTE — Assessment & Plan Note (Signed)
--   Probably secondary to acute infection, illness, follow clinically, follow-up as an outpatient to ensure resolution

## 2021-02-09 NOTE — Assessment & Plan Note (Signed)
--   Suspect dilutional in nature, however will repeat CBC later today

## 2021-02-09 NOTE — Assessment & Plan Note (Signed)
--   Stable.  Continue Lexapro and Seroquel

## 2021-02-09 NOTE — Assessment & Plan Note (Signed)
Continue levothyroxine 

## 2021-02-09 NOTE — Assessment & Plan Note (Signed)
--   Reported by history, may explain elevated LFTs, follow-up with GI as an outpatient

## 2021-02-09 NOTE — Plan of Care (Signed)
Patient discharged per MD orders at this time.All discharge instructions,education and medications reviewed with patient at the bedside.Pt expressed understanding and will comply with dc instructions.follow up appointments was also communicated to patient.no verbal c/o or any ssx of distress.Patient was transported home by spouse in a private car.

## 2021-02-09 NOTE — Progress Notes (Signed)
Anna Antigua, MD [address], [place], [place], [place], [address] [address], [place], [place], [place], [address] Phone: [phone]  Fax: [phone]   Subjective: Patient resting in bed comfortably.  Describes some discomfort since the surgery but not as much pain as she had prior to presentation.  No nausea or vomiting.  No fever or chills.   Objective: Exam: Vital signs in last 24 hours: Vitals:   02/09/21 0047 02/09/21 0428 02/09/21 0820 02/09/21 1212  BP: 95/61 (!) 87/63 (!) 85/55 (!) 87/67  Pulse: 65 (!) 59 (!) 57 69  Resp: $Remo'18 18 18 15  'oEKMl$ Temp: 97.8 F (36.6 C) 98.7 F (37.1 C) 98.3 F (36.8 C) 98.4 F (36.9 C)  TempSrc:      SpO2: 96% 97% 99% 97%  Weight:      Height:       Weight change:   Intake/Output Summary (Last 24 hours) at 02/09/2021 1343 Last data filed at 02/09/2021 1027 Gross per 24 hour  Intake 2388.92 ml  Output --  Net 2388.92 ml    Constitutional: General:   Alert,  Well-developed, well-nourished, pleasant and cooperative in NAD BP (!) 87/67 (BP Location: Left Arm)   Pulse 69   Temp 98.4 F (36.9 C)   Resp 15   Ht $R'5\' 4"'eH$  (1.626 m)   Wt 65.8 kg   SpO2 97%   BMI 24.90 kg/m   Eyes:  Sclera clear, no icterus.   Conjunctiva pink.   Ears:  No scars, lesions or masses, Normal auditory acuity. Nose:  No deformity, discharge, or lesions. Mouth:  No deformity or lesions, oropharynx pink & moist.  Neck:  Supple; no masses, trachea midline  Respiratory: Normal respiratory effort  Gastrointestinal:  Soft, non-tender and non-distended without masses, hepatosplenomegaly or hernias noted.  No guarding or rebound tenderness.     Cardiac: No clubbing or edema.  No cyanosis. Normal posterior tibial pedal pulses noted.  Lymphatic:  No significant cervical adenopathy.  Psych:  Alert and cooperative. Normal mood and affect.  Musculoskeletal:  Head normocephalic, atraumatic. 5/5 Lower extremity strength bilaterally.  Skin: Warm.  Intact without significant lesions or rashes. No jaundice.  Neurologic:  Face symmetrical, tongue midline, Normal sensation to touch  Psych:  Alert and oriented x3, Alert and cooperative. Normal mood and affect.   Lab Results: Lab Results  Component Value Date   WBC 10.1 02/09/2021   HGB 10.9 (L) 02/09/2021   HCT 32.0 (L) 02/09/2021   MCV 88.4 02/09/2021   PLT 126 (L) 02/09/2021   Micro Results: Recent Results (from the past 240 hour(s))  Resp Panel by RT-PCR (Flu A&B, Covid) Urine, Clean Catch     Status: None   Collection Time: 02/08/21 12:50 PM   Specimen: Urine, Clean Catch; Nasopharyngeal(NP) swabs in vial transport medium  Result Value Ref Range Status   SARS Coronavirus 2 by RT PCR NEGATIVE NEGATIVE Final    Comment: (NOTE) SARS-CoV-2 target nucleic acids are NOT DETECTED.  The SARS-CoV-2 RNA is generally detectable in upper respiratory specimens during the acute phase of infection. The lowest concentration of SARS-CoV-2 viral copies this assay can detect is 138 copies/mL. A negative result does not preclude SARS-Cov-2 infection and should not be used as the sole basis for treatment or other patient management decisions. A negative result may occur with  improper specimen collection/handling, submission of specimen other than nasopharyngeal swab, presence of viral mutation(s) within the areas targeted by this assay, and inadequate number of viral  copies(<138 copies/mL). A negative result must be combined with clinical observations, patient history, and epidemiological information. The expected result is Negative.  Fact Sheet for Patients:  EntrepreneurPulse.com.au  Fact Sheet for Healthcare Providers:  IncredibleEmployment.be  This test is no t yet approved or cleared by the [place] FDA and  has been authorized for detection and/or diagnosis of SARS-CoV-2 by FDA under an Emergency Use Authorization (EUA). This EUA will  remain  in effect (meaning this test can be used) for the duration of the COVID-19 declaration under Section 564(b)(1) of the Act, 21 U.S.C.section 360bbb-3(b)(1), unless the authorization is terminated  or revoked sooner.       Influenza A by PCR NEGATIVE NEGATIVE Final   Influenza B by PCR NEGATIVE NEGATIVE Final    Comment: (NOTE) The Xpert Xpress SARS-CoV-2/FLU/RSV plus assay is intended as an aid in the diagnosis of influenza from Nasopharyngeal swab specimens and should not be used as a sole basis for treatment. Nasal washings and aspirates are unacceptable for Xpert Xpress SARS-CoV-2/FLU/RSV testing.  Fact Sheet for Patients: EntrepreneurPulse.com.au  Fact Sheet for Healthcare Providers: IncredibleEmployment.be  This test is not yet approved or cleared by the [place] FDA and has been authorized for detection and/or diagnosis of SARS-CoV-2 by FDA under an Emergency Use Authorization (EUA). This EUA will remain in effect (meaning this test can be used) for the duration of the COVID-19 declaration under Section 564(b)(1) of the Act, 21 U.S.C. section 360bbb-3(b)(1), unless the authorization is terminated or revoked.  Performed at [facility], [address] [place], [place] [address]   CULTURE, BLOOD (ROUTINE X 2) w Reflex to ID Panel     Status: None (Preliminary result)   Collection Time: 02/08/21  1:48 PM   Specimen: BLOOD  Result Value Ref Range Status   Specimen Description BLOOD LEFT AC  Final   Special Requests   Final    BOTTLES DRAWN AEROBIC AND ANAEROBIC Blood Culture adequate volume   Culture   Final    NO GROWTH < 24 HOURS Performed at [facility], [address], [place], [place] [address]    Report Status PENDING  Incomplete  CULTURE, BLOOD (ROUTINE X 2) w Reflex to ID Panel     Status: None (Preliminary result)   Collection Time: 02/08/21  1:48 PM   Specimen: BLOOD LEFT HAND  Result  Value Ref Range Status   Specimen Description BLOOD LEFT HAND  Final   Special Requests   Final    BOTTLES DRAWN AEROBIC AND ANAEROBIC Blood Culture adequate volume   Culture   Final    NO GROWTH < 24 HOURS Performed at [facility], [address] [place], [place] [address]    Report Status PENDING  Incomplete   Studies/Results: CT ABDOMEN PELVIS W CONTRAST  Result Date: 02/08/2021 CLINICAL DATA:  Abdominal pain, nausea and vomiting since last evening. EXAM: CT ABDOMEN AND PELVIS WITH CONTRAST TECHNIQUE: Multidetector CT imaging of the abdomen and pelvis was performed using the standard protocol following bolus administration of intravenous contrast. CONTRAST:  58mL OMNIPAQUE IOHEXOL 350 MG/ML SOLN COMPARISON:  CT scan 02/21/2011 FINDINGS: Lower chest: The lung bases are clear of acute process. No pleural effusion or pulmonary lesions. The heart is normal in size. No pericardial effusion. The distal esophagus and aorta are unremarkable. Hepatobiliary: No hepatic lesions are identified. There is mild intrahepatic biliary dilatation centrally along with mild common bile duct dilatation measuring 9 mm in the porta hepatis and 8.5  mm in the head of the pancreas. No obvious gallstones. Slight enhancement of the gallbladder wall and possible mild gallbladder wall thickening. Pancreas: No mass, inflammation or ductal dilatation. Spleen: Normal size. No focal lesions. Adrenals/Urinary Tract: Adrenal glands and kidneys are unremarkable. The bladder is unremarkable. Stomach/Bowel: The stomach, duodenum, small bowel and colon are unremarkable. No acute inflammatory process, mass lesion or obstructive findings. The terminal ileum and appendix are normal. There is scattered colonic diverticulosis but no findings for acute diverticulitis. Vascular/Lymphatic: The aorta is normal in caliber. No dissection. The branch vessels are patent. The major venous structures are patent. No mesenteric or  retroperitoneal mass or adenopathy. Small scattered lymph nodes are noted. Reproductive: The uterus and ovaries are unremarkable. Other: No pelvic mass or adenopathy. No free pelvic fluid collections. No inguinal mass or adenopathy. No abdominal wall hernia or subcutaneous lesions. Musculoskeletal: No significant bony findings. IMPRESSION: 1. No abdominal mass lesions or adenopathy. 2. Mild intrahepatic and extrahepatic biliary dilatation and possible mild gallbladder inflammation. Recommend correlation with right upper quadrant ultrasound examination. 3. Scattered colonic diverticulosis without findings for acute diverticulitis. Electronically Signed   By: Marijo Sanes M.D.   On: 02/08/2021 06:52   MR 3D Recon At Scanner  Result Date: 02/08/2021 CLINICAL DATA:  Severe epigastric and abdominal pain. Nausea and vomiting. Alcohol misuse. Cholelithiasis EXAM: MRI ABDOMEN WITHOUT AND WITH CONTRAST (INCLUDING MRCP) TECHNIQUE: Multiplanar multisequence MR imaging of the abdomen was performed both before and after the administration of intravenous contrast. Heavily T2-weighted images of the biliary and pancreatic ducts were obtained, and three-dimensional MRCP images were rendered by post processing. CONTRAST:  62mL GADAVIST GADOBUTROL 1 MMOL/ML IV SOLN COMPARISON:  CT on 02/08/2021, and chest CT on 01/05/2019 FINDINGS: Lower chest: Sub-cm T2 hyperintense pulmonary nodule in the posterior right lung base is stable compared to previous chest CT in 2020, consistent with benign etiology. Hepatobiliary: No hepatic masses identified. No evidence of steatosis. A few tiny less than 1 cm gallstones are noted. No evidence of gallbladder wall thickening or pericholecystic inflammatory changes. No evidence of biliary ductal dilatation with common bile duct measuring 5 mm. No evidence of choledocholithiasis. Pancreas: No mass or inflammatory changes. No evidence of pancreatic ductal dilatation. Spleen:  Within normal limits in  size and appearance. Adrenals/Urinary Tract: No masses identified. No evidence of hydronephrosis. Stomach/Bowel: Visualized portion unremarkable. Vascular/Lymphatic: No pathologically enlarged lymph nodes identified. No acute vascular findings. Other:  None. Musculoskeletal:  No suspicious bone lesions identified. IMPRESSION: Cholelithiasis. No evidence of acute cholecystitis, biliary ductal dilatation, or other acute findings. Electronically Signed   By: Marlaine Hind M.D.   On: 02/08/2021 11:23   MR ABDOMEN MRCP W WO CONTAST  Result Date: 02/08/2021 CLINICAL DATA:  Severe epigastric and abdominal pain. Nausea and vomiting. Alcohol misuse. Cholelithiasis EXAM: MRI ABDOMEN WITHOUT AND WITH CONTRAST (INCLUDING MRCP) TECHNIQUE: Multiplanar multisequence MR imaging of the abdomen was performed both before and after the administration of intravenous contrast. Heavily T2-weighted images of the biliary and pancreatic ducts were obtained, and three-dimensional MRCP images were rendered by post processing. CONTRAST:  32mL GADAVIST GADOBUTROL 1 MMOL/ML IV SOLN COMPARISON:  CT on 02/08/2021, and chest CT on 01/05/2019 FINDINGS: Lower chest: Sub-cm T2 hyperintense pulmonary nodule in the posterior right lung base is stable compared to previous chest CT in 2020, consistent with benign etiology. Hepatobiliary: No hepatic masses identified. No evidence of steatosis. A few tiny less than 1 cm gallstones are noted. No evidence of gallbladder wall thickening or pericholecystic  inflammatory changes. No evidence of biliary ductal dilatation with common bile duct measuring 5 mm. No evidence of choledocholithiasis. Pancreas: No mass or inflammatory changes. No evidence of pancreatic ductal dilatation. Spleen:  Within normal limits in size and appearance. Adrenals/Urinary Tract: No masses identified. No evidence of hydronephrosis. Stomach/Bowel: Visualized portion unremarkable. Vascular/Lymphatic: No pathologically enlarged lymph  nodes identified. No acute vascular findings. Other:  None. Musculoskeletal:  No suspicious bone lesions identified. IMPRESSION: Cholelithiasis. No evidence of acute cholecystitis, biliary ductal dilatation, or other acute findings. Electronically Signed   By: Marlaine Hind M.D.   On: 02/08/2021 11:23   US Abdomen Limited RUQ (LIVER/GB)  Result Date: 02/08/2021 CLINICAL DATA:  Right upper quadrant pain EXAM: ULTRASOUND ABDOMEN LIMITED RIGHT UPPER QUADRANT COMPARISON:  Same day CT abdomen/pelvis, abdominal ultrasound 01/07/2014 FINDINGS: Gallbladder: There are small layering stones and sludge within the gallbladder lumen. There is mild gallbladder wall thickening measuring up to 5 mm. There is no pericholecystic fluid. Sonographic Percell Miller sign was reported to be negative. Common bile duct: Diameter: 6 mm. There is mild intrahepatic biliary ductal dilatation. Liver: No focal lesion identified. Within normal limits in parenchymal echogenicity. Portal vein is patent on color Doppler imaging with normal direction of blood flow towards the liver. Other: None. IMPRESSION: 1. Small layering stones and sludge in the gallbladder lumen with mild wall thickening. Sonographic Murphy sign was negative. If there is clinical concern for acute cholecystitis, HIDA scan may be obtained. 2. Mild intra and extrahepatic biliary ductal dilatation. Correlate with LFTs. This may be further evaluated with MRCP as indicated. This could be performed on a nonemergent outpatient basis. Electronically Signed   By: Valetta Mole M.D.   On: 02/08/2021 08:50   Medications:  Scheduled Meds:  busPIRone  10 mg Oral BID   escitalopram  10 mg Oral QHS   levothyroxine  88 mcg Oral Q0600   pantoprazole  40 mg Oral Daily   QUEtiapine  25 mg Oral QHS   tiZANidine  6 mg Oral TID   Continuous Infusions: PRN Meds:.bisacodyl, HYDROmorphone (DILAUDID) injection, ibuprofen, ondansetron **OR** ondansetron (ZOFRAN) IV,  oxyCODONE   Assessment: Principal Problem:   Calculus of gallbladder with acute cholecystitis without obstruction Active Problems:   Bipolar 2 disorder (HCC)   Fatty liver disease, nonalcoholic   Adult hypothyroidism   Transaminitis   Thrombocytopenia (HCC)   Anemia   Hyperglycemia   Colic, biliary Elevated liver enzymes   Plan: Patient is status post cholecystectomy and is doing well post surgery  Alkaline phosphatase remains normal, but total bilirubin remains elevated as before.  With normal alk phos, and MRCP not showing CBD dilation or choledocholithiasis, bilirubin may be slow to improve, not from CBD obstruction at this time  Transaminases have improved significantly and were likely due to ischemic hepatitis  Continue to avoid hepatotoxic drugs Monitor liver enzymes closely and if worsen or do not improve in the next few days to week, further work-up may need to be ordered  Close follow-up in GI clinic on discharge as well   LOS: 1 day   Anna Antigua, MD 02/09/2021, 1:43 PM

## 2021-02-09 NOTE — Discharge Summary (Signed)
Physician Discharge Summary   Patient name: Anna Giles  Admit date:     02/08/2021  Discharge date: 02/09/2021  Discharge Physician: Murray Hodgkins   PCP: Steele Sizer, MD   Recommendations at discharge:  Status postcholecystectomy Elevated total bilirubin Elevated transaminases Thrombocytopenia  Hyperglycemia  Discharge Diagnoses Principal Problem:   Calculus of gallbladder with acute cholecystitis without obstruction Active Problems:   Colic, biliary   Transaminitis   Thrombocytopenia (HCC)   Bipolar 2 disorder (HCC)   Fatty liver disease, nonalcoholic   Adult hypothyroidism   Anemia   Hyperglycemia  Resolved Diagnoses Resolved Problems:   Biliary colic   Colic, biliary  Hospital Course   60 year old woman presented with right upper quadrant pain, low-grade fever, elevated LFTs.  CT showed intrahepatic and extrahepatic biliary ductal dilatation, mild gallbladder inflammation suggested, MRCP showed cholelithiasis, no choledocholithiasis. --10/13 admitted for acute cholecystitis, biliary colic --40/98 Robotic assisted Laparoscopic Cholecystectomy.  --10/14 LFTs improved, feels better, tolerating diet   * Calculus of gallbladder with acute cholecystitis without obstruction -- Status postcholecystectomy 10/13.  Tolerating diet  Transaminitis --Acute hepatitis panel negative, HIV nonreactive, ethanol level negative, Tylenol normal.  Reported history of fatty liver.  Differential includes ischemic hepatitis. --Markedly improved today.  Total bilirubin slightly elevated compared to yesterday. Discussed with GI, ok for discharge today w/ close outpatient follow-up  Thrombocytopenia ([place]) -- Probably secondary to acute infection, illness, follow clinically, follow-up as an outpatient to ensure resolution  Hyperglycemia --Secondary to acute illness.  Hemoglobin A1c 5.5.  Follow-up as an outpatient.  No treatment indicated.  Anemia -- Suspect dilutional in  nature, however will repeat CBC later today  Adult hypothyroidism -- Continue levothyroxine  Fatty liver disease, nonalcoholic -- Reported by history, may explain elevated LFTs, follow-up with GI as an outpatient  Bipolar 2 disorder ([place]) -- Stable.  Continue Lexapro and Seroquel  Procedures performed: as above   Condition at discharge: good  Exam See progress note same day  Disposition: Home  Discharge time: greater than 30 minutes.  Follow-up Information     [place], Isami, DO. Go on 02/26/2021.   Specialty: Surgery Why: @ 8:30am post op lap chole Contact information: [address] [place] [address] [phone]         Virgel Manifold, MD. Schedule an appointment as soon as possible for a visit in 1 week(s).   Specialty: Gastroenterology Contact information: [place] [address] [phone]         Steele Sizer, MD. Schedule an appointment as soon as possible for a visit in 1 week(s).   Specialty: Family Medicine Contact information: [address] [place] [address] [phone]                 Allergies as of 02/09/2021   No Known Allergies      Medication List     STOP taking these medications    amoxicillin-clavulanate 875-125 MG tablet Commonly known as: AUGMENTIN   oxyCODONE-acetaminophen 5-325 MG tablet Commonly known as: PERCOCET/ROXICET   sulfamethoxazole-trimethoprim 800-160 MG tablet Commonly known as: BACTRIM DS       TAKE these medications    acetaminophen 325 MG tablet Commonly known as: Tylenol Take 2 tablets (650 mg total) by mouth every 8 (eight) hours as needed for mild pain.   busPIRone 10 MG tablet Commonly known as: BUSPAR TAKE ONE TABLET BY MOUTH TWICE A DAY   docusate sodium 100 MG capsule Commonly known as: Colace Take 1  capsule (100 mg total) by mouth 2 (two) times daily as needed for up to 10 days for mild constipation.   escitalopram 10 MG  tablet Commonly known as: LEXAPRO TAKE ONE TABLET BY MOUTH EVERY NIGHT AT BEDTIME   ibuprofen 800 MG tablet Commonly known as: ADVIL Take 1 tablet (800 mg total) by mouth every 8 (eight) hours as needed for mild pain or moderate pain.   levothyroxine 88 MCG tablet Commonly known as: SYNTHROID TAKE ONE TABLET BY MOUTH DAILY WITH BREAKFAST   morphine 15 MG tablet Commonly known as: MSIR Take 15 mg by mouth every 6 (six) hours as needed. What changed: Another medication with the same name was removed. Continue taking this medication, and follow the directions you see here.   oxyCODONE 5 MG immediate release tablet Commonly known as: Oxy IR/ROXICODONE Take 1 tablet (5 mg total) by mouth every 6 (six) hours as needed for breakthrough pain. What changed:  when to take this reasons to take this   pantoprazole 40 MG tablet Commonly known as: PROTONIX TAKE 1 TABLET BY MOUTH DAILY BEFORE SUPPER.   polyethylene glycol 17 g packet Commonly known as: MIRALAX / GLYCOLAX Take 17 g by mouth daily as needed (constipation).   QUEtiapine 25 MG tablet Commonly known as: SEROQUEL Take 1 tablet (25 mg total) by mouth at bedtime.   tiZANidine 4 MG tablet Commonly known as: ZANAFLEX Take 6 mg by mouth 3 (three) times daily. *scheduled*   VITAMIN D3 PO Take 1 tablet by mouth daily.               Discharge Care Instructions  (From admission, onward)           Start     Ordered   02/09/21 0000  Discharge wound care:       Comments: Wound care as per surgery   02/09/21 1339            CT ABDOMEN PELVIS W CONTRAST  Result Date: 02/08/2021 CLINICAL DATA:  Abdominal pain, nausea and vomiting since last evening. EXAM: CT ABDOMEN AND PELVIS WITH CONTRAST TECHNIQUE: Multidetector CT imaging of the abdomen and pelvis was performed using the standard protocol following bolus administration of intravenous contrast. CONTRAST:  77mL OMNIPAQUE IOHEXOL 350 MG/ML SOLN COMPARISON:  CT  scan 02/21/2011 FINDINGS: Lower chest: The lung bases are clear of acute process. No pleural effusion or pulmonary lesions. The heart is normal in size. No pericardial effusion. The distal esophagus and aorta are unremarkable. Hepatobiliary: No hepatic lesions are identified. There is mild intrahepatic biliary dilatation centrally along with mild common bile duct dilatation measuring 9 mm in the porta hepatis and 8.5 mm in the head of the pancreas. No obvious gallstones. Slight enhancement of the gallbladder wall and possible mild gallbladder wall thickening. Pancreas: No mass, inflammation or ductal dilatation. Spleen: Normal size. No focal lesions. Adrenals/Urinary Tract: Adrenal glands and kidneys are unremarkable. The bladder is unremarkable. Stomach/Bowel: The stomach, duodenum, small bowel and colon are unremarkable. No acute inflammatory process, mass lesion or obstructive findings. The terminal ileum and appendix are normal. There is scattered colonic diverticulosis but no findings for acute diverticulitis. Vascular/Lymphatic: The aorta is normal in caliber. No dissection. The branch vessels are patent. The major venous structures are patent. No mesenteric or retroperitoneal mass or adenopathy. Small scattered lymph nodes are noted. Reproductive: The uterus and ovaries are unremarkable. Other: No pelvic mass or adenopathy. No free pelvic fluid collections. No inguinal mass or adenopathy. No abdominal  wall hernia or subcutaneous lesions. Musculoskeletal: No significant bony findings. IMPRESSION: 1. No abdominal mass lesions or adenopathy. 2. Mild intrahepatic and extrahepatic biliary dilatation and possible mild gallbladder inflammation. Recommend correlation with right upper quadrant ultrasound examination. 3. Scattered colonic diverticulosis without findings for acute diverticulitis. Electronically Signed   By: Marijo Sanes M.D.   On: 02/08/2021 06:52   MR 3D Recon At Scanner  Result Date:  02/08/2021 CLINICAL DATA:  Severe epigastric and abdominal pain. Nausea and vomiting. Alcohol misuse. Cholelithiasis EXAM: MRI ABDOMEN WITHOUT AND WITH CONTRAST (INCLUDING MRCP) TECHNIQUE: Multiplanar multisequence MR imaging of the abdomen was performed both before and after the administration of intravenous contrast. Heavily T2-weighted images of the biliary and pancreatic ducts were obtained, and three-dimensional MRCP images were rendered by post processing. CONTRAST:  32mL GADAVIST GADOBUTROL 1 MMOL/ML IV SOLN COMPARISON:  CT on 02/08/2021, and chest CT on 01/05/2019 FINDINGS: Lower chest: Sub-cm T2 hyperintense pulmonary nodule in the posterior right lung base is stable compared to previous chest CT in 2020, consistent with benign etiology. Hepatobiliary: No hepatic masses identified. No evidence of steatosis. A few tiny less than 1 cm gallstones are noted. No evidence of gallbladder wall thickening or pericholecystic inflammatory changes. No evidence of biliary ductal dilatation with common bile duct measuring 5 mm. No evidence of choledocholithiasis. Pancreas: No mass or inflammatory changes. No evidence of pancreatic ductal dilatation. Spleen:  Within normal limits in size and appearance. Adrenals/Urinary Tract: No masses identified. No evidence of hydronephrosis. Stomach/Bowel: Visualized portion unremarkable. Vascular/Lymphatic: No pathologically enlarged lymph nodes identified. No acute vascular findings. Other:  None. Musculoskeletal:  No suspicious bone lesions identified. IMPRESSION: Cholelithiasis. No evidence of acute cholecystitis, biliary ductal dilatation, or other acute findings. Electronically Signed   By: Marlaine Hind M.D.   On: 02/08/2021 11:23   MR ABDOMEN MRCP W WO CONTAST  Result Date: 02/08/2021 CLINICAL DATA:  Severe epigastric and abdominal pain. Nausea and vomiting. Alcohol misuse. Cholelithiasis EXAM: MRI ABDOMEN WITHOUT AND WITH CONTRAST (INCLUDING MRCP) TECHNIQUE: Multiplanar  multisequence MR imaging of the abdomen was performed both before and after the administration of intravenous contrast. Heavily T2-weighted images of the biliary and pancreatic ducts were obtained, and three-dimensional MRCP images were rendered by post processing. CONTRAST:  35mL GADAVIST GADOBUTROL 1 MMOL/ML IV SOLN COMPARISON:  CT on 02/08/2021, and chest CT on 01/05/2019 FINDINGS: Lower chest: Sub-cm T2 hyperintense pulmonary nodule in the posterior right lung base is stable compared to previous chest CT in 2020, consistent with benign etiology. Hepatobiliary: No hepatic masses identified. No evidence of steatosis. A few tiny less than 1 cm gallstones are noted. No evidence of gallbladder wall thickening or pericholecystic inflammatory changes. No evidence of biliary ductal dilatation with common bile duct measuring 5 mm. No evidence of choledocholithiasis. Pancreas: No mass or inflammatory changes. No evidence of pancreatic ductal dilatation. Spleen:  Within normal limits in size and appearance. Adrenals/Urinary Tract: No masses identified. No evidence of hydronephrosis. Stomach/Bowel: Visualized portion unremarkable. Vascular/Lymphatic: No pathologically enlarged lymph nodes identified. No acute vascular findings. Other:  None. Musculoskeletal:  No suspicious bone lesions identified. IMPRESSION: Cholelithiasis. No evidence of acute cholecystitis, biliary ductal dilatation, or other acute findings. Electronically Signed   By: Marlaine Hind M.D.   On: 02/08/2021 11:23   US Abdomen Limited RUQ (LIVER/GB)  Result Date: 02/08/2021 CLINICAL DATA:  Right upper quadrant pain EXAM: ULTRASOUND ABDOMEN LIMITED RIGHT UPPER QUADRANT COMPARISON:  Same day CT abdomen/pelvis, abdominal ultrasound 01/07/2014 FINDINGS: Gallbladder: There are small layering  stones and sludge within the gallbladder lumen. There is mild gallbladder wall thickening measuring up to 5 mm. There is no pericholecystic fluid. Sonographic Percell Miller sign  was reported to be negative. Common bile duct: Diameter: 6 mm. There is mild intrahepatic biliary ductal dilatation. Liver: No focal lesion identified. Within normal limits in parenchymal echogenicity. Portal vein is patent on color Doppler imaging with normal direction of blood flow towards the liver. Other: None. IMPRESSION: 1. Small layering stones and sludge in the gallbladder lumen with mild wall thickening. Sonographic Murphy sign was negative. If there is clinical concern for acute cholecystitis, HIDA scan may be obtained. 2. Mild intra and extrahepatic biliary ductal dilatation. Correlate with LFTs. This may be further evaluated with MRCP as indicated. This could be performed on a nonemergent outpatient basis. Electronically Signed   By: Valetta Mole M.D.   On: 02/08/2021 08:50   Results for orders placed or performed during the hospital encounter of 02/08/21  Resp Panel by RT-PCR (Flu A&B, Covid) Urine, Clean Catch     Status: None   Collection Time: 02/08/21 12:50 PM   Specimen: Urine, Clean Catch; Nasopharyngeal(NP) swabs in vial transport medium  Result Value Ref Range Status   SARS Coronavirus 2 by RT PCR NEGATIVE NEGATIVE Final    Comment: (NOTE) SARS-CoV-2 target nucleic acids are NOT DETECTED.  The SARS-CoV-2 RNA is generally detectable in upper respiratory specimens during the acute phase of infection. The lowest concentration of SARS-CoV-2 viral copies this assay can detect is 138 copies/mL. A negative result does not preclude SARS-Cov-2 infection and should not be used as the sole basis for treatment or other patient management decisions. A negative result may occur with  improper specimen collection/handling, submission of specimen other than nasopharyngeal swab, presence of viral mutation(s) within the areas targeted by this assay, and inadequate number of viral copies(<138 copies/mL). A negative result must be combined with clinical observations, patient history, and  epidemiological information. The expected result is Negative.  Fact Sheet for Patients:  EntrepreneurPulse.com.au  Fact Sheet for Healthcare Providers:  IncredibleEmployment.be  This test is no t yet approved or cleared by the [place] FDA and  has been authorized for detection and/or diagnosis of SARS-CoV-2 by FDA under an Emergency Use Authorization (EUA). This EUA will remain  in effect (meaning this test can be used) for the duration of the COVID-19 declaration under Section 564(b)(1) of the Act, 21 U.S.C.section 360bbb-3(b)(1), unless the authorization is terminated  or revoked sooner.       Influenza A by PCR NEGATIVE NEGATIVE Final   Influenza B by PCR NEGATIVE NEGATIVE Final    Comment: (NOTE) The Xpert Xpress SARS-CoV-2/FLU/RSV plus assay is intended as an aid in the diagnosis of influenza from Nasopharyngeal swab specimens and should not be used as a sole basis for treatment. Nasal washings and aspirates are unacceptable for Xpert Xpress SARS-CoV-2/FLU/RSV testing.  Fact Sheet for Patients: EntrepreneurPulse.com.au  Fact Sheet for Healthcare Providers: IncredibleEmployment.be  This test is not yet approved or cleared by the [place] FDA and has been authorized for detection and/or diagnosis of SARS-CoV-2 by FDA under an Emergency Use Authorization (EUA). This EUA will remain in effect (meaning this test can be used) for the duration of the COVID-19 declaration under Section 564(b)(1) of the Act, 21 U.S.C. section 360bbb-3(b)(1), unless the authorization is terminated or revoked.  Performed at [facility], [address] [place], [place] [address]   CULTURE, BLOOD (ROUTINE X 2) w Reflex to  ID Panel     Status: None (Preliminary result)   Collection Time: 02/08/21  1:48 PM   Specimen: BLOOD  Result Value Ref Range Status   Specimen Description BLOOD LEFT AC  Final    Special Requests   Final    BOTTLES DRAWN AEROBIC AND ANAEROBIC Blood Culture adequate volume   Culture   Final    NO GROWTH < 24 HOURS Performed at [facility], [address] [place], [place] [address]    Report Status PENDING  Incomplete  CULTURE, BLOOD (ROUTINE X 2) w Reflex to ID Panel     Status: None (Preliminary result)   Collection Time: 02/08/21  1:48 PM   Specimen: BLOOD LEFT HAND  Result Value Ref Range Status   Specimen Description BLOOD LEFT HAND  Final   Special Requests   Final    BOTTLES DRAWN AEROBIC AND ANAEROBIC Blood Culture adequate volume   Culture   Final    NO GROWTH < 24 HOURS Performed at [facility], [address], [place], [place] [address]    Report Status PENDING  Incomplete    Signed:  Murray Hodgkins MD.  [facility] 02/09/2021, 3:06 PM

## 2021-02-09 NOTE — Care Management CC44 (Signed)
Condition Code 44 Documentation Completed  Patient Details  Name: [name] MRN: [id number] Date of Birth: 12-11-1960   Condition Code 44 given:  Yes  Patient signature on Condition Code 44 notice:  Yes  Documentation of 2 MD's agreement:   Yes Code 44 added to claim:  Yes     Pete Pelt, RN 02/09/2021, 11:58 AM

## 2021-02-09 NOTE — Assessment & Plan Note (Signed)
--  Secondary to acute illness.  Hemoglobin A1c 5.5.  Follow-up as an outpatient.  No treatment indicated.

## 2021-02-09 NOTE — Assessment & Plan Note (Signed)
--  Acute hepatitis panel negative, HIV nonreactive, ethanol level negative, Tylenol normal.  Reported history of fatty liver.  Differential includes ischemic hepatitis. --Markedly improved today.  Total bilirubin slightly elevated compared to yesterday.

## 2021-02-09 NOTE — Discharge Instructions (Signed)
Laparoscopic Cholecystectomy, Care After This sheet gives you information about how to care for yourself after your procedure. Your doctor may also give you more specific instructions. If you have problems or questions, contact your doctor. Follow these instructions at home: Care for cuts from surgery (incisions)   Follow instructions from your doctor about how to take care of your cuts from surgery. Make sure you: ? Wash your hands with soap and water before you change your bandage (dressing). If you cannot use soap and water, use hand sanitizer. ? Change your bandage as told by your doctor. ? Leave stitches (sutures), skin glue, or skin tape (adhesive) strips in place. They may need to stay in place for 2 weeks or longer. If tape strips get loose and curl up, you may trim the loose edges. Do not remove tape strips completely unless your doctor says it is okay.  Do not take baths, swim, or use a hot tub until your doctor says it is okay. OK TO SHOWER 24HRS AFTER YOUR SURGERY.   Check your surgical cut area every day for signs of infection. Check for: ? More redness, swelling, or pain. ? More fluid or blood. ? Warmth. ? Pus or a bad smell. Activity  Do not drive or use heavy machinery while taking prescription pain medicine.  Do not play contact sports until your doctor says it is okay.  Do not drive for 24 hours if you were given a medicine to help you relax (sedative).  Rest as needed. Do not return to work or school until your doctor says it is okay. General instructions .  tylenol and advil as needed for discomfort.  Please alternate between the two every four hours as needed for pain.   .  Use narcotics, if prescribed, only when tylenol and motrin is not enough to control pain. .  325-650mg every 8hrs to max of 3000mg/24hrs (including the 325mg in every norco dose) for the tylenol.   .  Advil up to 800mg per dose every 8hrs as needed for pain.    To prevent or treat constipation  while you are taking prescription pain medicine, your doctor may recommend that you: ? Drink enough fluid to keep your pee (urine) clear or pale yellow. ? Take over-the-counter or prescription medicines. ? Eat foods that are high in fiber, such as fresh fruits and vegetables, whole grains, and beans. ? Limit foods that are high in fat and processed sugars, such as fried and sweet foods. Contact a doctor if:  You develop a rash.  You have more redness, swelling, or pain around your surgical cuts.  You have more fluid or blood coming from your surgical cuts.  Your surgical cuts feel warm to the touch.  You have pus or a bad smell coming from your surgical cuts.  You have a fever.  One or more of your surgical cuts breaks open. Get help right away if:  You have trouble breathing.  You have chest pain.  You have pain that is getting worse in your shoulders.  You faint or feel dizzy when you stand.  You have very bad pain in your belly (abdomen).  You are sick to your stomach (nauseous) for more than one day.  You have throwing up (vomiting) that lasts for more than one day.  You have leg pain. This information is not intended to replace advice given to you by your health care provider. Make sure you discuss any questions you have with your   health care provider. Document Released: 01/23/2008 Document Revised: 11/04/2015 Document Reviewed: 10/02/2015 Elsevier Interactive Patient Education  2019 [employer]vier Inc.   

## 2021-02-12 LAB — SURGICAL PATHOLOGY

## 2021-02-13 ENCOUNTER — Telehealth: Payer: Self-pay

## 2021-02-13 LAB — CULTURE, BLOOD (ROUTINE X 2)
Culture: NO GROWTH
Culture: NO GROWTH
Special Requests: ADEQUATE
Special Requests: ADEQUATE

## 2021-02-13 NOTE — Telephone Encounter (Signed)
-----   Message from Virgel Manifold, MD sent at 02/09/2021  2:01 PM EDT ----- Please make clinic follow-up for elevated liver enzymes in the next few weeks

## 2021-02-15 NOTE — Telephone Encounter (Signed)
Appt scheduled for Nov 7 in [facility]

## 2021-03-05 ENCOUNTER — Ambulatory Visit: Payer: Medicare HMO | Admitting: Gastroenterology

## 2021-03-21 DIAGNOSIS — M4722 Other spondylosis with radiculopathy, cervical region: Secondary | ICD-10-CM | POA: Diagnosis not present

## 2021-03-21 DIAGNOSIS — M62830 Muscle spasm of back: Secondary | ICD-10-CM | POA: Diagnosis not present

## 2021-03-21 DIAGNOSIS — M961 Postlaminectomy syndrome, not elsewhere classified: Secondary | ICD-10-CM | POA: Diagnosis not present

## 2021-03-21 DIAGNOSIS — G894 Chronic pain syndrome: Secondary | ICD-10-CM | POA: Diagnosis not present

## 2021-03-21 DIAGNOSIS — Z79891 Long term (current) use of opiate analgesic: Secondary | ICD-10-CM | POA: Diagnosis not present

## 2021-04-17 ENCOUNTER — Other Ambulatory Visit: Payer: Self-pay | Admitting: Family Medicine

## 2021-04-17 DIAGNOSIS — F3131 Bipolar disorder, current episode depressed, mild: Secondary | ICD-10-CM

## 2021-04-22 ENCOUNTER — Other Ambulatory Visit: Payer: Self-pay | Admitting: Family Medicine

## 2021-04-22 DIAGNOSIS — K219 Gastro-esophageal reflux disease without esophagitis: Secondary | ICD-10-CM

## 2021-04-22 DIAGNOSIS — K2950 Unspecified chronic gastritis without bleeding: Secondary | ICD-10-CM

## 2021-04-22 DIAGNOSIS — F3131 Bipolar disorder, current episode depressed, mild: Secondary | ICD-10-CM
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
## 2021-04-27 ENCOUNTER — Encounter: Payer: Self-pay | Admitting: Family Medicine

## 2021-04-27 ENCOUNTER — Ambulatory Visit (INDEPENDENT_AMBULATORY_CARE_PROVIDER_SITE_OTHER): Payer: Medicare HMO | Admitting: Family Medicine

## 2021-04-27 VITALS — BP 118/68 | HR 76 | Temp 98.0°F | Resp 16 | Ht 63.0 in | Wt 138.0 lb

## 2021-04-27 DIAGNOSIS — K219 Gastro-esophageal reflux disease without esophagitis: Secondary | ICD-10-CM

## 2021-04-27 DIAGNOSIS — D649 Anemia, unspecified: Secondary | ICD-10-CM

## 2021-04-27 DIAGNOSIS — K5903 Drug induced constipation: Secondary | ICD-10-CM

## 2021-04-27 DIAGNOSIS — K2950 Unspecified chronic gastritis without bleeding: Secondary | ICD-10-CM | POA: Diagnosis not present

## 2021-04-27 DIAGNOSIS — G8929 Other chronic pain: Secondary | ICD-10-CM

## 2021-04-27 DIAGNOSIS — E785 Hyperlipidemia, unspecified: Secondary | ICD-10-CM

## 2021-04-27 DIAGNOSIS — T402X5A Adverse effect of other opioids, initial encounter: Secondary | ICD-10-CM

## 2021-04-27 DIAGNOSIS — F1021 Alcohol dependence, in remission: Secondary | ICD-10-CM

## 2021-04-27 DIAGNOSIS — M542 Cervicalgia: Secondary | ICD-10-CM

## 2021-04-27 DIAGNOSIS — D696 Thrombocytopenia, unspecified: Secondary | ICD-10-CM | POA: Diagnosis not present

## 2021-04-27 DIAGNOSIS — Z9049 Acquired absence of other specified parts of digestive tract: Secondary | ICD-10-CM | POA: Diagnosis not present

## 2021-04-27 DIAGNOSIS — E039 Hypothyroidism, unspecified: Secondary | ICD-10-CM | POA: Diagnosis not present

## 2021-04-27 DIAGNOSIS — F3131 Bipolar disorder, current episode depressed, mild: Secondary | ICD-10-CM

## 2021-04-27 DIAGNOSIS — Z981 Arthrodesis status: Secondary | ICD-10-CM | POA: Diagnosis not present

## 2021-04-27 DIAGNOSIS — E441 Mild protein-calorie malnutrition: Secondary | ICD-10-CM

## 2021-04-27 DIAGNOSIS — R69 Illness, unspecified: Secondary | ICD-10-CM | POA: Diagnosis not present

## 2021-04-27 DIAGNOSIS — F5104 Psychophysiologic insomnia: Secondary | ICD-10-CM

## 2021-04-27 DIAGNOSIS — J432 Centrilobular emphysema: Secondary | ICD-10-CM | POA: Diagnosis not present

## 2021-04-27 MED ORDER — QUETIAPINE FUMARATE 25 MG PO TABS: 25.0000 mg | ORAL_TABLET | Freq: Every day | ORAL | 0 refills | Status: DC

## 2021-04-27 MED ORDER — PANTOPRAZOLE SODIUM 40 MG PO TBEC: DELAYED_RELEASE_TABLET | ORAL | 0 refills | Status: DC

## 2021-04-27 MED ORDER — ESCITALOPRAM OXALATE 10 MG PO TABS: 10.0000 mg | ORAL_TABLET | Freq: Every day | ORAL | 1 refills | Status: DC

## 2021-04-28 LAB — COMPLETE METABOLIC PANEL WITH GFR
AG Ratio: 1.3 (calc) (ref 1.0–2.5)
ALT: 18 U/L (ref 6–29)
AST: 19 U/L (ref 10–35)
Albumin: 3.9 g/dL (ref 3.6–5.1)
Alkaline phosphatase (APISO): 87 U/L (ref 37–153)
BUN: 18 mg/dL (ref 7–25)
CO2: 29 mmol/L (ref 20–32)
Calcium: 9.2 mg/dL (ref 8.6–10.4)
Chloride: 105 mmol/L (ref 98–110)
Creat: 0.91 mg/dL (ref 0.50–1.03)
Globulin: 2.9 g/dL (calc) (ref 1.9–3.7)
Glucose, Bld: 71 mg/dL (ref 65–99)
Potassium: 4 mmol/L (ref 3.5–5.3)
Sodium: 141 mmol/L (ref 135–146)
Total Bilirubin: 0.3 mg/dL (ref 0.2–1.2)
Total Protein: 6.8 g/dL (ref 6.1–8.1)
eGFR: 73 mL/min/{1.73_m2} (ref >=60)

## 2021-04-28 LAB — TSH: TSH: 0.62 mIU/L (ref 0.40–4.50)

## 2021-04-28 LAB — CBC WITH DIFFERENTIAL/PLATELET
Absolute Monocytes: 320 cells/uL (ref 200–950)
Basophils Absolute: 19 cells/uL (ref 0–200)
Basophils Relative: 0.4 %
Eosinophils Absolute: 179 cells/uL (ref 15–500)
Eosinophils Relative: 3.8 %
HCT: 38.1 % (ref 35.0–45.0)
Hemoglobin: 12.6 g/dL (ref 11.7–15.5)
Lymphs Abs: 2101 cells/uL (ref 850–3900)
MCH: 29 pg (ref 27.0–33.0)
MCHC: 33.1 g/dL (ref 32.0–36.0)
MCV: 87.6 fL (ref 80.0–100.0)
MPV: 12.3 fL (ref 7.5–12.5)
Monocytes Relative: 6.8 %
Neutro Abs: 2082 cells/uL (ref 1500–7800)
Neutrophils Relative %: 44.3 %
Platelets: 200 10*3/uL (ref 140–400)
RBC: 4.35 10*6/uL (ref 3.80–5.10)
RDW: 12.6 % (ref 11.0–15.0)
Total Lymphocyte: 44.7 %
WBC: 4.7 10*3/uL (ref 3.8–10.8)

## 2021-04-28 LAB — IRON,TIBC AND FERRITIN PANEL
%SAT: 17 % (calc) (ref 16–45)
Ferritin: 38 ng/mL (ref 16–232)
Iron: 56 ug/dL (ref 45–160)
TIBC: 331 mcg/dL (calc) (ref 250–450)

## 2021-04-28 LAB — LIPID PANEL
Cholesterol: 181 mg/dL (ref <200)
HDL: 55 mg/dL (ref >=50)
LDL Cholesterol (Calc): 98 mg/dL (calc)
Non-HDL Cholesterol (Calc): 126 mg/dL (calc) (ref <130)
Total CHOL/HDL Ratio: 3.3 (calc) (ref <5.0)
Triglycerides: 184 mg/dL — ABNORMAL HIGH (ref <150)

## 2021-05-31 ENCOUNTER — Ambulatory Visit: Payer: Self-pay

## 2021-06-06 ENCOUNTER — Telehealth: Payer: Self-pay | Admitting: Family Medicine

## 2021-06-06 NOTE — Telephone Encounter (Signed)
Left message for patient to call back and schedule Medicare Annual Wellness Visit (AWV) in office.   If unable to come into the office for AWV,  please offer to do virtually or by telephone.  Last AWV: 05/21/2019  Please schedule at anytime with [place].  40 minute appointment  Any questions, please contact me at [phone]

## 2021-07-09 ENCOUNTER — Telehealth: Payer: Self-pay | Admitting: Family Medicine

## 2021-07-09 NOTE — Telephone Encounter (Signed)
Copied from [place] [id number]. Topic: Medicare AWV ?>> Jul 09, 2021  1:20 PM Cher Nakai R wrote: ?Reason for CRM:  ?No answer unable to leave a message for patient to call back and schedule Medicare Annual Wellness Visit (AWV) in office.  ? ?If unable to come into the office for AWV,  please offer to do virtually or by telephone. ? ?Last AWV: 05/21/2019 ? ?Please schedule at anytime with [place]. ? ?40 minute appointment ? ?Any questions, please contact me at [phone] ?

## 2021-07-20 ENCOUNTER — Other Ambulatory Visit: Payer: Self-pay | Admitting: Family Medicine

## 2021-07-20 DIAGNOSIS — F3131 Bipolar disorder, current episode depressed, mild: Secondary | ICD-10-CM

## 2021-07-23 ENCOUNTER — Other Ambulatory Visit: Payer: Self-pay | Admitting: Family Medicine

## 2021-07-23 DIAGNOSIS — F3131 Bipolar disorder, current episode depressed, mild: Secondary | ICD-10-CM

## 2021-07-23 DIAGNOSIS — F5104 Psychophysiologic insomnia: Secondary | ICD-10-CM

## 2021-07-24 ENCOUNTER — Other Ambulatory Visit: Payer: Self-pay | Admitting: Family Medicine

## 2021-07-24 DIAGNOSIS — E039 Hypothyroidism, unspecified: Secondary | ICD-10-CM

## 2021-07-25 ENCOUNTER — Other Ambulatory Visit: Payer: Self-pay

## 2021-07-25 DIAGNOSIS — E039 Hypothyroidism, unspecified: Secondary | ICD-10-CM

## 2021-07-30 ENCOUNTER — Other Ambulatory Visit: Payer: Self-pay

## 2021-07-30 DIAGNOSIS — E039 Hypothyroidism, unspecified: Secondary | ICD-10-CM

## 2021-08-01 ENCOUNTER — Other Ambulatory Visit: Payer: Self-pay | Admitting: Family Medicine

## 2021-08-01 DIAGNOSIS — E039 Hypothyroidism, unspecified: Secondary | ICD-10-CM

## 2021-08-23 ENCOUNTER — Ambulatory Visit: Payer: Medicare HMO

## 2021-10-18 ENCOUNTER — Other Ambulatory Visit: Payer: Self-pay | Admitting: Family Medicine

## 2021-10-18 DIAGNOSIS — F3131 Bipolar disorder, current episode depressed, mild: Secondary | ICD-10-CM

## 2021-10-18 DIAGNOSIS — F5104 Psychophysiologic insomnia: Secondary | ICD-10-CM

## 2021-10-18 DIAGNOSIS — K219 Gastro-esophageal reflux disease without esophagitis: Secondary | ICD-10-CM

## 2021-10-18 DIAGNOSIS — K2950 Unspecified chronic gastritis without bleeding: Secondary | ICD-10-CM

## 2021-10-21 ENCOUNTER — Other Ambulatory Visit: Payer: Self-pay | Admitting: Family Medicine

## 2021-10-21 DIAGNOSIS — E039 Hypothyroidism, unspecified: Secondary | ICD-10-CM

## 2021-10-21 DIAGNOSIS — F3131 Bipolar disorder, current episode depressed, mild: Secondary | ICD-10-CM
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
## 2021-10-24 ENCOUNTER — Ambulatory Visit (INDEPENDENT_AMBULATORY_CARE_PROVIDER_SITE_OTHER): Payer: Medicare Other | Admitting: Family Medicine

## 2021-10-24 ENCOUNTER — Encounter: Payer: Self-pay | Admitting: Family Medicine

## 2021-10-24 VITALS — BP 116/70 | HR 84 | Resp 16 | Ht 63.0 in | Wt 138.0 lb

## 2021-10-24 DIAGNOSIS — J432 Centrilobular emphysema: Secondary | ICD-10-CM

## 2021-10-24 DIAGNOSIS — Z1211 Encounter for screening for malignant neoplasm of colon: Secondary | ICD-10-CM

## 2021-10-24 DIAGNOSIS — K219 Gastro-esophageal reflux disease without esophagitis: Secondary | ICD-10-CM | POA: Diagnosis not present

## 2021-10-24 DIAGNOSIS — E039 Hypothyroidism, unspecified: Secondary | ICD-10-CM

## 2021-10-24 DIAGNOSIS — F1021 Alcohol dependence, in remission: Secondary | ICD-10-CM

## 2021-10-24 DIAGNOSIS — E785 Hyperlipidemia, unspecified: Secondary | ICD-10-CM

## 2021-10-24 DIAGNOSIS — F5104 Psychophysiologic insomnia: Secondary | ICD-10-CM

## 2021-10-24 DIAGNOSIS — F3131 Bipolar disorder, current episode depressed, mild: Secondary | ICD-10-CM | POA: Diagnosis not present

## 2021-10-24 DIAGNOSIS — Z8719 Personal history of other diseases of the digestive system: Secondary | ICD-10-CM

## 2021-10-24 DIAGNOSIS — Z1231 Encounter for screening mammogram for malignant neoplasm of breast: Secondary | ICD-10-CM

## 2021-10-24 DIAGNOSIS — Z23 Encounter for immunization: Secondary | ICD-10-CM

## 2021-10-24 MED ORDER — PANTOPRAZOLE SODIUM 20 MG PO TBEC: 20.0000 mg | DELAYED_RELEASE_TABLET | Freq: Every day | ORAL | 0 refills | Status: DC

## 2021-10-24 MED ORDER — ESCITALOPRAM OXALATE 10 MG PO TABS: 10.0000 mg | ORAL_TABLET | Freq: Every day | ORAL | 1 refills | Status: DC

## 2021-10-24 MED ORDER — LEVOTHYROXINE SODIUM 88 MCG PO TABS: 88.0000 ug | ORAL_TABLET | Freq: Every day | ORAL | 1 refills | Status: DC

## 2021-10-24 MED ORDER — SHINGRIX 50 MCG/0.5ML IM SUSR: 0.5000 mL | Freq: Once | INTRAMUSCULAR | 0 refills | Status: AC

## 2021-10-24 MED ORDER — QUETIAPINE FUMARATE 25 MG PO TABS: 25.0000 mg | ORAL_TABLET | Freq: Every day | ORAL | 1 refills | Status: DC

## 2021-10-24 MED ORDER — TETANUS-DIPHTH-ACELL PERTUSSIS 5-2-15.5 LF-MCG/0.5 IM SUSP: 0.5000 mL | Freq: Once | INTRAMUSCULAR | 0 refills | Status: AC

## 2021-10-24 NOTE — Assessment & Plan Note (Signed)
Low ASCVD, not on medication

## 2021-10-24 NOTE — Assessment & Plan Note (Signed)
Continue current dose and recheck labs yearly

## 2021-10-24 NOTE — Assessment & Plan Note (Signed)
Quit in 2021

## 2021-10-24 NOTE — Assessment & Plan Note (Signed)
Quit smoking in 2019, no symptoms and not on medication Does not want to have repeat lung cancer screening at this time

## 2021-11-15 LAB — COLOGUARD: COLOGUARD: NEGATIVE

## 2022-01-18 ENCOUNTER — Other Ambulatory Visit: Payer: Self-pay | Admitting: Family Medicine

## 2022-01-18 DIAGNOSIS — K219 Gastro-esophageal reflux disease without esophagitis: Secondary | ICD-10-CM

## 2022-01-18 DIAGNOSIS — Z8719 Personal history of other diseases of the digestive system: Secondary | ICD-10-CM
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
## 2022-02-28 ENCOUNTER — Encounter: Payer: Self-pay | Admitting: Family Medicine

## 2022-04-15 ENCOUNTER — Other Ambulatory Visit: Payer: Self-pay | Admitting: Family Medicine

## 2022-04-15 DIAGNOSIS — K219 Gastro-esophageal reflux disease without esophagitis: Secondary | ICD-10-CM

## 2022-04-15 DIAGNOSIS — Z8719 Personal history of other diseases of the digestive system: Secondary | ICD-10-CM

## 2022-04-18 ENCOUNTER — Other Ambulatory Visit: Payer: Self-pay | Admitting: Family Medicine

## 2022-04-18 DIAGNOSIS — E039 Hypothyroidism, unspecified: Secondary | ICD-10-CM

## 2022-04-18 DIAGNOSIS — F5104 Psychophysiologic insomnia: Secondary | ICD-10-CM

## 2022-04-18 DIAGNOSIS — F3131 Bipolar disorder, current episode depressed, mild: Secondary | ICD-10-CM
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
## 2022-04-26 ENCOUNTER — Ambulatory Visit: Payer: Self-pay | Admitting: Family Medicine
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
## 2022-05-01 ENCOUNTER — Ambulatory Visit (INDEPENDENT_AMBULATORY_CARE_PROVIDER_SITE_OTHER): Payer: Medicare Other | Admitting: Family Medicine

## 2022-05-01 ENCOUNTER — Encounter: Payer: Self-pay | Admitting: Family Medicine

## 2022-05-01 VITALS — BP 118/68 | HR 84 | Resp 16 | Ht 63.0 in | Wt 142.0 lb

## 2022-05-01 DIAGNOSIS — Z1231 Encounter for screening mammogram for malignant neoplasm of breast: Secondary | ICD-10-CM

## 2022-05-01 DIAGNOSIS — K219 Gastro-esophageal reflux disease without esophagitis: Secondary | ICD-10-CM | POA: Diagnosis not present

## 2022-05-01 DIAGNOSIS — Z79899 Other long term (current) drug therapy: Secondary | ICD-10-CM

## 2022-05-01 DIAGNOSIS — Z87891 Personal history of nicotine dependence: Secondary | ICD-10-CM

## 2022-05-01 DIAGNOSIS — F3131 Bipolar disorder, current episode depressed, mild: Secondary | ICD-10-CM | POA: Diagnosis not present

## 2022-05-01 DIAGNOSIS — J432 Centrilobular emphysema: Secondary | ICD-10-CM

## 2022-05-01 DIAGNOSIS — E039 Hypothyroidism, unspecified: Secondary | ICD-10-CM | POA: Diagnosis not present

## 2022-05-01 DIAGNOSIS — F5104 Psychophysiologic insomnia: Secondary | ICD-10-CM

## 2022-05-01 DIAGNOSIS — F1021 Alcohol dependence, in remission: Secondary | ICD-10-CM

## 2022-05-01 DIAGNOSIS — E785 Hyperlipidemia, unspecified: Secondary | ICD-10-CM | POA: Diagnosis not present

## 2022-05-01 DIAGNOSIS — Z8719 Personal history of other diseases of the digestive system: Secondary | ICD-10-CM | POA: Diagnosis not present

## 2022-05-01 MED ORDER — ESCITALOPRAM OXALATE 10 MG PO TABS: 10.0000 mg | ORAL_TABLET | Freq: Every day | ORAL | 1 refills | Status: DC

## 2022-05-01 MED ORDER — PANTOPRAZOLE SODIUM 20 MG PO TBEC: 20.0000 mg | DELAYED_RELEASE_TABLET | Freq: Every day | ORAL | 1 refills | Status: DC

## 2022-05-01 MED ORDER — QUETIAPINE FUMARATE 25 MG PO TABS: 25.0000 mg | ORAL_TABLET | Freq: Every day | ORAL | 1 refills | Status: DC

## 2022-05-01 NOTE — Patient Instructions (Signed)
You need COVID, RSV, flu and Tdap

## 2022-05-02 ENCOUNTER — Other Ambulatory Visit: Payer: Self-pay | Admitting: Family Medicine

## 2022-05-02 DIAGNOSIS — E039 Hypothyroidism, unspecified: Secondary | ICD-10-CM

## 2022-05-02 LAB — COMPLETE METABOLIC PANEL WITH GFR
AG Ratio: 1.4 (calc) (ref 1.0–2.5)
ALT: 14 U/L (ref 6–29)
AST: 15 U/L (ref 10–35)
Albumin: 4.3 g/dL (ref 3.6–5.1)
Alkaline phosphatase (APISO): 88 U/L (ref 37–153)
BUN: 24 mg/dL (ref 7–25)
CO2: 28 mmol/L (ref 20–32)
Calcium: 9.2 mg/dL (ref 8.6–10.4)
Chloride: 105 mmol/L (ref 98–110)
Creat: 0.97 mg/dL (ref 0.50–1.05)
Globulin: 3 g/dL (calc) (ref 1.9–3.7)
Glucose, Bld: 84 mg/dL (ref 65–99)
Potassium: 4.1 mmol/L (ref 3.5–5.3)
Sodium: 140 mmol/L (ref 135–146)
Total Bilirubin: 0.7 mg/dL (ref 0.2–1.2)
Total Protein: 7.3 g/dL (ref 6.1–8.1)
eGFR: 67 mL/min/{1.73_m2} (ref >=60)

## 2022-05-02 LAB — TSH: TSH: 0.35 mIU/L — ABNORMAL LOW (ref 0.40–4.50)

## 2022-05-02 MED ORDER — LEVOTHYROXINE SODIUM 88 MCG PO TABS: 88.0000 ug | ORAL_TABLET | Freq: Every day | ORAL | 0 refills | Status: DC

## 2022-05-06 NOTE — Patient Instructions (Signed)
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
## 2022-05-07 ENCOUNTER — Other Ambulatory Visit (HOSPITAL_COMMUNITY)
Admission: RE | Admit: 2022-05-07 | Discharge: 2022-05-07 | Disposition: A | Discharge status: Home / Self Care | Payer: Medicare Other | Source: Ambulatory Visit | Attending: Family Medicine | Admitting: Family Medicine

## 2022-05-07 ENCOUNTER — Encounter: Payer: Self-pay | Admitting: Family Medicine

## 2022-05-07 ENCOUNTER — Ambulatory Visit (INDEPENDENT_AMBULATORY_CARE_PROVIDER_SITE_OTHER): Payer: Medicare Other | Admitting: Family Medicine

## 2022-05-07 VITALS — BP 122/76 | HR 63 | Resp 16 | Ht 63.0 in | Wt 142.0 lb

## 2022-05-07 DIAGNOSIS — Z124 Encounter for screening for malignant neoplasm of cervix: Secondary | ICD-10-CM

## 2022-05-07 DIAGNOSIS — D229 Melanocytic nevi, unspecified: Secondary | ICD-10-CM

## 2022-05-07 DIAGNOSIS — Z0000 Encounter for general adult medical examination without abnormal findings: Secondary | ICD-10-CM | POA: Diagnosis not present

## 2022-05-07 DIAGNOSIS — Z1151 Encounter for screening for human papillomavirus (HPV): Secondary | ICD-10-CM | POA: Diagnosis not present

## 2022-05-07 DIAGNOSIS — Z01419 Encounter for gynecological examination (general) (routine) without abnormal findings: Secondary | ICD-10-CM | POA: Diagnosis present

## 2022-05-09 LAB — CYTOLOGY - PAP
Comment: NEGATIVE
Diagnosis: NEGATIVE
High risk HPV: NEGATIVE

## 2022-05-20 ENCOUNTER — Other Ambulatory Visit: Payer: Self-pay | Admitting: Family Medicine

## 2022-05-20 DIAGNOSIS — E039 Hypothyroidism, unspecified: Secondary | ICD-10-CM

## 2022-05-22 ENCOUNTER — Other Ambulatory Visit: Payer: Self-pay | Admitting: Family Medicine

## 2022-05-22 DIAGNOSIS — F3131 Bipolar disorder, current episode depressed, mild: Secondary | ICD-10-CM

## 2022-06-06 DIAGNOSIS — G894 Chronic pain syndrome: Secondary | ICD-10-CM | POA: Diagnosis not present

## 2022-06-06 DIAGNOSIS — M62830 Muscle spasm of back: Secondary | ICD-10-CM | POA: Diagnosis not present

## 2022-06-06 DIAGNOSIS — M961 Postlaminectomy syndrome, not elsewhere classified: Secondary | ICD-10-CM | POA: Diagnosis not present

## 2022-06-06 DIAGNOSIS — M4722 Other spondylosis with radiculopathy, cervical region: Secondary | ICD-10-CM | POA: Diagnosis not present

## 2022-06-07 ENCOUNTER — Ambulatory Visit (INDEPENDENT_AMBULATORY_CARE_PROVIDER_SITE_OTHER): Payer: Medicare Other

## 2022-06-07 VITALS — Ht 63.0 in | Wt 142.0 lb

## 2022-06-07 DIAGNOSIS — Z0000 Encounter for general adult medical examination without abnormal findings: Secondary | ICD-10-CM

## 2022-06-07 NOTE — Patient Instructions (Signed)
Ms. Anna Giles , Thank you for taking time to come for your Medicare Wellness Visit. I appreciate your ongoing commitment to your health goals. Please review the following plan we discussed and let me know if I can assist you in the future.   These are the goals we discussed:  Goals   None     This is a list of the screening recommended for you and due dates:  Health Maintenance  Topic Date Due   DTaP/Tdap/Td vaccine (1 - Tdap) Never done   Mammogram  06/21/2010   COVID-19 Vaccine (5 - 2023-24 season) 12/28/2021   Zoster (Shingles) Vaccine (1 of 2) 07/25/2022*   Flu Shot  07/28/2022*   Screening for Lung Cancer  05/02/2023*   Medicare Annual Wellness Visit  06/08/2023   Cologuard (Stool DNA test)  11/08/2024   Pap Smear  05/07/2025   Hepatitis C Screening: USPSTF Recommendation to screen - Ages 18-79 yo.  Completed   HIV Screening  Completed   HPV Vaccine  Aged Out   Colon Cancer Screening  Discontinued  *Topic was postponed. The date shown is not the original due date.    Advanced directives: no mailed paperwork  Conditions/risks identified: low fall risk  Next appointment: Follow up in one year for your annual wellness visit. 06/12/2023@ 1:30pm/telephone  Preventive Care 40-64 Years, Female Preventive care refers to lifestyle choices and visits with your health care provider that can promote health and wellness. What does preventive care include? A yearly physical exam. This is also called an annual well check. Dental exams once or twice a year. Routine eye exams. Ask your health care provider how often you should have your eyes checked. Personal lifestyle choices, including: Daily care of your teeth and gums. Regular physical activity. Eating a healthy diet. Avoiding tobacco and drug use. Limiting alcohol use. Practicing safe sex. Taking low-dose aspirin daily starting at age 57. Taking vitamin and mineral supplements as recommended by your health care provider. What  happens during an annual well check? The services and screenings done by your health care provider during your annual well check will depend on your age, overall health, lifestyle risk factors, and family history of disease. Counseling  Your health care provider may ask you questions about your: Alcohol use. Tobacco use. Drug use. Emotional well-being. Home and relationship well-being. Sexual activity. Eating habits. Work and work Statistician. Method of birth control. Menstrual cycle. Pregnancy history. Screening  You may have the following tests or measurements: Height, weight, and BMI. Blood pressure. Lipid and cholesterol levels. These may be checked every 5 years, or more frequently if you are over 54 years old. Skin check. Lung cancer screening. You may have this screening every year starting at age 67 if you have a 30-pack-year history of smoking and currently smoke or have quit within the past 15 years. Fecal occult blood test (FOBT) of the stool. You may have this test every year starting at age 54. Flexible sigmoidoscopy or colonoscopy. You may have a sigmoidoscopy every 5 years or a colonoscopy every 10 years starting at age 37. Hepatitis C blood test. Hepatitis B blood test. Sexually transmitted disease (STD) testing. Diabetes screening. This is done by checking your blood sugar (glucose) after you have not eaten for a while (fasting). You may have this done every 1-3 years. Mammogram. This may be done every 1-2 years. Talk to your health care provider about when you should start having regular mammograms. This may depend on whether you have  a family history of breast cancer. BRCA-related cancer screening. This may be done if you have a family history of breast, ovarian, tubal, or peritoneal cancers. Pelvic exam and Pap test. This may be done every 3 years starting at age 76. Starting at age 41, this may be done every 5 years if you have a Pap test in combination with an HPV  test. Bone density scan. This is done to screen for osteoporosis. You may have this scan if you are at high risk for osteoporosis. Discuss your test results, treatment options, and if necessary, the need for more tests with your health care provider. Vaccines  Your health care provider may recommend certain vaccines, such as: Influenza vaccine. This is recommended every year. Tetanus, diphtheria, and acellular pertussis (Tdap, Td) vaccine. You may need a Td booster every 10 years. Zoster vaccine. You may need this after age 42. Pneumococcal 13-valent conjugate (PCV13) vaccine. You may need this if you have certain conditions and were not previously vaccinated. Pneumococcal polysaccharide (PPSV23) vaccine. You may need one or two doses if you smoke cigarettes or if you have certain conditions. Talk to your health care provider about which screenings and vaccines you need and how often you need them. This information is not intended to replace advice given to you by your health care provider. Make sure you discuss any questions you have with your health care provider. Document Released: 05/12/2015 Document Revised: 01/03/2016 Document Reviewed: 02/14/2015 Elsevier Interactive Patient Education  2017 [place] Prevention in the Home Falls can cause injuries. They can happen to people of all ages. There are many things you can do to make your home safe and to help prevent falls. What can I do on the outside of my home? Regularly fix the edges of walkways and driveways and fix any cracks. Remove anything that might make you trip as you walk through a door, such as a raised step or threshold. Trim any bushes or trees on the path to your home. Use bright outdoor lighting. Clear any walking paths of anything that might make someone trip, such as rocks or tools. Regularly check to see if handrails are loose or broken. Make sure that both sides of any steps have handrails. Any raised  decks and porches should have guardrails on the edges. Have any leaves, snow, or ice cleared regularly. Use sand or salt on walking paths during winter. Clean up any spills in your garage right away. This includes oil or grease spills. What can I do in the bathroom? Use night lights. Install grab bars by the toilet and in the tub and shower. Do not use towel bars as grab bars. Use non-skid mats or decals in the tub or shower. If you need to sit down in the shower, use a plastic, non-slip stool. Keep the floor dry. Clean up any water that spills on the floor as soon as it happens. Remove soap buildup in the tub or shower regularly. Attach bath mats securely with double-sided non-slip rug tape. Do not have throw rugs and other things on the floor that can make you trip. What can I do in the bedroom? Use night lights. Make sure that you have a light by your bed that is easy to reach. Do not use any sheets or blankets that are too big for your bed. They should not hang down onto the floor. Have a firm chair that has side arms. You can use this for support while  you get dressed. Do not have throw rugs and other things on the floor that can make you trip. What can I do in the kitchen? Clean up any spills right away. Avoid walking on wet floors. Keep items that you use a lot in easy-to-reach places. If you need to reach something above you, use a strong step stool that has a grab bar. Keep electrical cords out of the way. Do not use floor polish or wax that makes floors slippery. If you must use wax, use non-skid floor wax. Do not have throw rugs and other things on the floor that can make you trip. What can I do with my stairs? Do not leave any items on the stairs. Make sure that there are handrails on both sides of the stairs and use them. Fix handrails that are broken or loose. Make sure that handrails are as long as the stairways. Check any carpeting to make sure that it is firmly attached  to the stairs. Fix any carpet that is loose or worn. Avoid having throw rugs at the top or bottom of the stairs. If you do have throw rugs, attach them to the floor with carpet tape. Make sure that you have a light switch at the top of the stairs and the bottom of the stairs. If you do not have them, ask someone to add them for you. What else can I do to help prevent falls? Wear shoes that: Do not have high heels. Have rubber bottoms. Are comfortable and fit you well. Are closed at the toe. Do not wear sandals. If you use a stepladder: Make sure that it is fully opened. Do not climb a closed stepladder. Make sure that both sides of the stepladder are locked into place. Ask someone to hold it for you, if possible. Clearly mark and make sure that you can see: Any grab bars or handrails. First and last steps. Where the edge of each step is. Use tools that help you move around (mobility aids) if they are needed. These include: Canes. Walkers. Scooters. Crutches. Turn on the lights when you go into a dark area. Replace any light bulbs as soon as they burn out. Set up your furniture so you have a clear path. Avoid moving your furniture around. If any of your floors are uneven, fix them. If there are any pets around you, be aware of where they are. Review your medicines with your doctor. Some medicines can make you feel dizzy. This can increase your chance of falling. Ask your doctor what other things that you can do to help prevent falls. This information is not intended to replace advice given to you by your health care provider. Make sure you discuss any questions you have with your health care provider. Document Released: 02/09/2009 Document Revised: 09/21/2015 Document Reviewed: 05/20/2014 Elsevier Interactive Patient Education  2017 [employer].
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
## 2022-08-01 DIAGNOSIS — M62830 Muscle spasm of back: Secondary | ICD-10-CM | POA: Diagnosis not present

## 2022-08-01 DIAGNOSIS — M4722 Other spondylosis with radiculopathy, cervical region: Secondary | ICD-10-CM | POA: Diagnosis not present

## 2022-08-01 DIAGNOSIS — M961 Postlaminectomy syndrome, not elsewhere classified: Secondary | ICD-10-CM | POA: Diagnosis not present

## 2022-08-01 DIAGNOSIS — G894 Chronic pain syndrome: Secondary | ICD-10-CM | POA: Diagnosis not present

## 2022-08-01 DIAGNOSIS — Z79891 Long term (current) use of opiate analgesic: Secondary | ICD-10-CM | POA: Diagnosis not present

## 2022-09-02 ENCOUNTER — Other Ambulatory Visit: Payer: Self-pay | Admitting: Family Medicine

## 2022-09-02 DIAGNOSIS — E039 Hypothyroidism, unspecified: Secondary | ICD-10-CM

## 2022-09-04 NOTE — Telephone Encounter (Signed)
Spoke with pt and she will stop by to do lab work

## 2022-09-09 ENCOUNTER — Other Ambulatory Visit: Payer: Self-pay | Admitting: Family Medicine

## 2022-09-09 DIAGNOSIS — E039 Hypothyroidism, unspecified: Secondary | ICD-10-CM

## 2022-09-09 NOTE — Telephone Encounter (Signed)
Duplicate

## 2022-09-20 DIAGNOSIS — E039 Hypothyroidism, unspecified: Secondary | ICD-10-CM | POA: Diagnosis not present

## 2022-09-21 ENCOUNTER — Other Ambulatory Visit: Payer: Self-pay | Admitting: Family Medicine

## 2022-09-21 DIAGNOSIS — E039 Hypothyroidism, unspecified: Secondary | ICD-10-CM

## 2022-09-21 LAB — TSH: TSH: 0.77 mIU/L (ref 0.40–4.50)

## 2022-09-21 MED ORDER — LEVOTHYROXINE SODIUM 88 MCG PO TABS: ORAL_TABLET | ORAL | 0 refills | Status: DC

## 2022-10-01 DIAGNOSIS — M62830 Muscle spasm of back: Secondary | ICD-10-CM | POA: Diagnosis not present

## 2022-10-01 DIAGNOSIS — M4722 Other spondylosis with radiculopathy, cervical region: Secondary | ICD-10-CM | POA: Diagnosis not present

## 2022-10-01 DIAGNOSIS — M961 Postlaminectomy syndrome, not elsewhere classified: Secondary | ICD-10-CM | POA: Diagnosis not present

## 2022-10-01 DIAGNOSIS — G894 Chronic pain syndrome: Secondary | ICD-10-CM | POA: Diagnosis not present

## 2022-11-20 ENCOUNTER — Other Ambulatory Visit: Payer: Self-pay | Admitting: Family Medicine

## 2022-11-20 DIAGNOSIS — Z8719 Personal history of other diseases of the digestive system: Secondary | ICD-10-CM

## 2022-11-20 DIAGNOSIS — F3131 Bipolar disorder, current episode depressed, mild: Secondary | ICD-10-CM

## 2022-11-20 DIAGNOSIS — K219 Gastro-esophageal reflux disease without esophagitis: Secondary | ICD-10-CM

## 2022-11-20 DIAGNOSIS — F5104 Psychophysiologic insomnia: Secondary | ICD-10-CM

## 2022-11-27 ENCOUNTER — Other Ambulatory Visit: Payer: Self-pay | Admitting: Family Medicine

## 2022-11-27 DIAGNOSIS — F3131 Bipolar disorder, current episode depressed, mild: Secondary | ICD-10-CM

## 2022-11-28 ENCOUNTER — Other Ambulatory Visit: Payer: Self-pay

## 2022-11-28 DIAGNOSIS — F3131 Bipolar disorder, current episode depressed, mild: Secondary | ICD-10-CM

## 2022-12-02 ENCOUNTER — Other Ambulatory Visit: Payer: Self-pay | Admitting: Family Medicine

## 2022-12-02 DIAGNOSIS — F3131 Bipolar disorder, current episode depressed, mild: Secondary | ICD-10-CM

## 2022-12-02 NOTE — Telephone Encounter (Signed)
Medication Refill - Medication:  escitalopram (LEXAPRO) 10 MG tablet  Has the patient contacted their pharmacy? Yes.   (Agent: If no, request that the patient contact the pharmacy for the refill. If patient does not wish to contact the pharmacy document the reason why and proceed with request.) (Agent: If yes, when and what did the pharmacy advise?)  Preferred Pharmacy (with phone number or street name): [facility] [id number] [place], [place] - [address]  Has the patient been seen for an appointment in the last year OR does the patient have an upcoming appointment? Yes.    Pt is completely out of this medication, and hopes to get enough to get her through to appt.  Rx was denied and she thinks it is b/c she needs appt..  pt made appt

## 2022-12-03 NOTE — Telephone Encounter (Signed)
Requested medication (s) are due for refill today: Yes  Requested medication (s) are on the active medication list: Yes  Last refill:  05/01/22  Future visit scheduled: Yes  Notes to clinic:  Unable to refill per protocol, appointment needed. Appointment scheduled 12/09/22     Requested Prescriptions  Pending Prescriptions Disp Refills   escitalopram (LEXAPRO) 10 MG tablet 90 tablet 1    Sig: Take 1 tablet (10 mg total) by mouth at bedtime.     Psychiatry:  Antidepressants - SSRI Passed - 12/02/2022 10:39 AM      Passed - Completed PHQ-2 or PHQ-9 in the last 360 days      Passed - Valid encounter within last 6 months    Recent Outpatient Visits           7 months ago Well adult exam   [facility] Alba Cory, MD   7 months ago Centrilobular emphysema [facility])   [facility] Alba Cory, MD   1 year ago Bipolar affective disorder, currently depressed, mild [facility])   [facility] Alba Cory, MD   1 year ago Bipolar affective disorder, currently depressed, mild [facility])   [facility] Alba Cory, MD   2 years ago Facial cellulitis   [facility] Alba Cory, MD       Future Appointments             In 6 days Alba Cory, MD [facility]

## 2022-12-04 NOTE — Telephone Encounter (Unsigned)
Copied from CRM [id number]. Topic: General - Other >> Dec 04, 2022 10:46 AM Clide Dales wrote: Patient is currently out of her escitalopram (LEXAPRO) 10 MG tablet and needs a refill. Refill requests are being denied because the patient needs an appointment. Patient is scheduled to se PCP on 8/12. Can enough medication be sent in to last until her appointment? Please advise. [facility] [id number] [place], [place] - [address]  Phone: [phone] Fax: [phone]

## 2022-12-05 DIAGNOSIS — M62830 Muscle spasm of back: Secondary | ICD-10-CM | POA: Diagnosis not present

## 2022-12-05 DIAGNOSIS — M4722 Other spondylosis with radiculopathy, cervical region: Secondary | ICD-10-CM | POA: Diagnosis not present

## 2022-12-05 DIAGNOSIS — M961 Postlaminectomy syndrome, not elsewhere classified: Secondary | ICD-10-CM | POA: Diagnosis not present

## 2022-12-05 DIAGNOSIS — G894 Chronic pain syndrome: Secondary | ICD-10-CM | POA: Diagnosis not present

## 2022-12-06 ENCOUNTER — Other Ambulatory Visit: Payer: Self-pay

## 2022-12-06 DIAGNOSIS — F3131 Bipolar disorder, current episode depressed, mild: Secondary | ICD-10-CM

## 2022-12-06 MED ORDER — ESCITALOPRAM OXALATE 10 MG PO TABS: 10.0000 mg | ORAL_TABLET | Freq: Every day | ORAL | 0 refills | Status: DC
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
## 2022-12-09 ENCOUNTER — Ambulatory Visit (INDEPENDENT_AMBULATORY_CARE_PROVIDER_SITE_OTHER): Payer: Medicare Other | Admitting: Family Medicine

## 2022-12-09 ENCOUNTER — Ambulatory Visit: Payer: Medicare Other | Admitting: Family Medicine

## 2022-12-09 ENCOUNTER — Encounter: Payer: Self-pay | Admitting: Family Medicine

## 2022-12-09 VITALS — BP 112/68 | HR 83 | Resp 16 | Ht 63.0 in | Wt 142.0 lb

## 2022-12-09 DIAGNOSIS — E785 Hyperlipidemia, unspecified: Secondary | ICD-10-CM | POA: Diagnosis not present

## 2022-12-09 DIAGNOSIS — K219 Gastro-esophageal reflux disease without esophagitis: Secondary | ICD-10-CM

## 2022-12-09 DIAGNOSIS — E039 Hypothyroidism, unspecified: Secondary | ICD-10-CM

## 2022-12-09 DIAGNOSIS — M542 Cervicalgia: Secondary | ICD-10-CM

## 2022-12-09 DIAGNOSIS — F5104 Psychophysiologic insomnia: Secondary | ICD-10-CM

## 2022-12-09 DIAGNOSIS — J432 Centrilobular emphysema: Secondary | ICD-10-CM | POA: Diagnosis not present

## 2022-12-09 DIAGNOSIS — Z8719 Personal history of other diseases of the digestive system: Secondary | ICD-10-CM | POA: Diagnosis not present

## 2022-12-09 DIAGNOSIS — F1021 Alcohol dependence, in remission: Secondary | ICD-10-CM

## 2022-12-09 DIAGNOSIS — F3131 Bipolar disorder, current episode depressed, mild: Secondary | ICD-10-CM | POA: Diagnosis not present

## 2022-12-09 DIAGNOSIS — G8929 Other chronic pain: Secondary | ICD-10-CM

## 2022-12-09 MED ORDER — LEVOTHYROXINE SODIUM 88 MCG PO TABS
ORAL_TABLET | ORAL | 1 refills | Status: DC
Start: 2022-12-09 — End: 2023-06-05

## 2022-12-09 MED ORDER — PANTOPRAZOLE SODIUM 20 MG PO TBEC
20.0000 mg | DELAYED_RELEASE_TABLET | Freq: Every day | ORAL | 1 refills | Status: DC
Start: 2022-12-09 — End: 2023-06-05

## 2022-12-09 MED ORDER — ESCITALOPRAM OXALATE 10 MG PO TABS
10.0000 mg | ORAL_TABLET | Freq: Every day | ORAL | 1 refills | Status: DC
Start: 2022-12-09 — End: 2023-01-07

## 2022-12-09 MED ORDER — QUETIAPINE FUMARATE 25 MG PO TABS
25.0000 mg | ORAL_TABLET | Freq: Every day | ORAL | 1 refills | Status: DC
Start: 2022-12-09 — End: 2023-06-05

## 2022-12-19 ENCOUNTER — Ambulatory Visit
Admission: RE | Admit: 2022-12-19 | Discharge: 2022-12-19 | Disposition: A | Discharge status: Home / Self Care | Payer: Medicare Other | Source: Ambulatory Visit | Attending: Family Medicine | Admitting: Family Medicine

## 2022-12-19 DIAGNOSIS — Z1231 Encounter for screening mammogram for malignant neoplasm of breast: Secondary | ICD-10-CM | POA: Insufficient documentation

## 2022-12-20 ENCOUNTER — Other Ambulatory Visit: Payer: Self-pay

## 2022-12-20 ENCOUNTER — Other Ambulatory Visit: Payer: Self-pay | Admitting: Family Medicine

## 2022-12-20 DIAGNOSIS — R928 Other abnormal and inconclusive findings on diagnostic imaging of breast: Secondary | ICD-10-CM

## 2022-12-20 NOTE — Progress Notes (Signed)
ordered

## 2022-12-23 ENCOUNTER — Other Ambulatory Visit: Payer: Self-pay | Admitting: Family Medicine

## 2022-12-23 DIAGNOSIS — E039 Hypothyroidism, unspecified: Secondary | ICD-10-CM

## 2022-12-27 ENCOUNTER — Other Ambulatory Visit: Payer: Self-pay | Admitting: Family Medicine

## 2022-12-27 DIAGNOSIS — E039 Hypothyroidism, unspecified: Secondary | ICD-10-CM

## 2023-01-06 ENCOUNTER — Other Ambulatory Visit: Payer: Self-pay | Admitting: Family Medicine

## 2023-01-06 DIAGNOSIS — F3131 Bipolar disorder, current episode depressed, mild: Secondary | ICD-10-CM

## 2023-01-30 DIAGNOSIS — M62830 Muscle spasm of back: Secondary | ICD-10-CM | POA: Diagnosis not present

## 2023-01-30 DIAGNOSIS — M4722 Other spondylosis with radiculopathy, cervical region: Secondary | ICD-10-CM | POA: Diagnosis not present

## 2023-01-30 DIAGNOSIS — G894 Chronic pain syndrome: Secondary | ICD-10-CM | POA: Diagnosis not present

## 2023-01-30 DIAGNOSIS — Z79891 Long term (current) use of opiate analgesic: Secondary | ICD-10-CM | POA: Diagnosis not present

## 2023-01-30 DIAGNOSIS — M961 Postlaminectomy syndrome, not elsewhere classified: Secondary | ICD-10-CM | POA: Diagnosis not present

## 2023-04-03 DIAGNOSIS — G894 Chronic pain syndrome: Secondary | ICD-10-CM | POA: Diagnosis not present

## 2023-04-03 DIAGNOSIS — M961 Postlaminectomy syndrome, not elsewhere classified: Secondary | ICD-10-CM | POA: Diagnosis not present

## 2023-04-03 DIAGNOSIS — M62830 Muscle spasm of back: Secondary | ICD-10-CM | POA: Diagnosis not present

## 2023-04-03 DIAGNOSIS — M4722 Other spondylosis with radiculopathy, cervical region: Secondary | ICD-10-CM | POA: Diagnosis not present

## 2023-05-16 ENCOUNTER — Other Ambulatory Visit: Payer: Self-pay

## 2023-06-02 ENCOUNTER — Other Ambulatory Visit: Payer: Self-pay

## 2023-06-02 DIAGNOSIS — F3131 Bipolar disorder, current episode depressed, mild: Secondary | ICD-10-CM

## 2023-06-02 DIAGNOSIS — K219 Gastro-esophageal reflux disease without esophagitis: Secondary | ICD-10-CM

## 2023-06-02 DIAGNOSIS — Z8719 Personal history of other diseases of the digestive system: Secondary | ICD-10-CM

## 2023-06-02 DIAGNOSIS — F5104 Psychophysiologic insomnia: Secondary | ICD-10-CM

## 2023-06-02 NOTE — Telephone Encounter (Signed)
She is coming in Friday.

## 2023-06-03 DIAGNOSIS — M4722 Other spondylosis with radiculopathy, cervical region: Secondary | ICD-10-CM | POA: Diagnosis not present

## 2023-06-03 DIAGNOSIS — G894 Chronic pain syndrome: Secondary | ICD-10-CM | POA: Diagnosis not present

## 2023-06-03 DIAGNOSIS — M961 Postlaminectomy syndrome, not elsewhere classified: Secondary | ICD-10-CM | POA: Diagnosis not present

## 2023-06-03 DIAGNOSIS — M62830 Muscle spasm of back: Secondary | ICD-10-CM | POA: Diagnosis not present

## 2023-06-05 ENCOUNTER — Ambulatory Visit (INDEPENDENT_AMBULATORY_CARE_PROVIDER_SITE_OTHER): Payer: Medicare Other | Admitting: Family Medicine

## 2023-06-05 ENCOUNTER — Encounter: Payer: Self-pay | Admitting: Family Medicine

## 2023-06-05 VITALS — BP 126/80 | HR 72 | Temp 98.1°F | Resp 16 | Ht 63.0 in | Wt 145.7 lb

## 2023-06-05 DIAGNOSIS — Z79899 Other long term (current) drug therapy: Secondary | ICD-10-CM

## 2023-06-05 DIAGNOSIS — F1021 Alcohol dependence, in remission: Secondary | ICD-10-CM | POA: Diagnosis not present

## 2023-06-05 DIAGNOSIS — E785 Hyperlipidemia, unspecified: Secondary | ICD-10-CM

## 2023-06-05 DIAGNOSIS — F3131 Bipolar disorder, current episode depressed, mild: Secondary | ICD-10-CM | POA: Diagnosis not present

## 2023-06-05 DIAGNOSIS — G8929 Other chronic pain: Secondary | ICD-10-CM

## 2023-06-05 DIAGNOSIS — E039 Hypothyroidism, unspecified: Secondary | ICD-10-CM

## 2023-06-05 DIAGNOSIS — M542 Cervicalgia: Secondary | ICD-10-CM

## 2023-06-05 DIAGNOSIS — K219 Gastro-esophageal reflux disease without esophagitis: Secondary | ICD-10-CM | POA: Diagnosis not present

## 2023-06-05 DIAGNOSIS — F5104 Psychophysiologic insomnia: Secondary | ICD-10-CM

## 2023-06-05 DIAGNOSIS — J432 Centrilobular emphysema: Secondary | ICD-10-CM

## 2023-06-05 DIAGNOSIS — Z8719 Personal history of other diseases of the digestive system: Secondary | ICD-10-CM | POA: Diagnosis not present

## 2023-06-05 DIAGNOSIS — Z862 Personal history of diseases of the blood and blood-forming organs and certain disorders involving the immune mechanism: Secondary | ICD-10-CM | POA: Diagnosis not present

## 2023-06-05 MED ORDER — LEVOTHYROXINE SODIUM 88 MCG PO TABS
ORAL_TABLET | ORAL | 1 refills | Status: DC
Start: 2023-06-05 — End: 2023-12-02

## 2023-06-05 MED ORDER — ESCITALOPRAM OXALATE 10 MG PO TABS
10.0000 mg | ORAL_TABLET | Freq: Every day | ORAL | 1 refills | Status: DC
Start: 2023-06-05 — End: 2023-12-10

## 2023-06-05 MED ORDER — PANTOPRAZOLE SODIUM 20 MG PO TBEC: 20.0000 mg | DELAYED_RELEASE_TABLET | Freq: Every day | ORAL | 1 refills | Status: DC

## 2023-06-05 MED ORDER — QUETIAPINE FUMARATE 25 MG PO TABS: 25.0000 mg | ORAL_TABLET | Freq: Every day | ORAL | 1 refills | Status: DC
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
## 2023-06-06 LAB — CBC WITH DIFFERENTIAL/PLATELET
Absolute Lymphocytes: 1180 {cells}/uL (ref 850–3900)
Absolute Monocytes: 181 {cells}/uL — ABNORMAL LOW (ref 200–950)
Basophils Absolute: 29 {cells}/uL (ref 0–200)
Basophils Relative: 0.7 %
Eosinophils Absolute: 38 {cells}/uL (ref 15–500)
Eosinophils Relative: 0.9 %
HCT: 40.2 % (ref 35.0–45.0)
Hemoglobin: 13.3 g/dL (ref 11.7–15.5)
MCH: 28.5 pg (ref 27.0–33.0)
MCHC: 33.1 g/dL (ref 32.0–36.0)
MCV: 86.3 fL (ref 80.0–100.0)
MPV: 12.1 fL (ref 7.5–12.5)
Monocytes Relative: 4.3 %
Neutro Abs: 2772 {cells}/uL (ref 1500–7800)
Neutrophils Relative %: 66 %
Platelets: 201 10*3/uL (ref 140–400)
RBC: 4.66 10*6/uL (ref 3.80–5.10)
RDW: 12.7 % (ref 11.0–15.0)
Total Lymphocyte: 28.1 %
WBC: 4.2 10*3/uL (ref 3.8–10.8)

## 2023-06-06 LAB — COMPLETE METABOLIC PANEL WITH GFR
AG Ratio: 1.5 (calc) (ref 1.0–2.5)
ALT: 22 U/L (ref 6–29)
AST: 19 U/L (ref 10–35)
Albumin: 4.3 g/dL (ref 3.6–5.1)
Alkaline phosphatase (APISO): 83 U/L (ref 37–153)
BUN: 21 mg/dL (ref 7–25)
CO2: 26 mmol/L (ref 20–32)
Calcium: 9.6 mg/dL (ref 8.6–10.4)
Chloride: 105 mmol/L (ref 98–110)
Creat: 0.85 mg/dL (ref 0.50–1.05)
Globulin: 2.9 g/dL (ref 1.9–3.7)
Glucose, Bld: 100 mg/dL — ABNORMAL HIGH (ref 65–99)
Potassium: 4.2 mmol/L (ref 3.5–5.3)
Sodium: 140 mmol/L (ref 135–146)
Total Bilirubin: 0.6 mg/dL (ref 0.2–1.2)
Total Protein: 7.2 g/dL (ref 6.1–8.1)
eGFR: 78 mL/min/{1.73_m2} (ref >=60)

## 2023-06-06 LAB — LIPID PANEL
Cholesterol: 190 mg/dL (ref <200)
HDL: 71 mg/dL (ref >=50)
LDL Cholesterol (Calc): 101 mg/dL — ABNORMAL HIGH
Non-HDL Cholesterol (Calc): 119 mg/dL (ref <130)
Total CHOL/HDL Ratio: 2.7 (calc) (ref <5.0)
Triglycerides: 85 mg/dL (ref <150)

## 2023-06-06 LAB — TSH: TSH: 0.35 m[IU]/L — ABNORMAL LOW (ref 0.40–4.50)

## 2023-06-12 ENCOUNTER — Ambulatory Visit: Payer: Medicare Other

## 2023-06-12 DIAGNOSIS — Z0000 Encounter for general adult medical examination without abnormal findings: Secondary | ICD-10-CM | POA: Diagnosis not present
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
## 2023-06-12 NOTE — Patient Instructions (Addendum)
Ms. Anna Giles , Thank you for taking time to come for your Medicare Wellness Visit. I appreciate your ongoing commitment to your health goals. Please review the following plan we discussed and let me know if I can assist you in the future.   Referrals/Orders/Follow-Ups/Clinician Recommendations: NONE  This is a list of the screening recommended for you and due dates:  Health Maintenance  Topic Date Due   COVID-19 Vaccine (6 - 2024-25 season) 06/21/2023*   Zoster (Shingles) Vaccine (1 of 2) 09/02/2023*   Screening for Lung Cancer  06/04/2024*   Pneumococcal Vaccination (1 of 2 - PCV) 06/04/2024*   Mammogram  12/19/2023   Medicare Annual Wellness Visit  06/11/2024   Cologuard (Stool DNA test)  11/08/2024   Pap with HPV screening  05/08/2027   Flu Shot  Completed   Hepatitis C Screening  Completed   HIV Screening  Completed   HPV Vaccine  Aged Out   DTaP/Tdap/Td vaccine  Discontinued   Colon Cancer Screening  Discontinued  *Topic was postponed. The date shown is not the original due date.    Advanced directives: (ACP Link)Information on Advanced Care Planning can be found at [facility] Advance Health Care Directives Advance Health Care Directives ([email or web address]   Next Medicare Annual Wellness Visit scheduled for next year: Yes   06/17/24 @ 2:30 BY PHONE

## 2023-07-09 ENCOUNTER — Ambulatory Visit: Payer: Medicare Other | Admitting: Family Medicine

## 2023-12-02 ENCOUNTER — Other Ambulatory Visit: Payer: Self-pay | Admitting: Family Medicine

## 2023-12-02 DIAGNOSIS — F3131 Bipolar disorder, current episode depressed, mild: Secondary | ICD-10-CM

## 2023-12-02 DIAGNOSIS — E039 Hypothyroidism, unspecified: Secondary | ICD-10-CM

## 2023-12-02 DIAGNOSIS — F5104 Psychophysiologic insomnia: Secondary | ICD-10-CM

## 2023-12-03 ENCOUNTER — Ambulatory Visit: Payer: Medicare Other | Admitting: Family Medicine

## 2023-12-10 ENCOUNTER — Ambulatory Visit (INDEPENDENT_AMBULATORY_CARE_PROVIDER_SITE_OTHER): Admitting: Family Medicine

## 2023-12-10 ENCOUNTER — Encounter: Payer: Self-pay | Admitting: Family Medicine

## 2023-12-10 VITALS — BP 118/74 | HR 75 | Resp 16 | Ht 63.0 in | Wt 132.8 lb

## 2023-12-10 DIAGNOSIS — J432 Centrilobular emphysema: Secondary | ICD-10-CM

## 2023-12-10 DIAGNOSIS — K219 Gastro-esophageal reflux disease without esophagitis: Secondary | ICD-10-CM

## 2023-12-10 DIAGNOSIS — F5104 Psychophysiologic insomnia: Secondary | ICD-10-CM

## 2023-12-10 DIAGNOSIS — G8929 Other chronic pain: Secondary | ICD-10-CM

## 2023-12-10 DIAGNOSIS — F3131 Bipolar disorder, current episode depressed, mild: Secondary | ICD-10-CM

## 2023-12-10 DIAGNOSIS — E039 Hypothyroidism, unspecified: Secondary | ICD-10-CM

## 2023-12-10 DIAGNOSIS — M542 Cervicalgia: Secondary | ICD-10-CM

## 2023-12-10 LAB — TSH: TSH: 0.14 m[IU]/L — ABNORMAL LOW (ref 0.40–4.50)

## 2023-12-10 MED ORDER — ESCITALOPRAM OXALATE 10 MG PO TABS: 10.0000 mg | ORAL_TABLET | Freq: Every day | ORAL | 1 refills | Status: AC

## 2023-12-10 MED ORDER — PANTOPRAZOLE SODIUM 20 MG PO TBEC: 20.0000 mg | DELAYED_RELEASE_TABLET | Freq: Every day | ORAL | 1 refills | Status: AC

## 2023-12-10 MED ORDER — QUETIAPINE FUMARATE 25 MG PO TABS
25.0000 mg | ORAL_TABLET | Freq: Every day | ORAL | 1 refills | Status: AC
Start: 2023-12-10 — End: ?

## 2023-12-10 NOTE — Progress Notes (Signed)
 Name: Anna Giles   MRN: [id number]    DOB: [birth year]   Date:12/10/2023       Progress Note  Subjective  Chief Complaint  Chief Complaint  Patient presents with   Medical Management of Chronic Issues   Discussed the use of AI scribe software for clinical note transcription with the patient, who gave verbal consent to proceed.  History of Present Illness Anna Giles is a 63 year old female with bipolar disorder and emphysema who presents for a follow-up visit.  She recently left her husband after 14 years of a strained relationship and has moved out ( separated ) and is living with a roommate.  She has a history of bipolar affective disorder, currently described as depressed, mild. She is taking Lexapro  and Seroquel  for management. Previously, she saw a counselor and psychiatrist but found the costs prohibitive. Despite having insurance, she is mindful of expenses. She walks daily with her dog, which she finds beneficial for her mental health.  She has a history of GERD and used to vomit frequently. She takes pantoprazole  every other day now and has not noticed any reflux symptoms recently. She is attempting to wean off the medication and is monitoring her diet.  She was diagnosed with emphysema in 2020 after a CT scan. She quit smoking in March 2020 and has not experienced coughing, wheezing, or shortness of breath. She is able to walk without issues.  She has hypothyroidism and takes thyroxine 88 micrograms daily, with a half dose on Sundays. Her last blood work in February showed suppressed levels and we will recheck labs today   She has a history of high cholesterol, but recent tests show improvement with HDL increasing to 71 and LDL decreasing to 101.  She was diagnosed with ADD in the past and previously took medication, but she is not currently on any treatment. She reports difficulty with concentration and staying on task, which she attributes to recent life changes.  She  experiences chronic neck pain and is under the care of a pain clinic. Her MS Contin  dosage was recently adjusted to 30 mg every 12 hours. She also takes oxycodone  and uses laxatives to manage constipation from opioid use.  She reports improved sleep since leaving her husband, although historically her sleep has been poor due to pain. She takes quetiapine  25 mg at night, which aids her sleep.    Patient Active Problem List   Diagnosis Date Noted   Centrilobular emphysema (HCC) 10/24/2021   History of gastritis 10/24/2021   Gastroesophageal reflux disease without esophagitis 10/24/2021   Bipolar affective disorder, currently depressed, mild (HCC) 10/24/2021   Hyperglycemia 02/09/2021   S/P cervical spinal fusion 04/02/2019   History of alcoholism (HCC) 12/28/2018   Dyslipidemia 07/24/2017   Alveolar aeration decreased 08/30/2014   Anxiety 08/30/2014   Bipolar 2 disorder (HCC) 08/30/2014   Chronic cervical pain 08/30/2014   Fatty liver disease, nonalcoholic 08/30/2014   Adult hypothyroidism 08/30/2014    Past Surgical History:  Procedure Laterality Date   ANTERIOR CERVICAL DECOMP/DISCECTOMY FUSION N/A 04/02/2019   Procedure: ANTERIOR CERVICAL DECOMPRESSION FUSION CERVICAL SIX-SEVEN, REMOVAL OF PLATE.;  Surgeon: Joshua Alm RAMAN, MD;  [facility];  Service: Neurosurgery;  Laterality: N/A;  anterior   bladder sugery     Age 36 or 6   NECK SURGERY  10/01/2005    Family History  Problem Relation Age of Onset   Depression Mother    Ulcers Sister    Breast  cancer Paternal Grandmother     Social History   Tobacco Use   Smoking status: Former    Current packs/day: 0.00    Average packs/day: 1 pack/day for 40.0 years (40.0 ttl pk-yrs)    Types: Cigarettes    Start date: 05/04/1983    Quit date: 07/05/2018    Years since quitting: 5.4   Smokeless tobacco: Never  Substance Use Topics   Alcohol use: Not Currently    Alcohol/week: 0.0 standard drinks of alcohol    Comment: used to  drink heavily , quit in 2007      Current Outpatient Medications:    Cholecalciferol (VITAMIN D3 PO), Take 1 tablet by mouth daily., Disp: , Rfl:    levothyroxine  (SYNTHROID ) 88 MCG tablet, TAKE 1 TABLET BY MOUTH DAILY MONDAY-SATURDAY, THEN 1/2 TABLET ON SUNDAYS, Disp: 30 tablet, Rfl: 0   MS CONTIN  30 MG 12 hr tablet, Take 30 mg by mouth every 12 (twelve) hours., Disp: , Rfl:    oxyCODONE  (OXY IR/ROXICODONE ) 5 MG immediate release tablet, Take 5 mg by mouth every 4 (four) hours as needed., Disp: , Rfl:    polyethylene glycol (MIRALAX / GLYCOLAX) 17 g packet, Take 17 g by mouth daily as needed (constipation). , Disp: , Rfl:    tiZANidine  (ZANAFLEX ) 4 MG tablet, Take 6 mg by mouth 3 (three) times daily. *scheduled*, Disp: , Rfl:    escitalopram  (LEXAPRO ) 10 MG tablet, Take 1 tablet (10 mg total) by mouth at bedtime., Disp: 90 tablet, Rfl: 1   pantoprazole  (PROTONIX ) 20 MG tablet, Take 1 tablet (20 mg total) by mouth daily., Disp: 90 tablet, Rfl: 1   QUEtiapine  (SEROQUEL ) 25 MG tablet, Take 1 tablet (25 mg total) by mouth at bedtime., Disp: 90 tablet, Rfl: 1  No Known Allergies  I personally reviewed active problem list, medication list, allergies with the patient/caregiver today.   ROS  Ten systems reviewed and is negative except as mentioned in HPI    Objective Physical Exam CONSTITUTIONAL: Patient appears well-developed and well-nourished.  No distress. HEENT: Head atraumatic, normocephalic, neck supple. CARDIOVASCULAR: Normal rate, regular rhythm and normal heart sounds.  No murmur heard. No BLE edema. PULMONARY: Effort normal and breath sounds normal. No respiratory distress. MUSCULOSKELETAL: Normal gait. Without gross motor or sensory deficit. PSYCHIATRIC: Patient has a normal mood and affect. behavior is normal. Judgment and thought content normal.  Vitals:   12/10/23 1108  BP: 118/74  Pulse: 75  Resp: 16  SpO2: 99%  Weight: 132 lb 12.8 oz (60.2 kg)  Height: 5' 3 (1.6 m)     Body mass index is 23.52 kg/m.    PHQ2/9:    12/10/2023   11:06 AM 06/12/2023    1:53 PM 06/05/2023   10:57 AM 12/09/2022    2:23 PM 06/07/2022   10:29 AM  Depression screen PHQ 2/9  Decreased Interest 0 1 1 1 1   Down, Depressed, Hopeless 0 1 1 1 1   PHQ - 2 Score 0 2 2 2 2   Altered sleeping 0 0 1 1 0  Tired, decreased energy 0 1 1 1 1   Change in appetite 0 0 0 0 0  Feeling bad or failure about yourself  0 0 0 1 0  Trouble concentrating 0 0 0 0 0  Moving slowly or fidgety/restless 0 0 0 0 0  Suicidal thoughts 0 0 0 0 0  PHQ-9 Score 0 3 4 5 3   Difficult doing work/chores Not difficult at all Not difficult  at all Somewhat difficult  Somewhat difficult    phq 9 is negative  Fall Risk:    12/10/2023   11:03 AM 06/12/2023    1:56 PM 06/05/2023   10:50 AM 12/09/2022    2:23 PM 06/07/2022   10:27 AM  Fall Risk   Falls in the past year? 0 0 0 0 0  Number falls in past yr: 0 0 0 0 0  Injury with Fall? 0 0 0 0 0  Risk for fall due to : No Fall Risks No Fall Risks No Fall Risks No Fall Risks No Fall Risks;Orthopedic patient;Medication side effect  Follow up Falls evaluation completed Falls prevention discussed;Falls evaluation completed Falls prevention discussed;Education provided;Falls evaluation completed Falls prevention discussed Education provided;Falls prevention discussed      Assessment & Plan Bipolar affective disorder, current episode depressed Managed with Lexapro  and Seroquel . She reported stability with current medications despite financial constraints limiting access to previous psychiatrist and counselor. - Continue Lexapro  10 mg daily. - Continue Seroquel  25 mg at night. - Refill prescriptions for Lexapro  and Seroquel .  Chronic neck pain Managed by pain clinic with recent medication adjustment to MS Contin  30 mg every 12 hours. She recently consulted her pain specialist. - Continue MS Contin  30 mg every 12 hours. - Continue oxycodone  as prescribed by pain  specialist. - Continue laxatives for opioid-induced constipation.  Hypothyroidism Managed with levothyroxine  88 mcg daily, with half dose on Sundays. Previous labs showed suppressed levels, but regimen continued due to flu. - Check thyroid  levels.  Gastroesophageal reflux disease (GERD) Previously managed with pantoprazole . She takes it every other day without reflux symptoms and is attempting to wean off. - Continue pantoprazole  as needed. - Monitor symptoms and adjust frequency of pantoprazole .  Emphysema, stable Diagnosed in 2020 with a small area affected. She quit smoking in March 2020. Financial constraints limit repeat CT scan. - Monitor for symptoms such as cough, wheeze, or shortness of breath.

## 2023-12-11 ENCOUNTER — Other Ambulatory Visit: Payer: Self-pay | Admitting: Family Medicine

## 2023-12-11 ENCOUNTER — Ambulatory Visit: Payer: Self-pay | Admitting: Family Medicine

## 2023-12-11 DIAGNOSIS — E039 Hypothyroidism, unspecified: Secondary | ICD-10-CM

## 2023-12-11 MED ORDER — LEVOTHYROXINE SODIUM 75 MCG PO TABS: 75.0000 ug | ORAL_TABLET | Freq: Every day | ORAL | 0 refills | Status: DC

## 2024-03-16 ENCOUNTER — Other Ambulatory Visit: Payer: Self-pay | Admitting: Family Medicine

## 2024-03-17 NOTE — Telephone Encounter (Signed)
 Tried to call pt no answer unable to leave vm. Pt needs to re check TSH before refill.

## 2024-03-17 NOTE — Telephone Encounter (Signed)
 Second request

## 2024-04-05 ENCOUNTER — Other Ambulatory Visit: Payer: Self-pay | Admitting: Family Medicine

## 2024-04-06 NOTE — Telephone Encounter (Signed)
 Had sent mychart message but does not have Mychart active. Called pt no answer left detailed vm to come in for labs.

## 2024-04-06 NOTE — Telephone Encounter (Signed)
 Second request

## 2024-04-19 ENCOUNTER — Telehealth: Payer: Self-pay

## 2024-04-19 ENCOUNTER — Other Ambulatory Visit: Payer: Self-pay | Admitting: Family Medicine

## 2024-04-19 NOTE — Telephone Encounter (Signed)
 Ok to provide short supply till next week?

## 2024-04-19 NOTE — Telephone Encounter (Signed)
 Copied from CRM #[id number]. Topic: Clinical - Prescription Issue >> Apr 19, 2024 12:41 PM Joesph B wrote: Reason for CRM: patient was advised she needs a lab appt before she can get a refill on her levothyroxine  (SYNTHROID ) 75 MCG tablet. She said she runs out tomorrow and will not be able to come in for blood work until after next week. She is returning Mashell Sieben phone call. Please fu with pt.  ,

## 2024-04-20 ENCOUNTER — Other Ambulatory Visit: Payer: Self-pay | Admitting: Anesthesiology

## 2024-04-20 DIAGNOSIS — Z981 Arthrodesis status: Secondary | ICD-10-CM

## 2024-04-20 MED ORDER — LEVOTHYROXINE SODIUM 75 MCG PO TABS: 75.0000 ug | ORAL_TABLET | Freq: Every day | ORAL | 0 refills | Status: DC

## 2024-04-20 NOTE — Addendum Note (Signed)
 Addended by: RENTERIA-GARCIA, Kaniel Kiang on: 04/20/2024 08:23 AM   Modules accepted: Orders

## 2024-04-20 NOTE — Telephone Encounter (Signed)
 One week of supply given, pt notified to come in next week for lab

## 2024-04-21 NOTE — Telephone Encounter (Signed)
 Duplicate request- filled 12/23 #7- over due for lab repeat per office Requested Prescriptions  Pending Prescriptions Disp Refills   levothyroxine  (SYNTHROID ) 75 MCG tablet [Pharmacy Med Name: LEVOTHYROXINE  75 MCG TABLET] 90 tablet 0    Sig: TAKE 1 TABLET BY MOUTH DAILY     Endocrinology:  Hypothyroid Agents Failed - 04/21/2024 12:36 PM      Failed - TSH in normal range and within 360 days    TSH  Date Value Ref Range Status  12/10/2023 0.14 (L) 0.40 - 4.50 mIU/L Final         Passed - Valid encounter within last 12 months    Recent Outpatient Visits           4 months ago Bipolar affective disorder, currently depressed, mild [facility])   [place] [facility] Glenard Mire, MD   10 months ago Centrilobular emphysema [facility])   [facility] Glenard Mire, MD       Future Appointments             In 2 weeks Sowles, Krichna, MD [facility], [place]   In 1 month Glenard, Krichna, MD [facility], [place]

## 2024-04-23 ENCOUNTER — Other Ambulatory Visit: Payer: Self-pay | Admitting: Family Medicine

## 2024-04-26 ENCOUNTER — Ambulatory Visit
Admission: RE | Admit: 2024-04-26 | Discharge: 2024-04-26 | Disposition: A | Discharge status: Home / Self Care | Source: Ambulatory Visit | Attending: Anesthesiology | Admitting: Anesthesiology

## 2024-04-26 DIAGNOSIS — Z981 Arthrodesis status: Secondary | ICD-10-CM | POA: Insufficient documentation

## 2024-04-26 MED ORDER — GADOBUTROL 1 MMOL/ML IV SOLN
6.0000 mL | Freq: Once | INTRAVENOUS | Status: AC | PRN
  Administered 2024-04-26: 6 mL via INTRAVENOUS

## 2024-04-26 NOTE — Telephone Encounter (Signed)
 Requested medication (s) are due for refill today: yes  Requested medication (s) are on the active medication list: yes  Last refill:  04/20/24  Future visit scheduled: yes  Notes to clinic:  Unable to refill per protocol due to failed labs, no updated results.      Requested Prescriptions  Pending Prescriptions Disp Refills   levothyroxine  (SYNTHROID ) 75 MCG tablet [Pharmacy Med Name: LEVOTHYROXINE  75 MCG TABLET] 7 tablet 0    Sig: TAKE 1 TABLET BY MOUTH DAILY     Endocrinology:  Hypothyroid Agents Failed - 04/26/2024 12:05 PM      Failed - TSH in normal range and within 360 days    TSH  Date Value Ref Range Status  12/10/2023 0.14 (L) 0.40 - 4.50 mIU/L Final         Passed - Valid encounter within last 12 months    Recent Outpatient Visits           4 months ago Bipolar affective disorder, currently depressed, mild [facility])   [place] [facility] Glenard Mire, MD   10 months ago Centrilobular emphysema [facility])   [facility] Glenard Mire, MD       Future Appointments             In 2 weeks Sowles, Krichna, MD [facility], [place]   In 1 month Glenard, Krichna, MD [facility], [place]

## 2024-04-27 ENCOUNTER — Ambulatory Visit: Payer: Self-pay | Admitting: Family Medicine

## 2024-04-27 ENCOUNTER — Other Ambulatory Visit: Payer: Self-pay

## 2024-04-27 LAB — TSH: TSH: 0.87 m[IU]/L (ref 0.40–4.50)

## 2024-04-27 MED ORDER — LEVOTHYROXINE SODIUM 75 MCG PO TABS: 75.0000 ug | ORAL_TABLET | Freq: Every day | ORAL | 0 refills | Status: AC

## 2024-05-10 NOTE — Patient Instructions (Incomplete)
 Preventive Care 63-63 Years Old, Female  Preventive care refers to lifestyle choices and visits with your health care provider that can promote health and wellness. Preventive care visits are also called wellness exams.  What can I expect for my preventive care visit?  Counseling  Your health care provider may ask you questions about your:  Medical history, including:  Past medical problems.  Family medical history.  Pregnancy history.  Current health, including:  Menstrual cycle.  Method of birth control.  Emotional well-being.  Home life and relationship well-being.  Sexual activity and sexual health.  Lifestyle, including:  Alcohol, nicotine or tobacco, and drug use.  Access to firearms.  Diet, exercise, and sleep habits.  Work and work Astron[employer].  Sunscreen use.  Safety issues such as seatbelt and bike helmet use.  Physical exam  Your health care provider will check your:  Height and weight. These may be used to calculate your BMI (body mass index). BMI is a measurement that tells if you are at a healthy weight.  Waist circumference. This measures the distance around your waistline. This measurement also tells if you are at a healthy weight and may help predict your risk of certain diseases, such as type 2 diabetes and high blood pressure.  Heart rate and blood pressure.  Body temperature.  Skin for abnormal spots.  What immunizations do I need?    Vaccines are usually given at various ages, according to a schedule. Your health care provider will recommend vaccines for you based on your age, medical history, and lifestyle or other factors, such as travel or where you work.  What tests do I need?  Screening  Your health care provider may recommend screening tests for certain conditions. This may include:  Lipid and cholesterol levels.  Diabetes screening. This is done by checking your blood sugar (glucose) after you have not eaten for a while (fasting).  Pelvic exam and Pap test.  Hepatitis B test.  Hepatitis C  test.  HIV (human immunodeficiency virus) test.  STI (sexually transmitted infection) testing, if you are at risk.  Lung cancer screening.  Colorectal cancer screening.  Mammogram. Talk with your health care provider about when you should start having regular mammograms. This may depend on whether you have a family history of breast cancer.  BRCA-related cancer screening. This may be done if you have a family history of breast, ovarian, tubal, or peritoneal cancers.  Bone density scan. This is done to screen for osteoporosis.  Talk with your health care provider about your test results, treatment options, and if necessary, the need for more tests.  Follow these instructions at home:  Eating and drinking    Eat a diet that includes fresh fruits and vegetables, whole grains, lean protein, and low-fat dairy products.  Take vitamin and mineral supplements as recommended by your health care provider.  Do not drink alcohol if:  Your health care provider tells you not to drink.  You are pregnant, may be pregnant, or are planning to become pregnant.  If you drink alcohol:  Limit how much you have to 0-1 drink a day.  Know how much alcohol is in your drink. In the [place] one drink equals one 12 oz bottle of beer (355 mL), one 5 oz glass of wine (148 mL), or one 1 oz glass of hard liquor (44 mL).  Lifestyle  Brush your teeth every morning and night with fluoride toothpaste. Floss one time each day.  Exercise for at least  30 minutes 5 or more days each week.  Do not use any products that contain nicotine or tobacco. These products include cigarettes, chewing tobacco, and vaping devices, such as e-cigarettes. If you need help quitting, ask your health care provider.  Do not use drugs.  If you are sexually active, practice safe sex. Use a condom or other form of protection to prevent STIs.  If you do not wish to become pregnant, use a form of birth control. If you plan to become pregnant, see your health care provider for a  prepregnancy visit.  Take aspirin only as told by your health care provider. Make sure that you understand how much to take and what form to take. Work with your health care provider to find out whether it is safe and beneficial for you to take aspirin daily.  Find healthy ways to manage stress, such as:  Meditation, yoga, or listening to music.  Journaling.  Talking to a trusted person.  Spending time with friends and family.  Minimize exposure to UV radiation to reduce your risk of skin cancer.  Safety  Always wear your seat belt while driving or riding in a vehicle.  Do not drive:  If you have been drinking alcohol. Do not ride with someone who has been drinking.  When you are tired or distracted.  While texting.  If you have been using any mind-altering substances or drugs.  Wear a helmet and other protective equipment during sports activities.  If you have firearms in your house, make sure you follow all gun safety procedures.  Seek help if you have been physically or sexually abused.  What's next?  Visit your health care provider once a year for an annual wellness visit.  Ask your health care provider how often you should have your eyes and teeth checked.  Stay up to date on all vaccines.  This information is not intended to replace advice given to you by your health care provider. Make sure you discuss any questions you have with your health care provider.  Document Revised: 10/11/2020 Document Reviewed: 10/11/2020  Elsevier Patient Education  2024 [employer].

## 2024-05-11 ENCOUNTER — Encounter: Admitting: Family Medicine

## 2024-06-11 ENCOUNTER — Ambulatory Visit: Admitting: Family Medicine

## 2024-06-17 ENCOUNTER — Ambulatory Visit: Payer: Medicare Other
